# Patient Record
Sex: Female | Born: 1950
Health system: Southern US, Community
[De-identification: ages and names within clinical notes are randomized; demographics above are authoritative.]

## PROBLEM LIST (undated history)

## (undated) DIAGNOSIS — G40909 Epilepsy, unspecified, not intractable, without status epilepticus: Secondary | ICD-10-CM

## (undated) DIAGNOSIS — Z9289 Personal history of other medical treatment: Secondary | ICD-10-CM

## (undated) DIAGNOSIS — K219 Gastro-esophageal reflux disease without esophagitis: Secondary | ICD-10-CM

## (undated) DIAGNOSIS — M19042 Primary osteoarthritis, left hand: Secondary | ICD-10-CM

## (undated) DIAGNOSIS — R011 Cardiac murmur, unspecified: Secondary | ICD-10-CM

## (undated) DIAGNOSIS — K589 Irritable bowel syndrome without diarrhea: Secondary | ICD-10-CM

## (undated) DIAGNOSIS — E785 Hyperlipidemia, unspecified: Secondary | ICD-10-CM

## (undated) DIAGNOSIS — Z9089 Acquired absence of other organs: Secondary | ICD-10-CM

## (undated) DIAGNOSIS — F419 Anxiety disorder, unspecified: Secondary | ICD-10-CM

## (undated) DIAGNOSIS — M19041 Primary osteoarthritis, right hand: Secondary | ICD-10-CM

## (undated) DIAGNOSIS — E042 Nontoxic multinodular goiter: Secondary | ICD-10-CM

## (undated) DIAGNOSIS — J309 Allergic rhinitis, unspecified: Secondary | ICD-10-CM

## (undated) DIAGNOSIS — I10 Essential (primary) hypertension: Secondary | ICD-10-CM

## (undated) DIAGNOSIS — E890 Postprocedural hypothyroidism: Secondary | ICD-10-CM

## (undated) HISTORY — DX: Gastro-esophageal reflux disease without esophagitis: K21.9

## (undated) HISTORY — DX: Allergic rhinitis, unspecified: J30.9

## (undated) HISTORY — DX: Postprocedural hypothyroidism: E89.0

## (undated) HISTORY — DX: Nontoxic multinodular goiter: E04.2

## (undated) HISTORY — DX: Epilepsy, unspecified, not intractable, without status epilepticus: G40.909

## (undated) HISTORY — DX: Essential (primary) hypertension: I10

## (undated) HISTORY — DX: Irritable bowel syndrome, unspecified: K58.9

## (undated) HISTORY — DX: Personal history of other medical treatment: Z92.89

## (undated) HISTORY — DX: Hyperlipidemia, unspecified: E78.5

## (undated) HISTORY — DX: Cardiac murmur, unspecified: R01.1

## (undated) HISTORY — DX: Acquired absence of other organs: Z90.89

## (undated) HISTORY — PX: COLONOSCOPY: SHX174

## (undated) HISTORY — DX: Anxiety disorder, unspecified: F41.9

## (undated) HISTORY — DX: Primary osteoarthritis, left hand: M19.042

## (undated) HISTORY — DX: Primary osteoarthritis, right hand: M19.041

---

## 1972-06-16 HISTORY — PX: OTHER SURGICAL HISTORY: SHX169

## 1975-06-17 DIAGNOSIS — G40909 Epilepsy, unspecified, not intractable, without status epilepticus: Secondary | ICD-10-CM

## 1975-06-17 HISTORY — DX: Epilepsy, unspecified, not intractable, without status epilepticus: G40.909

## 1999-06-12 ENCOUNTER — Other Ambulatory Visit: Admission: RE | Admit: 1999-06-12 | Discharge: 1999-06-12 | Payer: Self-pay | Admitting: Obstetrics & Gynecology

## 1999-11-08 ENCOUNTER — Encounter: Payer: Self-pay | Admitting: Internal Medicine

## 1999-11-27 ENCOUNTER — Other Ambulatory Visit: Admission: RE | Admit: 1999-11-27 | Discharge: 1999-11-27 | Payer: Self-pay | Admitting: Gastroenterology

## 1999-12-23 ENCOUNTER — Encounter: Admission: RE | Admit: 1999-12-23 | Discharge: 2000-03-22 | Payer: Self-pay | Admitting: Gastroenterology

## 2000-08-14 ENCOUNTER — Other Ambulatory Visit: Admission: RE | Admit: 2000-08-14 | Discharge: 2000-08-14 | Payer: Self-pay | Admitting: Obstetrics & Gynecology

## 2001-10-07 ENCOUNTER — Other Ambulatory Visit: Admission: RE | Admit: 2001-10-07 | Discharge: 2001-10-07 | Payer: Self-pay | Admitting: Obstetrics & Gynecology

## 2002-10-17 ENCOUNTER — Other Ambulatory Visit: Admission: RE | Admit: 2002-10-17 | Discharge: 2002-10-17 | Payer: Self-pay | Admitting: Obstetrics & Gynecology

## 2003-06-15 ENCOUNTER — Encounter: Admission: RE | Admit: 2003-06-15 | Discharge: 2003-06-15 | Payer: Self-pay | Admitting: Internal Medicine

## 2003-11-23 ENCOUNTER — Other Ambulatory Visit: Admission: RE | Admit: 2003-11-23 | Discharge: 2003-11-23 | Payer: Self-pay | Admitting: Obstetrics & Gynecology

## 2004-05-01 ENCOUNTER — Ambulatory Visit: Payer: Self-pay | Admitting: Internal Medicine

## 2004-05-27 ENCOUNTER — Ambulatory Visit: Payer: Self-pay | Admitting: Internal Medicine

## 2004-06-07 ENCOUNTER — Ambulatory Visit: Payer: Self-pay | Admitting: Gastroenterology

## 2004-10-29 ENCOUNTER — Ambulatory Visit: Payer: Self-pay | Admitting: Internal Medicine

## 2005-01-21 ENCOUNTER — Ambulatory Visit: Payer: Self-pay | Admitting: Gastroenterology

## 2005-03-25 ENCOUNTER — Other Ambulatory Visit: Admission: RE | Admit: 2005-03-25 | Discharge: 2005-03-25 | Payer: Self-pay | Admitting: Obstetrics & Gynecology

## 2005-09-15 ENCOUNTER — Ambulatory Visit: Payer: Self-pay | Admitting: Gastroenterology

## 2005-10-01 ENCOUNTER — Ambulatory Visit: Payer: Self-pay | Admitting: Gastroenterology

## 2006-06-16 HISTORY — PX: OTHER SURGICAL HISTORY: SHX169

## 2006-09-16 ENCOUNTER — Ambulatory Visit: Payer: Self-pay | Admitting: Internal Medicine

## 2006-09-16 LAB — CONVERTED CEMR LAB
Free T4: 0.6 ng/dL (ref 0.6–1.6)
T3, Free: 3.3 pg/mL (ref 2.3–4.2)
TSH: 1.84 microintl units/mL (ref 0.35–5.50)

## 2006-12-16 ENCOUNTER — Encounter: Payer: Self-pay | Admitting: Internal Medicine

## 2007-03-09 ENCOUNTER — Ambulatory Visit: Payer: Self-pay | Admitting: Internal Medicine

## 2007-03-09 DIAGNOSIS — E049 Nontoxic goiter, unspecified: Secondary | ICD-10-CM | POA: Insufficient documentation

## 2007-03-09 DIAGNOSIS — J309 Allergic rhinitis, unspecified: Secondary | ICD-10-CM | POA: Insufficient documentation

## 2007-03-12 ENCOUNTER — Encounter (INDEPENDENT_AMBULATORY_CARE_PROVIDER_SITE_OTHER): Payer: Self-pay | Admitting: *Deleted

## 2007-03-23 ENCOUNTER — Encounter: Admission: RE | Admit: 2007-03-23 | Discharge: 2007-03-23 | Payer: Self-pay | Admitting: Internal Medicine

## 2007-03-24 ENCOUNTER — Encounter (INDEPENDENT_AMBULATORY_CARE_PROVIDER_SITE_OTHER): Payer: Self-pay | Admitting: *Deleted

## 2007-04-05 ENCOUNTER — Ambulatory Visit: Payer: Self-pay | Admitting: Internal Medicine

## 2007-04-06 ENCOUNTER — Encounter (INDEPENDENT_AMBULATORY_CARE_PROVIDER_SITE_OTHER): Payer: Self-pay | Admitting: *Deleted

## 2007-05-27 ENCOUNTER — Telehealth (INDEPENDENT_AMBULATORY_CARE_PROVIDER_SITE_OTHER): Payer: Self-pay | Admitting: *Deleted

## 2007-08-20 ENCOUNTER — Ambulatory Visit: Payer: Self-pay | Admitting: Internal Medicine

## 2007-08-20 DIAGNOSIS — D649 Anemia, unspecified: Secondary | ICD-10-CM

## 2007-08-24 LAB — CONVERTED CEMR LAB
Basophils Absolute: 0 10*3/uL (ref 0.0–0.1)
Basophils Relative: 0.3 % (ref 0.0–1.0)
Eosinophils Absolute: 0.1 10*3/uL (ref 0.0–0.6)
Eosinophils Relative: 1.5 % (ref 0.0–5.0)
Folate: 17.6 ng/mL
HCT: 33.2 % — ABNORMAL LOW (ref 36.0–46.0)
Hemoglobin: 11 g/dL — ABNORMAL LOW (ref 12.0–15.0)
Iron: 79 ug/dL (ref 42–145)
Lymphocytes Relative: 34.9 % (ref 12.0–46.0)
MCHC: 33 g/dL (ref 30.0–36.0)
MCV: 87.2 fL (ref 78.0–100.0)
Monocytes Absolute: 0.3 10*3/uL (ref 0.2–0.7)
Monocytes Relative: 8 % (ref 3.0–11.0)
Neutro Abs: 2.4 10*3/uL (ref 1.4–7.7)
Neutrophils Relative %: 55.3 % (ref 43.0–77.0)
Platelets: 190 10*3/uL (ref 150–400)
RBC: 3.81 M/uL — ABNORMAL LOW (ref 3.87–5.11)
RDW: 11.7 % (ref 11.5–14.6)
Saturation Ratios: 26.3 % (ref 20.0–50.0)
Transferrin: 214.4 mg/dL (ref >=212.0)
Vitamin B-12: 427 pg/mL (ref 211–911)
WBC: 4.3 10*3/uL — ABNORMAL LOW (ref 4.5–10.5)

## 2007-08-25 ENCOUNTER — Encounter (INDEPENDENT_AMBULATORY_CARE_PROVIDER_SITE_OTHER): Payer: Self-pay | Admitting: *Deleted

## 2007-11-16 ENCOUNTER — Ambulatory Visit: Payer: Self-pay | Admitting: Internal Medicine

## 2007-11-16 ENCOUNTER — Telehealth (INDEPENDENT_AMBULATORY_CARE_PROVIDER_SITE_OTHER): Payer: Self-pay | Admitting: *Deleted

## 2007-11-16 DIAGNOSIS — R002 Palpitations: Secondary | ICD-10-CM

## 2007-11-17 ENCOUNTER — Encounter (INDEPENDENT_AMBULATORY_CARE_PROVIDER_SITE_OTHER): Payer: Self-pay | Admitting: *Deleted

## 2007-11-17 ENCOUNTER — Telehealth: Payer: Self-pay | Admitting: Internal Medicine

## 2007-11-29 ENCOUNTER — Ambulatory Visit: Payer: Self-pay

## 2007-11-29 ENCOUNTER — Encounter: Payer: Self-pay | Admitting: Internal Medicine

## 2007-11-29 ENCOUNTER — Telehealth: Payer: Self-pay | Admitting: Internal Medicine

## 2008-02-01 ENCOUNTER — Encounter: Payer: Self-pay | Admitting: Internal Medicine

## 2008-03-15 ENCOUNTER — Ambulatory Visit: Payer: Self-pay | Admitting: Internal Medicine

## 2008-03-15 DIAGNOSIS — Z8659 Personal history of other mental and behavioral disorders: Secondary | ICD-10-CM

## 2008-03-15 DIAGNOSIS — K219 Gastro-esophageal reflux disease without esophagitis: Secondary | ICD-10-CM

## 2008-03-15 DIAGNOSIS — D568 Other thalassemias: Secondary | ICD-10-CM

## 2008-03-15 DIAGNOSIS — E785 Hyperlipidemia, unspecified: Secondary | ICD-10-CM | POA: Insufficient documentation

## 2008-03-15 LAB — CONVERTED CEMR LAB
Cholesterol, target level: 200 mg/dL
HDL goal, serum: 40 mg/dL
LDL Goal: 160 mg/dL

## 2008-03-24 ENCOUNTER — Encounter (INDEPENDENT_AMBULATORY_CARE_PROVIDER_SITE_OTHER): Payer: Self-pay | Admitting: *Deleted

## 2008-03-24 LAB — CONVERTED CEMR LAB
ALT: 24 units/L (ref 0–35)
AST: 32 units/L (ref 0–37)
Albumin: 3.9 g/dL (ref 3.5–5.2)
Alkaline Phosphatase: 61 units/L (ref 39–117)
BUN: 19 mg/dL (ref 6–23)
Basophils Absolute: 0 10*3/uL (ref 0.0–0.1)
Basophils Relative: 0.8 % (ref 0.0–3.0)
Bilirubin, Direct: 0.1 mg/dL (ref 0.0–0.3)
CO2: 27 meq/L (ref 19–32)
Calcium: 9.3 mg/dL (ref 8.4–10.5)
Chloride: 102 meq/L (ref 96–112)
Cholesterol: 239 mg/dL (ref 0–200)
Creatinine, Ser: 0.8 mg/dL (ref 0.4–1.2)
Direct LDL: 168.1 mg/dL
Eosinophils Absolute: 0.1 10*3/uL (ref 0.0–0.7)
Eosinophils Relative: 1.6 % (ref 0.0–5.0)
Free T4: 0.8 ng/dL (ref 0.6–1.6)
GFR calc Af Amer: 94 mL/min
GFR calc non Af Amer: 78 mL/min
Glucose, Bld: 91 mg/dL (ref 70–99)
HCT: 35.6 % — ABNORMAL LOW (ref 36.0–46.0)
HDL: 53.7 mg/dL (ref >39.0)
Hemoglobin: 12.4 g/dL (ref 12.0–15.0)
Lymphocytes Relative: 35.1 % (ref 12.0–46.0)
MCHC: 34.8 g/dL (ref 30.0–36.0)
MCV: 86.3 fL (ref 78.0–100.0)
Monocytes Absolute: 0.4 10*3/uL (ref 0.1–1.0)
Monocytes Relative: 8.2 % (ref 3.0–12.0)
Neutro Abs: 2.6 10*3/uL (ref 1.4–7.7)
Neutrophils Relative %: 54.3 % (ref 43.0–77.0)
Platelets: 215 10*3/uL (ref 150–400)
Potassium: 4.5 meq/L (ref 3.5–5.1)
RBC: 4.13 M/uL (ref 3.87–5.11)
RDW: 11.7 % (ref 11.5–14.6)
Sodium: 138 meq/L (ref 135–145)
T3, Free: 3 pg/mL (ref 2.3–4.2)
TSH: 2.55 microintl units/mL (ref 0.35–5.50)
Total Bilirubin: 0.6 mg/dL (ref 0.3–1.2)
Total CHOL/HDL Ratio: 4.5
Total Protein: 7.4 g/dL (ref 6.0–8.3)
Triglycerides: 88 mg/dL (ref 0–149)
VLDL: 18 mg/dL (ref 0–40)
WBC: 4.8 10*3/uL (ref 4.5–10.5)

## 2008-04-03 ENCOUNTER — Encounter: Admission: RE | Admit: 2008-04-03 | Discharge: 2008-04-03 | Payer: Self-pay | Admitting: Internal Medicine

## 2008-04-06 ENCOUNTER — Telehealth (INDEPENDENT_AMBULATORY_CARE_PROVIDER_SITE_OTHER): Payer: Self-pay | Admitting: *Deleted

## 2008-05-01 ENCOUNTER — Ambulatory Visit: Payer: Self-pay | Admitting: Internal Medicine

## 2008-05-09 ENCOUNTER — Encounter (INDEPENDENT_AMBULATORY_CARE_PROVIDER_SITE_OTHER): Payer: Self-pay | Admitting: *Deleted

## 2008-05-09 LAB — CONVERTED CEMR LAB
Basophils Absolute: 0 10*3/uL (ref 0.0–0.1)
Basophils Relative: 0.8 % (ref 0.0–3.0)
Eosinophils Absolute: 0.1 10*3/uL (ref 0.0–0.7)
Eosinophils Relative: 1.2 % (ref 0.0–5.0)
Folate: >20 ng/mL
HCT: 32.7 % — ABNORMAL LOW (ref 36.0–46.0)
Hemoglobin: 11.4 g/dL — ABNORMAL LOW (ref 12.0–15.0)
Iron: 60 ug/dL (ref 42–145)
Lymphocytes Relative: 31.5 % (ref 12.0–46.0)
MCHC: 34.9 g/dL (ref 30.0–36.0)
MCV: 87.1 fL (ref 78.0–100.0)
Monocytes Absolute: 0.4 10*3/uL (ref 0.1–1.0)
Monocytes Relative: 7.9 % (ref 3.0–12.0)
Neutro Abs: 2.9 10*3/uL (ref 1.4–7.7)
Neutrophils Relative %: 58.6 % (ref 43.0–77.0)
Platelets: 176 10*3/uL (ref 150–400)
RBC: 3.75 M/uL — ABNORMAL LOW (ref 3.87–5.11)
RDW: 11.6 % (ref 11.5–14.6)
Vitamin B-12: 370 pg/mL (ref 211–911)
WBC: 5 10*3/uL (ref 4.5–10.5)

## 2008-06-01 ENCOUNTER — Ambulatory Visit: Payer: Self-pay | Admitting: Internal Medicine

## 2008-06-15 ENCOUNTER — Telehealth (INDEPENDENT_AMBULATORY_CARE_PROVIDER_SITE_OTHER): Payer: Self-pay | Admitting: *Deleted

## 2008-09-08 ENCOUNTER — Telehealth (INDEPENDENT_AMBULATORY_CARE_PROVIDER_SITE_OTHER): Payer: Self-pay | Admitting: *Deleted

## 2008-12-12 ENCOUNTER — Ambulatory Visit: Payer: Self-pay | Admitting: Internal Medicine

## 2009-02-05 ENCOUNTER — Telehealth (INDEPENDENT_AMBULATORY_CARE_PROVIDER_SITE_OTHER): Payer: Self-pay | Admitting: *Deleted

## 2009-02-09 ENCOUNTER — Telehealth (INDEPENDENT_AMBULATORY_CARE_PROVIDER_SITE_OTHER): Payer: Self-pay | Admitting: *Deleted

## 2009-07-23 ENCOUNTER — Ambulatory Visit: Payer: Self-pay | Admitting: Internal Medicine

## 2009-07-23 ENCOUNTER — Encounter: Payer: Self-pay | Admitting: Family Medicine

## 2010-03-20 ENCOUNTER — Encounter: Payer: Self-pay | Admitting: Internal Medicine

## 2010-07-05 ENCOUNTER — Ambulatory Visit
Admission: RE | Admit: 2010-07-05 | Discharge: 2010-07-05 | Payer: Self-pay | Source: Home / Self Care | Attending: Internal Medicine | Admitting: Internal Medicine

## 2010-07-05 ENCOUNTER — Encounter: Payer: Self-pay | Admitting: Internal Medicine

## 2010-07-05 DIAGNOSIS — R569 Unspecified convulsions: Secondary | ICD-10-CM | POA: Insufficient documentation

## 2010-07-05 DIAGNOSIS — H9319 Tinnitus, unspecified ear: Secondary | ICD-10-CM | POA: Insufficient documentation

## 2010-07-05 DIAGNOSIS — R1319 Other dysphagia: Secondary | ICD-10-CM | POA: Insufficient documentation

## 2010-07-05 DIAGNOSIS — M531 Cervicobrachial syndrome: Secondary | ICD-10-CM | POA: Insufficient documentation

## 2010-07-08 LAB — CONVERTED CEMR LAB
BUN: 18 mg/dL (ref 6–23)
CO2: 25 meq/L (ref 19–32)
Calcium: 9.6 mg/dL (ref 8.4–10.5)
Chloride: 104 meq/L (ref 96–112)
Creatinine, Ser: 0.72 mg/dL (ref 0.40–1.20)
Free T4: 0.93 ng/dL (ref 0.80–1.80)
Glucose, Bld: 90 mg/dL (ref 70–99)
Potassium: 4.4 meq/L (ref 3.5–5.3)
Sodium: 139 meq/L (ref 135–145)
TSH: 4.065 microintl units/mL (ref 0.350–4.500)
Vitamin B-12: 451 pg/mL (ref 211–911)

## 2010-07-14 LAB — CONVERTED CEMR LAB
ALT: 19 units/L (ref 0–35)
AST: 26 units/L (ref 0–37)
Albumin: 3.8 g/dL (ref 3.5–5.2)
Alkaline Phosphatase: 62 units/L (ref 39–117)
BUN: 15 mg/dL (ref 6–23)
BUN: 16 mg/dL (ref 6–23)
Basophils Absolute: 0 10*3/uL (ref 0.0–0.1)
Basophils Absolute: 0.1 10*3/uL (ref 0.0–0.1)
Basophils Relative: 0.5 % (ref 0.0–1.0)
Basophils Relative: 1 % (ref 0.0–1.0)
Bilirubin, Direct: 0.1 mg/dL (ref 0.0–0.3)
CO2: 31 meq/L (ref 19–32)
CO2: 31 meq/L (ref 19–32)
Calcium: 8.6 mg/dL (ref 8.4–10.5)
Calcium: 9.7 mg/dL (ref 8.4–10.5)
Chloride: 105 meq/L (ref 96–112)
Chloride: 114 meq/L — ABNORMAL HIGH (ref 96–112)
Cholesterol: 218 mg/dL (ref 0–200)
Creatinine, Ser: 0.8 mg/dL (ref 0.4–1.2)
Creatinine, Ser: 1 mg/dL (ref 0.4–1.2)
Direct LDL: 144.9 mg/dL
Eosinophils Absolute: 0.1 10*3/uL (ref 0.0–0.6)
Eosinophils Absolute: 0.1 10*3/uL (ref 0.0–0.7)
Eosinophils Relative: 0.9 % (ref 0.0–5.0)
Eosinophils Relative: 1.4 % (ref 0.0–5.0)
Free T4: 0.6 ng/dL (ref 0.6–1.6)
Free T4: 0.8 ng/dL (ref 0.6–1.6)
GFR calc Af Amer: 73 mL/min
GFR calc Af Amer: 95 mL/min
GFR calc non Af Amer: 60 mL/min
GFR calc non Af Amer: 78 mL/min
Glucose, Bld: 83 mg/dL (ref 70–99)
Glucose, Bld: 98 mg/dL (ref 70–99)
HCT: 34 % — ABNORMAL LOW (ref 36.0–46.0)
HCT: 34 % — ABNORMAL LOW (ref 36.0–46.0)
HDL: 50.8 mg/dL (ref >39.0)
Hemoglobin: 11.8 g/dL — ABNORMAL LOW (ref 12.0–15.0)
Hemoglobin: 12.1 g/dL (ref 12.0–15.0)
Lymphocytes Relative: 32.7 % (ref 12.0–46.0)
Lymphocytes Relative: 34.5 % (ref 12.0–46.0)
MCHC: 34.7 g/dL (ref 30.0–36.0)
MCHC: 35.5 g/dL (ref 30.0–36.0)
MCV: 85.8 fL (ref 78.0–100.0)
MCV: 86.7 fL (ref 78.0–100.0)
Monocytes Absolute: 0.1 10*3/uL (ref 0.1–1.0)
Monocytes Absolute: 0.5 10*3/uL (ref 0.2–0.7)
Monocytes Relative: 2 % — ABNORMAL LOW (ref 3.0–12.0)
Monocytes Relative: 9 % (ref 3.0–11.0)
Neutro Abs: 2.8 10*3/uL (ref 1.4–7.7)
Neutro Abs: 3.7 10*3/uL (ref 1.4–7.7)
Neutrophils Relative %: 54.6 % (ref 43.0–77.0)
Neutrophils Relative %: 63.4 % (ref 43.0–77.0)
Platelets: 211 10*3/uL (ref 150–400)
Platelets: 240 10*3/uL (ref 150–400)
Potassium: 4.2 meq/L (ref 3.5–5.1)
Potassium: 4.5 meq/L (ref 3.5–5.1)
RBC: 3.92 M/uL (ref 3.87–5.11)
RBC: 3.97 M/uL (ref 3.87–5.11)
RDW: 11.6 % (ref 11.5–14.6)
RDW: 12.1 % (ref 11.5–14.6)
Sodium: 137 meq/L (ref 135–145)
Sodium: 141 meq/L (ref 135–145)
T3, Free: 2.6 pg/mL (ref 2.3–4.2)
T3, Free: 2.8 pg/mL (ref 2.3–4.2)
TSH: 2 microintl units/mL (ref 0.35–5.50)
TSH: 3.2 microintl units/mL (ref 0.35–5.50)
Total Bilirubin: 0.6 mg/dL (ref 0.3–1.2)
Total CHOL/HDL Ratio: 4.3
Total Protein: 7.2 g/dL (ref 6.0–8.3)
Triglycerides: 71 mg/dL (ref 0–149)
VLDL: 14 mg/dL (ref 0–40)
WBC: 5.2 10*3/uL (ref 4.5–10.5)
WBC: 6 10*3/uL (ref 4.5–10.5)

## 2010-07-16 NOTE — Letter (Signed)
Summary: [facility]  [facility]   Imported By: Lanelle Bal 05/07/2010 08:37:25  _____________________________________________________________________  External Attachment:    Type:   Image     Comment:   External Document

## 2010-07-18 NOTE — Assessment & Plan Note (Addendum)
Summary: ringing in ears//lch   Vital Signs:  Patient profile:   60 year old female Weight:      182.2 pounds BMI:     32.14 Temp:     98.2 degrees F oral Pulse rate:   72 / minute Resp:     15 per minute BP sitting:   138 / 84  (left arm) Cuff size:   large  Vitals Entered By: Shonna Chock CMA (July 05, 2010 2:50 PM) CC: Sound in ears (not really ringing) and sensation up the back of head. FYI- patient with seizure like episodes about 25years ago- and what she is experiencing now is kinda similar to symptoms that presented then, Syncope   CC:  Sound in ears (not really ringing) and sensation up the back of head. FYI- patient with seizure like episodes about 25years ago- and what she is experiencing now is kinda similar to symptoms that presented then and Syncope.  History of Present Illness:    Tinnitus as "hissing " in both ears since 02/2010.Sensation of " electrical wire going " up the back of head intermittently  since 04/2010 . It is more frequent inpast month. Twenty five years ago she had dizziness ; EEG revealed "seizures" .Dr Ellis Savage treated her with ? Xanax for brief period. The patient reports lightheadedness, but denies loss of consciousness, near loss of consciousness, palpitations, chest pain, and incontinence.  The patient denies the following symptoms: headache, abdominal discomfort, nausea, vomiting, feeling warm, pallor, diaphoresis, focal weakness, blurred vision, perioral numbness, bite injury of tongue, witnessed limb jerking, and witnessed confusion on awakening.  The patient reports  no precipitating factors except possibly anxiety. She weaned off SSRI  w/o impact on symptoms. No PMH of head trauma. No vertigo or BPV.  Current Medications (verified): 1)  Vit B-12 2)  Flax Seed Oil 3)  Multivitamin  Allergies: 1)  ! * Acromycin  Past History:  Past Medical History: Goiter; IBS Allergic rhinitis GERD Hyperlipidemia Seizure disorder ? 1977  Past Surgical  History: G0 P0; bladder tacking; Uterine polypectomy 12/08 , Dr Jennette Kettle, Gyn Colonoscopy 2003, 2007 negative  except Int Hemorrhoids  Review of Systems ENT:  Complains of difficulty swallowing; denies decreased hearing, earache, and hoarseness; Occasional swallowing issues .  Physical Exam  General:  well-nourished,in no acute distress; alert,appropriate and cooperative throughout examination Head:  Normocephalic and atraumatic without obvious abnormalities.  Eyes:  No corneal or conjunctival inflammation noted. EOMI. Perrla. Field of  Vision grossly normal. Ears:  External ear exam shows no significant lesions or deformities.  Otoscopic examination reveals clear canals, tympanic membranes are intact bilaterally without bulging, retraction, inflammation or discharge. Hearing is grossly normal bilaterally.Rinne normal (BC>AC) and Weber abnormal, lateralization to L   Nose:  External nasal examination shows no deformity or inflammation. Nasal mucosa are pink and moist without lesions or exudates. Mouth:  Oral mucosa and oropharynx without lesions or exudates.  Teeth in good repair. Neck:  Large goiter Heart:  normal rate, regular rhythm, no gallop, no rub, no JVD, and grade 1/2-1  /6 systolic murmur.   Pulses:  R and L carotid  pulses are full and equal bilaterally Extremities:  No clubbing, cyanosis, edema. Neurologic:  alert & oriented X3, cranial nerves II-XII intact, strength normal in all extremities, sensation intact to light touch, gait normal, DTRs symmetrical and normal, finger-to-nose normal, and Romberg negative.  No tremor Skin:  Intact without suspicious lesions or rashes Cervical Nodes:  No lymphadenopathy noted Axillary Nodes:  No palpable lymphadenopathy Psych:  memory intact for recent and remote, normally interactive, good eye contact, not anxious appearing, and not depressed appearing.     Impression & Recommendations:  Problem # 1:  TINNITUS (ICD-388.30)  Tuning fork  lateralization to L  Orders: Neurology Referral (Neuro)  Problem # 2:  OTHER SYNDROMES AFFECTING CERVICAL REGION (ICD-723.8)  ? occipital neuritis variant. PMH of ? "seizure" responsive to Xanax  Orders: Venipuncture (66440) TLB-BMP (Basic Metabolic Panel-BMET) (80048-METABOL) TLB-TSH (Thyroid Stimulating Hormone) (84443-TSH) TLB-B12, Serum-Total ONLY (34742-V95) T-RPR (Syphilis) [phone]) Neurology Referral (Neuro)  Problem # 3:  GOITER NOS (ICD-240.9)  Orders: TLB-TSH (Thyroid Stimulating Hormone) (84443-TSH) TLB-T4 (Thyrox), Free [phone])  Problem # 4:  OTHER DYSPHAGIA (ICD-787.29)  Orders: TLB-CBC Platelet - w/Differential (85025-CBCD)  Complete Medication List: 1)  Vit B-12  2)  Flax Seed Oil  3)  Multivitamin  4)  Diazepam 2 Mg Tabs (Diazepam) .Marland Kitchen.. 1 every 8 -12 hrs as needed for symptoms  Patient Instructions: 1)  Keep Diary of symptoms & response to meds. Prescriptions: DIAZEPAM 2 MG TABS (DIAZEPAM) 1 every 8 -12 hrs as needed for symptoms  #30 x 0   Entered and Authorized by:   Marga Melnick MD   Signed by:   Marga Melnick MD on 07/05/2010   Method used:   Print then Give to Patient   RxID:   [id number]    Orders Added: 1)  Est. Patient Level IV [32202] 2)  Venipuncture [54270] 3)  TLB-BMP (Basic Metabolic Panel-BMET) [80048-METABOL] 4)  TLB-CBC Platelet - w/Differential [85025-CBCD] 5)  TLB-TSH (Thyroid Stimulating Hormone) [84443-TSH] 6)  TLB-T4 (Thyrox), Free [62376-EG3T] 7)  TLB-B12, Serum-Total ONLY [82607-B12] 8)  T-RPR (Syphilis) [51761-60737] 9)  Neurology Referral [Neuro]  Appended Document: ringing in ears//lch

## 2010-08-21 ENCOUNTER — Encounter: Payer: Self-pay | Admitting: Internal Medicine

## 2010-08-22 ENCOUNTER — Encounter: Payer: Self-pay | Admitting: Internal Medicine

## 2010-09-03 NOTE — Consult Note (Signed)
Summary: [facility]  [facility]   Imported By: Maryln Gottron 08/29/2010 09:59:09  _____________________________________________________________________  External Attachment:    Type:   Image     Comment:   External Document

## 2010-09-03 NOTE — Letter (Signed)
Summary: [facility]   Imported By: Kassie Mends 08/29/2010 08:32:15  _____________________________________________________________________  External Attachment:    Type:   Image     Comment:   External Document

## 2011-02-06 ENCOUNTER — Telehealth: Payer: Self-pay

## 2011-02-06 NOTE — Telephone Encounter (Signed)
Patient called to request stress test due to recent information of women dropping dead with heart-related issues. Patient states this is just something she would like to do as a precaution. I informed patient we will need proper diagnosis to have stress test covered by insurance. Patient was offered appointment tomorrow to further discuss with Dr.Hopper, patient indicated next week will work better. Patient with pending appointment for Tuesday

## 2011-02-11 ENCOUNTER — Ambulatory Visit: Payer: Self-pay | Admitting: Internal Medicine

## 2011-12-03 ENCOUNTER — Ambulatory Visit (INDEPENDENT_AMBULATORY_CARE_PROVIDER_SITE_OTHER): Payer: 59

## 2011-12-03 DIAGNOSIS — Z2911 Encounter for prophylactic immunotherapy for respiratory syncytial virus (RSV): Secondary | ICD-10-CM

## 2011-12-03 DIAGNOSIS — Z23 Encounter for immunization: Secondary | ICD-10-CM

## 2012-02-17 ENCOUNTER — Ambulatory Visit (INDEPENDENT_AMBULATORY_CARE_PROVIDER_SITE_OTHER): Payer: 59 | Admitting: Internal Medicine

## 2012-02-17 ENCOUNTER — Encounter: Payer: Self-pay | Admitting: Internal Medicine

## 2012-02-17 VITALS — BP 122/78 | HR 53 | Temp 98.1°F | Resp 12 | Ht 62.08 in | Wt 178.4 lb

## 2012-02-17 DIAGNOSIS — Z0000 Encounter for general adult medical examination without abnormal findings: Secondary | ICD-10-CM

## 2012-02-17 DIAGNOSIS — E785 Hyperlipidemia, unspecified: Secondary | ICD-10-CM

## 2012-02-17 DIAGNOSIS — Z23 Encounter for immunization: Secondary | ICD-10-CM

## 2012-02-17 DIAGNOSIS — F411 Generalized anxiety disorder: Secondary | ICD-10-CM

## 2012-02-17 DIAGNOSIS — F419 Anxiety disorder, unspecified: Secondary | ICD-10-CM

## 2012-02-17 DIAGNOSIS — R0789 Other chest pain: Secondary | ICD-10-CM

## 2012-02-17 LAB — CBC WITH DIFFERENTIAL/PLATELET
Basophils Absolute: 0 10*3/uL (ref 0.0–0.1)
Basophils Relative: 0.5 % (ref 0.0–3.0)
Eosinophils Absolute: 0.1 10*3/uL (ref 0.0–0.7)
Eosinophils Relative: 1.3 % (ref 0.0–5.0)
HCT: 36.1 % (ref 36.0–46.0)
Hemoglobin: 11.9 g/dL — ABNORMAL LOW (ref 12.0–15.0)
Lymphocytes Relative: 32.8 % (ref 12.0–46.0)
Lymphs Abs: 1.9 10*3/uL (ref 0.7–4.0)
MCHC: 33 g/dL (ref 30.0–36.0)
MCV: 86.6 fl (ref 78.0–100.0)
Monocytes Absolute: 0.4 10*3/uL (ref 0.1–1.0)
Monocytes Relative: 7.2 % (ref 3.0–12.0)
Neutro Abs: 3.4 10*3/uL (ref 1.4–7.7)
Neutrophils Relative %: 58.2 % (ref 43.0–77.0)
Platelets: 229 10*3/uL (ref 150.0–400.0)
RBC: 4.17 Mil/uL (ref 3.87–5.11)
RDW: 13 % (ref 11.5–14.6)
WBC: 5.8 10*3/uL (ref 4.5–10.5)

## 2012-02-17 LAB — HEPATIC FUNCTION PANEL
ALT: 17 U/L (ref 0–35)
AST: 22 U/L (ref 0–37)
Albumin: 3.9 g/dL (ref 3.5–5.2)
Alkaline Phosphatase: 71 U/L (ref 39–117)
Bilirubin, Direct: 0 mg/dL (ref 0.0–0.3)
Total Bilirubin: 0.5 mg/dL (ref 0.3–1.2)
Total Protein: 7.4 g/dL (ref 6.0–8.3)

## 2012-02-17 LAB — BASIC METABOLIC PANEL
BUN: 17 mg/dL (ref 6–23)
CO2: 28 mEq/L (ref 19–32)
Calcium: 9.6 mg/dL (ref 8.4–10.5)
Chloride: 103 mEq/L (ref 96–112)
Creatinine, Ser: 0.7 mg/dL (ref 0.4–1.2)
GFR: 88.27 mL/min (ref >60.00)
Glucose, Bld: 83 mg/dL (ref 70–99)
Potassium: 3.9 mEq/L (ref 3.5–5.1)
Sodium: 139 mEq/L (ref 135–145)

## 2012-02-17 LAB — LIPID PANEL
Cholesterol: 217 mg/dL — ABNORMAL HIGH (ref 0–200)
HDL: 47.3 mg/dL (ref >39.00)
Total CHOL/HDL Ratio: 5
Triglycerides: 136 mg/dL (ref 0.0–149.0)
VLDL: 27.2 mg/dL (ref 0.0–40.0)

## 2012-02-17 LAB — TSH: TSH: 2.21 u[IU]/mL (ref 0.35–5.50)

## 2012-02-17 LAB — LDL CHOLESTEROL, DIRECT: Direct LDL: 144.3 mg/dL

## 2012-02-17 MED ORDER — DIAZEPAM 2 MG PO TABS: 2.0000 mg | ORAL_TABLET | Freq: Two times a day (BID) | ORAL | Status: DC | PRN

## 2012-02-17 NOTE — Patient Instructions (Addendum)
Preventive Health Care: Exercise  30-45  minutes a day, 3-4 days a week. Walking is especially valuable in preventing Osteoporosis. Eat a low-fat diet with lots of fruits and vegetables, up to 7-9 servings per day.  Consume less than 30 grams of sugar per day from foods & drinks with High Fructose Corn Syrup as #1,2,3 or #4 on label.  If you activate My Chart; the results can be released to you as soon as they populate from the lab. If you choose not to use this program; the labs have to be reviewed, copied & mailed   causing a delay in getting the results to you.  

## 2012-02-17 NOTE — Progress Notes (Signed)
  Subjective:    Patient ID: Renee Lyons, female    DOB: November 30, 1950, 61 y.o.   MRN: [id number]  HPI Renee Lyons   is here for a physical;acute issues include intermittent chest discomfort.     Review of Systems For the last year she has noted  short-lived pulling sensation in the left lateral chest without specific predisposition.This can occur at rest and occurs every  2-3 weeks for seconds. She has noted some exertional dyspnea & substernal burning when she is carrying items. She has no claudication.  Her father had a heart attack at 64. She has had an elevated LDL but advanced cholesterol testing suggests her LDL goal is less than 160 based on particle number.  She's been using over-the-counter Tagamet for reflux symptoms as needed. She specifically denies significant dysphagia, abdominal pain, unexplained weight loss, melena, rectal bleeding.      Objective:   Physical Exam Gen.:  well-nourished in appearance. Alert, appropriate and cooperative throughout exam. Head: Normocephalic without obvious abnormalities  Eyes: No corneal or conjunctival inflammation noted. Pupils equal round reactive to light and accommodation. Fundal exam is benign without hemorrhages, exudate, papilledema. Extraocular motion intact. Vision grossly normal with lenses. Ears: External  ear exam reveals no significant lesions or deformities. Canals clear .TMs normal. Hearing is grossly normal bilaterally. Nose: External nasal exam reveals no deformity or inflammation. Nasal mucosa are pink and moist. No lesions or exudates noted.  Mouth: Oral mucosa and oropharynx reveal no lesions or exudates. Teeth in good repair. Neck: No deformities, masses, or tenderness noted. Range of motion normal. Thyroid : large asymmetric goiter on L. Lungs: Normal respiratory effort; chest expands symmetrically. Lungs are clear to auscultation without rales, wheezes, or increased work of breathing. Heart: Normal rate and rhythm. Normal S1  and S2. No gallop, click, or rub. Grade 1/2 over 6 systolic murmur  Abdomen: Bowel sounds normal; abdomen soft and nontender. No masses, organomegaly or hernias noted. Genitalia: Dr Jennette Kettle, Gyn Musculoskeletal/extremities: Mild lordosis  noted of  the thoracic  spine. No clubbing, cyanosis, edema, or deformity noted. Range of motion  normal .Tone & strength  normal.Joints :minimal DJD changes. Nail health  good. Vascular: Carotid, radial artery, dorsalis pedis and  posterior tibial pulses are full and equal. No bruits present. Neurologic: Alert and oriented x3. Deep tendon reflexes symmetrical and normal.          Skin: Intact without suspicious lesions or rashes.Many keratoses Lymph: No cervical, axillary lymphadenopathy present. Psych: Mood and affect are normal. Normally interactive                                                                                         Assessment & Plan:  #1 comprehensive physical exam; no acute findings #2 see Problem List with Assessments & Recommendations  #3 chest pain, atypical. Of potential concern is the exertional substernal burning associated with dyspnea . EKG suggests early repolarization ST-T changes without ischemia. If labs are normal, specifically absence of significant anemia; cardiology referral will be ordered Plan: see Orders

## 2012-02-18 ENCOUNTER — Other Ambulatory Visit: Payer: Self-pay | Admitting: Internal Medicine

## 2012-02-18 DIAGNOSIS — R0789 Other chest pain: Secondary | ICD-10-CM

## 2012-03-11 ENCOUNTER — Encounter: Payer: Self-pay | Admitting: Cardiovascular Disease

## 2012-03-11 ENCOUNTER — Ambulatory Visit (INDEPENDENT_AMBULATORY_CARE_PROVIDER_SITE_OTHER): Payer: 59 | Admitting: Cardiovascular Disease

## 2012-03-11 VITALS — BP 150/67 | HR 60 | Ht 63.0 in | Wt 178.0 lb

## 2012-03-11 DIAGNOSIS — R079 Chest pain, unspecified: Secondary | ICD-10-CM

## 2012-03-11 NOTE — Progress Notes (Signed)
History of Present Illness: 61 yo female with history of IBS, HLD, GERD, seizure disorder referred as a new patient today by Dr. Alwyn Ren for evaluation of chest pain. She tells me that she has had substernal burning with exercise with associated SOB for 2 years. Over the last few months, this has been more prominent.   Primary Care Physician: Marga Melnick  Last Lipid Profile:Lipid Panel     Component Value Date/Time   CHOL 217* 02/17/2012 1540   TRIG 136.0 02/17/2012 1540   HDL 47.30 02/17/2012 1540   CHOLHDL 5 02/17/2012 1540   VLDL 27.2 02/17/2012 1540  LDL 144   Past Medical History  Diagnosis Date  . Goiter     stable on [place] as per Dr Altheimer  . IBS (irritable bowel syndrome)   . Allergic rhinitis   . GERD (gastroesophageal reflux disease)   . Hyperlipidemia   . Seizure disorder 27     Dr Ellis Savage, Neurology.One episode in 1977    Past Surgical History  Procedure Date  . Uterine polypectomy 2008     Dr Jennette Kettle  . Colonoscopy 2003 & 2007    Dr Jarold Motto  . Bladder tacking 1974    Current Outpatient Prescriptions  Medication Sig Dispense Refill  . diazepam (VALIUM) 2 MG tablet Take 1 tablet (2 mg total) by mouth every 12 (twelve) hours as needed for anxiety. 1 by mouth every 8-12 hours as needed only  30 tablet  1  . Multiple Vitamin (MULTIVITAMIN) tablet Take 1 tablet by mouth daily.        No Known Allergies  History   Social History  . Marital Status: Married    Spouse Name: N/A    Number of Children: 0  . Years of Education: N/A   Occupational History  . Retired, works part-time now for [employer] History Main Topics  . Smoking status: Never Smoker   . Smokeless tobacco: Not on file  . Alcohol Use: 0.5 oz/week    1 drink(s) per week     Rarely  . Drug Use: No  . Sexually Active: Not on file   Other Topics Concern  . Not on file   Social History Narrative  . No narrative on file    Family History  Problem Relation Age of Onset  . Prostate  cancer Father   . Heart attack Father 69  . Hypertension Mother   . Thalassemia Mother   . Thalassemia Maternal Aunt   . Diverticulitis Brother   . Suicidality Brother   . Stroke Neg Hx   . Diabetes Neg Hx     Review of Systems:  As stated in the HPI and otherwise negative.   BP 150/67  Pulse 60  Ht 5\' 3"  (1.6 m)  Wt 178 lb (80.74 kg)  BMI 31.53 kg/m2  Physical Examination: General: Well developed, well nourished, NAD HEENT: OP clear, mucus membranes moist SKIN: warm, dry. No rashes. Neuro: No focal deficits Musculoskeletal: Muscle strength 5/5 all ext Psychiatric: Mood and affect normal Neck: No JVD, no carotid bruits, no thyromegaly, no lymphadenopathy. Lungs:Clear bilaterally, no wheezes, rhonci, crackles Cardiovascular: Regular rate and rhythm. No murmurs, gallops or rubs. Abdomen:Soft. Bowel sounds present. Non-tender.  Extremities: No lower extremity edema. Pulses are 2 + in the bilateral DP/PT.  EKG: Sinus, rate 56 bpm.   Assessment and Plan:   1. Chest pain: She has chest pain and SOB with exertion. Her risk factors for CAD include FH  of CAD and personal history of CAD. Will arrange exercise treadmill stress test to exclude ischemia and echo to evaluate LV function.

## 2012-03-11 NOTE — Patient Instructions (Signed)
Your physician recommends that you schedule a follow-up appointment in:  3-4 weeks.   Your physician has requested that you have an exercise tolerance test. May be scheduled with NP or PA. For further information please visit [email or web address] Please also follow instruction sheet, as given.   Your physician has requested that you have an echocardiogram. Echocardiography is a painless test that uses sound waves to create images of your heart. It provides your doctor with information about the size and shape of your heart and how well your heart's chambers and valves are working. This procedure takes approximately one hour. There are no restrictions for this procedure.

## 2012-03-15 ENCOUNTER — Ambulatory Visit (HOSPITAL_COMMUNITY): Payer: 59 | Attending: Cardiovascular Disease

## 2012-03-15 DIAGNOSIS — R072 Precordial pain: Secondary | ICD-10-CM | POA: Insufficient documentation

## 2012-03-15 DIAGNOSIS — R079 Chest pain, unspecified: Secondary | ICD-10-CM

## 2012-03-15 DIAGNOSIS — I369 Nonrheumatic tricuspid valve disorder, unspecified: Secondary | ICD-10-CM | POA: Insufficient documentation

## 2012-03-15 DIAGNOSIS — I059 Rheumatic mitral valve disease, unspecified: Secondary | ICD-10-CM | POA: Insufficient documentation

## 2012-03-15 NOTE — Progress Notes (Signed)
Echocardiogram performed.  

## 2012-03-17 ENCOUNTER — Telehealth: Payer: Self-pay | Admitting: Cardiovascular Disease

## 2012-03-24 ENCOUNTER — Telehealth: Payer: Self-pay | Admitting: Cardiovascular Disease

## 2012-03-24 NOTE — Telephone Encounter (Signed)
Spoke with pt and reviewed echo results with her.  

## 2012-03-24 NOTE — Telephone Encounter (Signed)
New Problem:    Patient called returning a call.  Patient was unsure who it was or the reason for the call.  Please call back.

## 2012-04-02 ENCOUNTER — Ambulatory Visit (INDEPENDENT_AMBULATORY_CARE_PROVIDER_SITE_OTHER): Payer: 59 | Admitting: Physician Assistant

## 2012-04-02 DIAGNOSIS — R079 Chest pain, unspecified: Secondary | ICD-10-CM

## 2012-04-02 DIAGNOSIS — R9439 Abnormal result of other cardiovascular function study: Secondary | ICD-10-CM

## 2012-04-02 MED ORDER — ASPIRIN EC 81 MG PO TBEC: 81.0000 mg | DELAYED_RELEASE_TABLET | Freq: Every day | ORAL | Status: DC

## 2012-04-02 MED ORDER — NITROGLYCERIN 0.4 MG SL SUBL: 0.4000 mg | SUBLINGUAL_TABLET | SUBLINGUAL | Status: DC | PRN

## 2012-04-02 NOTE — Patient Instructions (Addendum)
Your physician has requested that you have en exercise stress myoview. For further information please visit [email or web address]. Please follow instruction sheet, as given.   

## 2012-04-02 NOTE — Procedures (Signed)
Exercise Treadmill Test  Pre-Exercise Testing Evaluation Rhythm: normal sinus  Rate: 56   PR:  .16 QRS:  .09  QT:  .44 QTc: .42     Test  Exercise Tolerance Test Ordering MD: Melene Muller, MD  Interpreting MD: Tereso Newcomer , PA-C  Unique Test No: 1  Treadmill:  1  Indication for ETT: chest pain - rule out ischemia  Contraindication to ETT: No   Stress Modality: exercise - treadmill  Cardiac Imaging Performed: non   Protocol: standard Bruce - maximal  Max BP:  194/52  Max MPHR (bpm):  157 85% MPR (bpm):  139  MPHR obtained (bpm):  150 % MPHR obtained:  95%  Reached 85% MPHR (min:sec):  3:45 Total Exercise Time (min-sec):  4:40  Workload in METS:  6.6 Borg Scale: 15  Reason ETT Terminated:  angina chest pain    ST Segment Analysis At Rest: normal ST segments - no evidence of significant ST depression With Exercise: significant ischemic ST depression  Other Information Arrhythmia:  No Angina during ETT:  present (1) Quality of ETT:  diagnostic  ETT Interpretation:  abnormal - evidence of ST depression consistent with ischemia  Comments: Poor exercise tolerance. She complained of substernal burning at maximal exercise. Normal BP response to exercise. Anterior ST segment depression consistent with ischemia.   Recommendations: D/w Dr. Verne Carrow  Arrange ETT-Myoview. Keep follow up next week with Dr. Verne Carrow Signed, Tereso Newcomer, PA-C  9:51 AM 04/02/2012

## 2012-04-08 ENCOUNTER — Encounter: Payer: Self-pay | Admitting: Cardiovascular Disease

## 2012-04-08 ENCOUNTER — Ambulatory Visit (INDEPENDENT_AMBULATORY_CARE_PROVIDER_SITE_OTHER): Payer: 59 | Admitting: Cardiovascular Disease

## 2012-04-08 ENCOUNTER — Ambulatory Visit (HOSPITAL_COMMUNITY): Payer: 59 | Attending: Cardiovascular Disease | Admitting: Radiology

## 2012-04-08 VITALS — BP 139/78 | HR 47 | Ht 63.0 in | Wt 176.0 lb

## 2012-04-08 VITALS — BP 150/76 | HR 70 | Ht 64.0 in | Wt 179.0 lb

## 2012-04-08 DIAGNOSIS — R0609 Other forms of dyspnea: Secondary | ICD-10-CM | POA: Insufficient documentation

## 2012-04-08 DIAGNOSIS — R079 Chest pain, unspecified: Secondary | ICD-10-CM

## 2012-04-08 DIAGNOSIS — R0602 Shortness of breath: Secondary | ICD-10-CM

## 2012-04-08 DIAGNOSIS — R0989 Other specified symptoms and signs involving the circulatory and respiratory systems: Secondary | ICD-10-CM | POA: Insufficient documentation

## 2012-04-08 DIAGNOSIS — R9439 Abnormal result of other cardiovascular function study: Secondary | ICD-10-CM

## 2012-04-08 DIAGNOSIS — I10 Essential (primary) hypertension: Secondary | ICD-10-CM

## 2012-04-08 MED ORDER — TECHNETIUM TC 99M SESTAMIBI GENERIC - CARDIOLITE
11.0000 | Freq: Once | INTRAVENOUS | Status: AC | PRN
  Administered 2012-04-08: 11 via INTRAVENOUS

## 2012-04-08 MED ORDER — METOPROLOL TARTRATE 25 MG PO TABS: 25.0000 mg | ORAL_TABLET | Freq: Two times a day (BID) | ORAL | Status: DC

## 2012-04-08 MED ORDER — TECHNETIUM TC 99M SESTAMIBI GENERIC - CARDIOLITE
33.0000 | Freq: Once | INTRAVENOUS | Status: AC | PRN
  Administered 2012-04-08: 33 via INTRAVENOUS

## 2012-04-08 NOTE — Progress Notes (Signed)
[facility] SITE 3 NUCLEAR MED [id number] [place] [address] [phone]  Cardiology Nuclear Med Study  Renee Lyons is a 61 y.o. female     MRN : [id number]     DOB: 1950-10-01  Procedure Date: 04/08/2012  Nuclear Med Background Indication for Stress Test:  Evaluation for Ischemia and Abnormal GXT History:  '01 KGM:[id number], EF=70%; 9/13 Echo:EF=65%, mild LVH; 04/02/12 [id number] Cardiac Risk Factors: Family History - CAD, Lipids and Overweight  Symptoms:  Chest Pain with Exertion (last episode of chest discomfort was about 3-weeks ago), DOE/SOB, Fatigue with Exertion and Rapid HR    Nuclear Pre-Procedure Caffeine/Decaff Intake:  None NPO After: 4:30   Lungs:  Clear. O2 Sat: 98% on room air. IV 0.9% NS with Angio Cath:  22g  IV Site: R Antecubital  IV Started by:  Renee Levan, RN  Chest Size (in):  36 Cup Size: C  Height: 5\' 3"  (1.6 m)  Weight:  176 lb (79.833 kg)  BMI:  Body mass index is 31.18 kg/(m^2). Tech Comments:  N/A    Nuclear Med Study 1 or 2 day study: 1 day  Stress Test Type:  Stress  Reading MD: Renee Miss, MD  Order Authorizing Provider:  Verne Lyons  Resting Radionuclide: Technetium 2m Sestamibi  Resting Radionuclide Dose: 11.0 mCi   Stress Radionuclide:  Technetium 20m Sestamibi  Stress Radionuclide Dose: 33.0 mCi           Stress Protocol Rest HR: 47 Stress HR: 164  Rest BP: 139/78 Stress BP: 225/85  Exercise Time (min): 7:00 METS: 7.7   Predicted Max HR: 157 bpm % Max HR: 104.46 bpm Rate Pressure Product: 25956   Dose of Adenosine (mg):  n/a Dose of Lexiscan: n/a mg  Dose of Atropine (mg): n/a Dose of Dobutamine: n/a mcg/kg/min (at max HR)  Stress Test Technologist: Renee Lyons, CMA-N  Nuclear Technologist:  Renee Lyons, CNMT     Rest Procedure:  Myocardial perfusion imaging was performed at rest 45 minutes following the intravenous administration of Technetium 34m Sestamibi.  Rest  ECG: Sinus bradycardia with poor R-wave progression.  Stress Procedure:  The patient exercised on the treadmill utilizing the Bruce protocol for seven minutes. She then stopped due to dyspnea and a hypertension response of 225/85.  She denied any chest pain.  There were marked ST-T wave changes that quickly resolved and occasional PVC's/PAC's were noted.  Technetium 3m Sestamibi was injected at peak exercise and myocardial perfusion imaging was performed after a brief delay.  Stress ECG: No significant change from baseline ECG and The patient had 1-2 mm ST depression in the inferior and lateral leads at peak exercise.  QPS Raw Data Images:  Normal; no motion artifact; normal heart/lung ratio. Stress Images:  Normal homogeneous uptake in all areas of the myocardium. Rest Images:  Normal homogeneous uptake in all areas of the myocardium. Subtraction (SDS):  No evidence of ischemia. Transient Ischemic Dilatation (Normal <1.22):  0.94 Lung/Heart Ratio (Normal <0.45):  0.27  Quantitative Gated Spect Images QGS EDV:  78 ml QGS ESV:  18 ml  Impression Exercise Capacity:  Fair exercise capacity. BP Response:  Hypertensive blood pressure response. Clinical Symptoms:  Mild chest pain/dyspnea. ECG Impression:  There was 1-2 mm ST depression in the inferior and lateral leads.  These changes resolved quickly during the recovery phase. Comparison with Prior Nuclear Study: No images to compare  Overall Impression:  Normal stress nuclear study.  Her ECG response  was abnormal (similar to her plain GXT last week).  There is no evidence of ischemia.  She does have a hypertensive response to exercise.  The LV function is normal without wall motion abnormalities. LV Ejection Fraction: 77%.  LV Wall Motion:  Normal Wall Motion.    Renee Lyons, Renee Lyons., MD, [facility] 04/08/2012, 3:24 PM Office - [phone] Pager [phone]

## 2012-04-08 NOTE — Patient Instructions (Signed)
Your physician recommends that you schedule a follow-up appointment in: 4-6 weeks   Your physician has recommended you make the following change in your medication:  Start lopressor 25 mg by mouth twice daily

## 2012-04-08 NOTE — Progress Notes (Signed)
History of Present Illness: 61 yo female with history of IBS, HLD, GERD, seizure disorder here today for cardiac follow up. She was seen as a new patient 03/11/12 for evaluation of chest pain. She told me that she has had substernal burning with exercise with associated SOB for 2 years. This had become more frequent recently. Echo 03/15/12 with normal LV size and function, mild LVH, mild MR. Treadmill exercise stress test on 04/02/12 with ST depression, chest pain with exercise. Exercise stress myoview today with no evidence of ischemia. ST depression with exercise. Elevated BP with exercise.   She feels well. No chest pain today with exercise. She does note occasional chest pressure. Overall feels well.   Primary Care Physician: Marga Melnick  Last Lipid Profile:Lipid Panel     Component Value Date/Time   CHOL 217* 02/17/2012 1540   TRIG 136.0 02/17/2012 1540   HDL 47.30 02/17/2012 1540   CHOLHDL 5 02/17/2012 1540   VLDL 27.2 02/17/2012 1540     Past Medical History  Diagnosis Date  . Goiter     stable on [place] as per Dr Altheimer  . IBS (irritable bowel syndrome)   . Allergic rhinitis   . GERD (gastroesophageal reflux disease)   . Hyperlipidemia   . Seizure disorder 33     Dr Ellis Savage, Neurology.One episode in 1977    Past Surgical History  Procedure Date  . Uterine polypectomy 2008     Dr Jennette Kettle  . Colonoscopy 2003 & 2007    Dr Jarold Motto  . Bladder tacking 1974    Current Outpatient Prescriptions  Medication Sig Dispense Refill  . aspirin EC 81 MG tablet Take 1 tablet (81 mg total) by mouth daily.  1 tablet  0  . diazepam (VALIUM) 2 MG tablet Take 1 tablet (2 mg total) by mouth every 12 (twelve) hours as needed for anxiety. 1 by mouth every 8-12 hours as needed only  30 tablet  1  . Multiple Vitamin (MULTIVITAMIN) tablet Take 1 tablet by mouth daily.      . nitroGLYCERIN (NITROSTAT) 0.4 MG SL tablet Place 1 tablet (0.4 mg total) under the tongue every 5 (five) minutes as needed for  chest pain.  25 tablet  1   No current facility-administered medications for this visit.   Facility-Administered Medications Ordered in Other Visits  Medication Dose Route Frequency Provider Last Rate Last Dose  . technetium sestamibi generic (CARDIOLITE) injection 11 milli Curie  11 milli Curie Intravenous Once PRN [name], MD   11 milli Curie at 04/08/12 0740  . technetium sestamibi generic (CARDIOLITE) injection 33 milli Curie  33 milli Curie Intravenous Once PRN [name], MD   33 milli Curie at 04/08/12 1610    Allergies  Allergen Reactions  . Achromycin (Tetracycline)     History   Social History  . Marital Status: Married    Spouse Name: N/A    Number of Children: 0  . Years of Education: N/A   Occupational History  . Retired, works part-time now for [employer] History Main Topics  . Smoking status: Never Smoker   . Smokeless tobacco: Not on file  . Alcohol Use: 0.5 oz/week    1 drink(s) per week     Rarely  . Drug Use: No  . Sexually Active: Not on file   Other Topics Concern  . Not on file   Social History Narrative  . No narrative on file  Family History  Problem Relation Age of Onset  . Prostate cancer Father   . Heart attack Father 25  . Hypertension Mother   . Thalassemia Mother   . Thalassemia Maternal Aunt   . Diverticulitis Brother   . Suicidality Brother   . Stroke Neg Hx   . Diabetes Neg Hx     Review of Systems:  As stated in the HPI and otherwise negative.   BP 150/76  Pulse 70  Ht 5\' 4"  (1.626 m)  Wt 179 lb (81.194 kg)  BMI 30.73 kg/m2  Physical Examination: General: Well developed, well nourished, NAD HEENT: OP clear, mucus membranes moist SKIN: warm, dry. No rashes. Neuro: No focal deficits Musculoskeletal: Muscle strength 5/5 all ext Psychiatric: Mood and affect normal Neck: No JVD, no carotid bruits, no thyromegaly, no lymphadenopathy. Lungs:Clear bilaterally, no wheezes, rhonci,  crackles Cardiovascular: Regular rate and rhythm. No murmurs, gallops or rubs. Abdomen:Soft. Bowel sounds present. Non-tender.  Extremities: No lower extremity edema. Pulses are 2 + in the bilateral DP/PT.  Echo: 03/15/12:  Left ventricle: The cavity size was normal. Wall thickness was increased in a pattern of mild LVH. Systolic function was normal. The estimated ejection fraction was in the range of 55% to 65%. Wall motion was normal; there were no regional wall motion abnormalities. Left ventricular diastolic function parameters were normal. - Mitral valve: Mild regurgitation. - Atrial septum: No defect or patent foramen ovale was identified.  Stress myoview: 04/08/12:  Normal LV function, no ischemia. Full report pending.   Assessment and Plan:   1. Chest pain: Stress test with hypertensive response to exercise, no evidence of ischemia. Her myoview images are completely normal. Most likely that she is having some chest discomfort from her HTN. Will start Lopressor 25 mg po BID. I will see her back in 4 weeks. If we control her BP and she is still having discomfort, will consider cardiac cath.   2. HTN: Elevated. Also elevated with stress testing. Will add beta blocker as above

## 2012-04-09 ENCOUNTER — Encounter: Payer: Self-pay | Admitting: Physician Assistant

## 2012-04-09 DIAGNOSIS — Z9289 Personal history of other medical treatment: Secondary | ICD-10-CM

## 2012-04-09 HISTORY — DX: Personal history of other medical treatment: Z92.89

## 2012-04-13 ENCOUNTER — Telehealth: Payer: Self-pay | Admitting: Cardiovascular Disease

## 2012-04-13 NOTE — Telephone Encounter (Signed)
Spoke with pt and told her it should be OK to take this tonight.

## 2012-04-13 NOTE — Telephone Encounter (Signed)
New Problem:    Patient called in because she is having minor surgery tomorrow and wanted to know if it would be ok to take half a valium tonight because she is on BP medication.  Please call back.

## 2012-05-10 ENCOUNTER — Ambulatory Visit (INDEPENDENT_AMBULATORY_CARE_PROVIDER_SITE_OTHER): Payer: 59 | Admitting: Cardiovascular Disease

## 2012-05-10 ENCOUNTER — Encounter: Payer: Self-pay | Admitting: Cardiovascular Disease

## 2012-05-10 VITALS — BP 158/71 | HR 50 | Ht 63.0 in | Wt 181.0 lb

## 2012-05-10 DIAGNOSIS — I10 Essential (primary) hypertension: Secondary | ICD-10-CM

## 2012-05-10 MED ORDER — AMLODIPINE BESYLATE 10 MG PO TABS: 10.0000 mg | ORAL_TABLET | Freq: Every day | ORAL | Status: DC

## 2012-05-10 NOTE — Progress Notes (Signed)
History of Present Illness: 61 yo female with history of IBS, HLD, GERD, seizure disorder here today for cardiac follow up. She was seen as a new patient 03/11/12 for evaluation of chest pain. She told me that she has had substernal burning with exercise with associated SOB for 2 years. This had become more frequent recently. Echo 03/15/12 with normal LV size and function, mild LVH, mild MR. Treadmill exercise stress test on 04/02/12 with ST depression, chest pain with exercise. Exercise stress myoview 04/08/12  with no evidence of ischemia. ST depression with exercise but no chest pain with exercise. Elevated BP with exercise.  She is here today for followup. She tells today that she is waking up at night and feeling dyspneic. She has had no chest pain or dyspnea with exertion. Overall feeling well.    Primary Care Physician: Marga Melnick  Last Lipid Profile:Lipid Panel     Component Value Date/Time   CHOL 217* 02/17/2012 1540   TRIG 136.0 02/17/2012 1540   HDL 47.30 02/17/2012 1540   CHOLHDL 5 02/17/2012 1540   VLDL 27.2 02/17/2012 1540     Past Medical History  Diagnosis Date  . Goiter     stable on [place] as per Dr Altheimer  . IBS (irritable bowel syndrome)   . Allergic rhinitis   . GERD (gastroesophageal reflux disease)   . Hyperlipidemia   . Seizure disorder 42     Dr Ellis Savage, Neurology.One episode in 1977  . Hx of cardiovascular stress test     a. ETT + for ischemia;  b. ETT-MV:  images with no ischemia, EF  77%; +ECG changes c/w ischemia    Past Surgical History  Procedure Date  . Uterine polypectomy 2008     Dr Jennette Kettle  . Colonoscopy 2003 & 2007    Dr Jarold Motto  . Bladder tacking 1974    Current Outpatient Prescriptions  Medication Sig Dispense Refill  . aspirin EC 81 MG tablet Take 1 tablet (81 mg total) by mouth daily.  1 tablet  0  . diazepam (VALIUM) 2 MG tablet Take 1 tablet (2 mg total) by mouth every 12 (twelve) hours as needed for anxiety. 1 by mouth every 8-12 hours  as needed only  30 tablet  1  . metoprolol tartrate (LOPRESSOR) 25 MG tablet Take 1 tablet (25 mg total) by mouth 2 (two) times daily.  60 tablet  11  . Multiple Vitamin (MULTIVITAMIN) tablet Take 1 tablet by mouth daily.      . nitroGLYCERIN (NITROSTAT) 0.4 MG SL tablet Place 1 tablet (0.4 mg total) under the tongue every 5 (five) minutes as needed for chest pain.  25 tablet  1    Allergies  Allergen Reactions  . Achromycin (Tetracycline)     History   Social History  . Marital Status: Married    Spouse Name: N/A    Number of Children: 0  . Years of Education: N/A   Occupational History  . Retired, works part-time now for [employer] History Main Topics  . Smoking status: Never Smoker   . Smokeless tobacco: Not on file  . Alcohol Use: 0.5 oz/week    1 drink(s) per week     Comment: Rarely  . Drug Use: No  . Sexually Active: Not on file   Other Topics Concern  . Not on file   Social History Narrative  . No narrative on file    Family History  Problem Relation Age of  Onset  . Prostate cancer Father   . Heart attack Father 43  . Hypertension Mother   . Thalassemia Mother   . Thalassemia Maternal Aunt   . Diverticulitis Brother   . Suicidality Brother   . Stroke Neg Hx   . Diabetes Neg Hx     Review of Systems:  As stated in the HPI and otherwise negative.   BP 158/71  Pulse 50  Ht 5\' 3"  (1.6 m)  Wt 181 lb (82.101 kg)  BMI 32.06 kg/m2  Physical Examination: General: Well developed, well nourished, NAD HEENT: OP clear, mucus membranes moist SKIN: warm, dry. No rashes. Neuro: No focal deficits Musculoskeletal: Muscle strength 5/5 all ext Psychiatric: Mood and affect normal Neck: No JVD, no carotid bruits, no thyromegaly, no lymphadenopathy. Lungs:Clear bilaterally, no wheezes, rhonci, crackles Cardiovascular: Regular rate and rhythm. No murmurs, gallops or rubs. Abdomen:Soft. Bowel sounds present. Non-tender.  Extremities: No lower extremity  edema. Pulses are 2 + in the bilateral DP/PT.  Echo: 03/15/12:  Left ventricle: The cavity size was normal. Wall thickness was increased in a pattern of mild LVH. Systolic function was normal. The estimated ejection fraction was in the range of 55% to 65%. Wall motion was normal; there were no regional wall motion abnormalities. Left ventricular diastolic function parameters were normal. - Mitral valve: Mild regurgitation. - Atrial septum: No defect or patent foramen ovale was identified.  Stress myoview: 04/08/12:  Stress Procedure: The patient exercised on the treadmill utilizing the Bruce protocol for seven minutes. She then stopped due to dyspnea and a hypertension response of 225/85. She denied any chest pain. There were marked ST-T wave changes that quickly resolved and occasional PVC's/PAC's were noted. Technetium 70m Sestamibi was injected at peak exercise and myocardial perfusion imaging was performed after a brief delay.  Stress ECG: No significant change from baseline ECG and The patient had 1-2 mm ST depression in the inferior and lateral leads at peak exercise.  QPS  Raw Data Images: Normal; no motion artifact; normal heart/lung ratio.  Stress Images: Normal homogeneous uptake in all areas of the myocardium.  Rest Images: Normal homogeneous uptake in all areas of the myocardium.  Subtraction (SDS): No evidence of ischemia.  Transient Ischemic Dilatation (Normal <1.22): 0.94  Lung/Heart Ratio (Normal <0.45): 0.27  Quantitative Gated Spect Images  QGS EDV: 78 ml  QGS ESV: 18 ml  Impression  Exercise Capacity: Fair exercise capacity.  BP Response: Hypertensive blood pressure response.  Clinical Symptoms: Mild chest pain/dyspnea.  ECG Impression: There was 1-2 mm ST depression in the inferior and lateral leads. These changes resolved quickly during the recovery phase.  Comparison with Prior Nuclear Study: No images to compare  Overall Impression: Normal stress nuclear study.  Her ECG response was abnormal (similar to her plain GXT last week). There is no evidence of ischemia. She does have a hypertensive response to exercise. The LV function is normal without wall motion abnormalities.  LV Ejection Fraction: 77%. LV Wall Motion: Normal Wall Motion.   Assessment and Plan:   1. Chest pain: Stress test with hypertensive response to exercise, no evidence of ischemia. Her myoview images are completely normal. Most likely that she is having some chest discomfort from her HTN. Started Lopressor 25 mg po BID at last visit but her HR is in the low 50s. Will d/c metoprolol and start Norvasc 10 mg po Qdaily.    2. HTN: Stop Lopressor. Start Norvasc. She will get a BP cuff and call with  results next week. Check renal artery dopplers to exclude RAS.

## 2012-05-10 NOTE — Patient Instructions (Signed)
Your physician recommends that you schedule a follow-up appointment in:  6 weeks.   Your physician has requested that you have a renal artery duplex. During this test, an ultrasound is used to evaluate blood flow to the kidneys. Allow one hour for this exam. Do not eat after midnight the day before and avoid carbonated beverages. Take your medications as you usually do.  Your physician has recommended you make the following change in your medication:  Stop lopressor.  Start Norvasc 10 mg by mouth daily.

## 2012-06-11 ENCOUNTER — Encounter (INDEPENDENT_AMBULATORY_CARE_PROVIDER_SITE_OTHER): Payer: 59

## 2012-06-11 DIAGNOSIS — I10 Essential (primary) hypertension: Secondary | ICD-10-CM

## 2012-06-24 ENCOUNTER — Ambulatory Visit: Payer: 59 | Admitting: Cardiovascular Disease

## 2012-07-15 ENCOUNTER — Ambulatory Visit (INDEPENDENT_AMBULATORY_CARE_PROVIDER_SITE_OTHER): Payer: 59 | Admitting: Cardiovascular Disease

## 2012-07-15 ENCOUNTER — Encounter: Payer: Self-pay | Admitting: Cardiovascular Disease

## 2012-07-15 VITALS — BP 132/67 | HR 53 | Ht 63.0 in | Wt 179.0 lb

## 2012-07-15 DIAGNOSIS — R079 Chest pain, unspecified: Secondary | ICD-10-CM

## 2012-07-15 DIAGNOSIS — I10 Essential (primary) hypertension: Secondary | ICD-10-CM

## 2012-07-15 MED ORDER — HYDROCHLOROTHIAZIDE 25 MG PO TABS: 25.0000 mg | ORAL_TABLET | Freq: Every day | ORAL | Status: DC

## 2012-07-15 NOTE — Patient Instructions (Addendum)
Your physician recommends that you schedule a follow-up appointment in:  4-6 weeks. --August 18, 2012 at 3:30  Your physician has recommended you make the following change in your medication:  Start hydrochlorothiazide 25 mg by mouth daily

## 2012-07-15 NOTE — Progress Notes (Signed)
History of Present Illness: 62 yo female with history of IBS, HLD, GERD, seizure disorder here today for cardiac follow up. She was seen as a new patient 03/11/12 for evaluation of chest pain. She told me that she has had substernal burning with exercise with associated SOB for 2 years. This had become more frequent. Echo 03/15/12 with normal LV size and function, mild LVH, mild MR. Treadmill exercise stress test on 04/02/12 with ST depression, chest pain with exercise. Exercise stress myoview 04/08/12 with no evidence of ischemia. ST depression with exercise but no chest pain with exercise. Elevated BP with exercise. It was felt that her chest pain may be related to her HTN. Beta blocker stopped due to bradycardia and Norvasc added.   She is here today for followup. No exertional chest pain or dyspnea. She has occasional burning in her substernal area after meals. BP log from home with SBP 120-150, mostly 130s.    Primary Care Physician: Marga Melnick  Last Lipid Profile:Lipid Panel     Component Value Date/Time   CHOL 217* 02/17/2012 1540   TRIG 136.0 02/17/2012 1540   HDL 47.30 02/17/2012 1540   CHOLHDL 5 02/17/2012 1540   VLDL 27.2 02/17/2012 1540     Past Medical History  Diagnosis Date  . Goiter     stable on [place] as per Dr Altheimer  . IBS (irritable bowel syndrome)   . Allergic rhinitis   . GERD (gastroesophageal reflux disease)   . Hyperlipidemia   . Seizure disorder 41     Dr Ellis Savage, Neurology.One episode in 1977  . Hx of cardiovascular stress test     a. ETT + for ischemia;  b. ETT-MV:  images with no ischemia, EF  77%; +ECG changes c/w ischemia    Past Surgical History  Procedure Date  . Uterine polypectomy 2008     Dr Jennette Kettle  . Colonoscopy 2003 & 2007    Dr Jarold Motto  . Bladder tacking 1974    Current Outpatient Prescriptions  Medication Sig Dispense Refill  . amLODipine (NORVASC) 10 MG tablet Take 1 tablet (10 mg total) by mouth daily.  30 tablet  11  . aspirin EC 81 MG  tablet Take 1 tablet (81 mg total) by mouth daily.  1 tablet  0  . diazepam (VALIUM) 2 MG tablet Take 1 tablet (2 mg total) by mouth every 12 (twelve) hours as needed for anxiety. 1 by mouth every 8-12 hours as needed only  30 tablet  1  . HYDROcodone-acetaminophen (NORCO/VICODIN) 5-325 MG per tablet As needed pt took for surgery      . Multiple Vitamin (MULTIVITAMIN) tablet Take 1 tablet by mouth daily.      . nitroGLYCERIN (NITROSTAT) 0.4 MG SL tablet Place 1 tablet (0.4 mg total) under the tongue every 5 (five) minutes as needed for chest pain.  25 tablet  1    Allergies  Allergen Reactions  . Achromycin (Tetracycline)     History   Social History  . Marital Status: Married    Spouse Name: N/A    Number of Children: 0  . Years of Education: N/A   Occupational History  . Retired, works part-time now for [employer] History Main Topics  . Smoking status: Never Smoker   . Smokeless tobacco: Not on file  . Alcohol Use: 0.5 oz/week    1 drink(s) per week     Comment: Rarely  . Drug Use: No  . Sexually Active:  Not on file   Other Topics Concern  . Not on file   Social History Narrative  . No narrative on file    Family History  Problem Relation Age of Onset  . Prostate cancer Father   . Heart attack Father 36  . Hypertension Mother   . Thalassemia Mother   . Thalassemia Maternal Aunt   . Diverticulitis Brother   . Suicidality Brother   . Stroke Neg Hx   . Diabetes Neg Hx     Review of Systems:  As stated in the HPI and otherwise negative.   BP 132/67  Pulse 53  Ht 5\' 3"  (1.6 m)  Wt 179 lb (81.194 kg)  BMI 31.71 kg/m2  Physical Examination: General: Well developed, well nourished, NAD HEENT: OP clear, mucus membranes moist SKIN: warm, dry. No rashes. Neuro: No focal deficits Musculoskeletal: Muscle strength 5/5 all ext Psychiatric: Mood and affect normal Neck: No JVD, no carotid bruits, no thyromegaly, no lymphadenopathy. Lungs:Clear bilaterally,  no wheezes, rhonci, crackles Cardiovascular: Regular rate and rhythm. No murmurs, gallops or rubs. Abdomen:Soft. Bowel sounds present. Non-tender.  Extremities: No lower extremity edema. Pulses are 2 + in the bilateral DP/PT.  Echo: 03/15/12:  Left ventricle: The cavity size was normal. Wall thickness was increased in a pattern of mild LVH. Systolic function was normal. The estimated ejection fraction was in the range of 55% to 65%. Wall motion was normal; there were no regional wall motion abnormalities. Left ventricular diastolic function parameters were normal. - Mitral valve: Mild regurgitation. - Atrial septum: No defect or patent foramen ovale was identified.  Stress myoview: 04/08/12:  Stress Procedure: The patient exercised on the treadmill utilizing the Bruce protocol for seven minutes. She then stopped due to dyspnea and a hypertension response of 225/85. She denied any chest pain. There were marked ST-T wave changes that quickly resolved and occasional PVC's/PAC's were noted. Technetium 38m Sestamibi was injected at peak exercise and myocardial perfusion imaging was performed after a brief delay.  Stress ECG: No significant change from baseline ECG and The patient had 1-2 mm ST depression in the inferior and lateral leads at peak exercise.  QPS  Raw Data Images: Normal; no motion artifact; normal heart/lung ratio.  Stress Images: Normal homogeneous uptake in all areas of the myocardium.  Rest Images: Normal homogeneous uptake in all areas of the myocardium.  Subtraction (SDS): No evidence of ischemia.  Transient Ischemic Dilatation (Normal <1.22): 0.94  Lung/Heart Ratio (Normal <0.45): 0.27  Quantitative Gated Spect Images  QGS EDV: 78 ml  QGS ESV: 18 ml  Impression  Exercise Capacity: Fair exercise capacity.  BP Response: Hypertensive blood pressure response.  Clinical Symptoms: Mild chest pain/dyspnea.  ECG Impression: There was 1-2 mm ST depression in the inferior and  lateral leads. These changes resolved quickly during the recovery phase.  Comparison with Prior Nuclear Study: No images to compare  Overall Impression: Normal stress nuclear study. Her ECG response was abnormal (similar to her plain GXT last week). There is no evidence of ischemia. She does have a hypertensive response to exercise. The LV function is normal without wall motion abnormalities.  LV Ejection Fraction: 77%. LV Wall Motion: Normal Wall Motion.   Assessment and Plan:   1. Chest pain: She still has some substernal chest pressure at rest. No exertional pain. I have discussed a cardiac cath but she does not wish to pursue at this time. Will see back in 4-6 weeks and arrange cath if she is  still having chest pressure. Stress test with hypertensive response to exercise, no evidence of ischemia. Her myoview images were completely normal. It was felt that her chest pain may be related to her HTN.  2. HTN: Better control. Still with some SBP in the 140s. Will continue Norvasc and add HCTZ 25 mg po Qdaily. Renal artery dopplers ok.

## 2012-07-31 ENCOUNTER — Other Ambulatory Visit: Payer: Self-pay

## 2012-08-18 ENCOUNTER — Ambulatory Visit: Payer: 59 | Admitting: Cardiovascular Disease

## 2012-09-22 ENCOUNTER — Ambulatory Visit (INDEPENDENT_AMBULATORY_CARE_PROVIDER_SITE_OTHER): Payer: 59 | Admitting: Cardiovascular Disease

## 2012-09-22 ENCOUNTER — Encounter: Payer: Self-pay | Admitting: Cardiovascular Disease

## 2012-09-22 VITALS — BP 118/80 | HR 58 | Ht 64.0 in | Wt 177.8 lb

## 2012-09-22 DIAGNOSIS — R079 Chest pain, unspecified: Secondary | ICD-10-CM

## 2012-09-22 DIAGNOSIS — I10 Essential (primary) hypertension: Secondary | ICD-10-CM

## 2012-09-22 NOTE — Progress Notes (Signed)
History of Present Illness: 62 yo female with history of IBS, HLD, GERD, seizure disorder here today for cardiac follow up. She was seen as a new patient 03/11/12 for evaluation of chest pain. She told me that she has had substernal burning with exercise with associated SOB for 2 years. This had become more frequent. Echo 03/15/12 with normal LV size and function, mild LVH, mild MR. Treadmill exercise stress test on 04/02/12 with ST depression, chest pain with exercise. Exercise stress myoview 04/08/12 with no evidence of ischemia. ST depression with exercise but no chest pain with exercise. Elevated BP with exercise. It was felt that her chest pain may be related to her HTN. Beta blocker stopped due to bradycardia and Norvasc added.   She is here today for followup. She is feeling well.  Rare chest pains after meals.  No exertional chest pain or dyspnea. BP log from home with SBP 110-130.    Primary Care Physician: Marga Melnick  Last Lipid Profile:Lipid Panel     Component Value Date/Time   CHOL 217* 02/17/2012 1540   TRIG 136.0 02/17/2012 1540   HDL 47.30 02/17/2012 1540   CHOLHDL 5 02/17/2012 1540   VLDL 27.2 02/17/2012 1540     Past Medical History  Diagnosis Date  . Goiter     stable on [place] as per Dr Altheimer  . IBS (irritable bowel syndrome)   . Allergic rhinitis   . GERD (gastroesophageal reflux disease)   . Hyperlipidemia   . Seizure disorder 6     Dr Ellis Savage, Neurology.One episode in 1977  . Hx of cardiovascular stress test     a. ETT + for ischemia;  b. ETT-MV:  images with no ischemia, EF  77%; +ECG changes c/w ischemia    Past Surgical History  Procedure Laterality Date  . Uterine polypectomy  2008     Dr Jennette Kettle  . Colonoscopy  2003 & 2007    Dr Jarold Motto  . Bladder tacking  1974    Current Outpatient Prescriptions  Medication Sig Dispense Refill  . amLODipine (NORVASC) 10 MG tablet Take 1 tablet (10 mg total) by mouth daily.  30 tablet  11  . aspirin EC 81 MG tablet  Take 1 tablet (81 mg total) by mouth daily.  1 tablet  0  . diazepam (VALIUM) 2 MG tablet Take 1 tablet (2 mg total) by mouth every 12 (twelve) hours as needed for anxiety. 1 by mouth every 8-12 hours as needed only  30 tablet  1  . HYDROcodone-acetaminophen (NORCO/VICODIN) 5-325 MG per tablet As needed pt took for surgery      . Multiple Vitamin (MULTIVITAMIN) tablet Take 1 tablet by mouth daily.      . nitroGLYCERIN (NITROSTAT) 0.4 MG SL tablet Place 1 tablet (0.4 mg total) under the tongue every 5 (five) minutes as needed for chest pain.  25 tablet  1  . hydrochlorothiazide (HYDRODIURIL) 25 MG tablet Take 25 mg by mouth daily. ON HOLD DUE TO LEG CRAMPS       No current facility-administered medications for this visit.    Allergies  Allergen Reactions  . Achromycin (Tetracycline)     History   Social History  . Marital Status: Married    Spouse Name: N/A    Number of Children: 0  . Years of Education: N/A   Occupational History  . Retired, works part-time now for [employer] History Main Topics  . Smoking status: Never Smoker   .  Smokeless tobacco: Not on file  . Alcohol Use: 0.5 oz/week    1 drink(s) per week     Comment: Rarely  . Drug Use: No  . Sexually Active: Not on file   Other Topics Concern  . Not on file   Social History Narrative  . No narrative on file    Family History  Problem Relation Age of Onset  . Prostate cancer Father   . Heart attack Father 42  . Hypertension Mother   . Thalassemia Mother   . Thalassemia Maternal Aunt   . Diverticulitis Brother   . Suicidality Brother   . Stroke Neg Hx   . Diabetes Neg Hx     Review of Systems:  As stated in the HPI and otherwise negative.   BP 118/80  Pulse 58  Ht 5\' 4"  (1.626 m)  Wt 177 lb 12.8 oz (80.65 kg)  BMI 30.5 kg/m2  Physical Examination: General: Well developed, well nourished, NAD HEENT: OP clear, mucus membranes moist SKIN: warm, dry. No rashes. Neuro: No focal  deficits Musculoskeletal: Muscle strength 5/5 all ext Psychiatric: Mood and affect normal Neck: No JVD, no carotid bruits, no thyromegaly, no lymphadenopathy. Lungs:Clear bilaterally, no wheezes, rhonci, crackles Cardiovascular: Regular rate and rhythm. No murmurs, gallops or rubs. Abdomen:Soft. Bowel sounds present. Non-tender.  Extremities: No lower extremity edema. Pulses are 2 + in the bilateral DP/PT.  Assessment and Plan:   1. Chest pain: Only rare chest pains. Stress test with hypertensive response to exercise, no evidence of ischemia. Her myoview images were completely normal. It was felt that her chest pain may be related to her HTN. These are at rest and following meals. No exertional pain. I have discussed a cardiac cath but she does not wish to pursue at this time. I have also discussed a cardiac/coronary CTA but she wishes to hold off on this for now. Will see back in 6 months. She will call if her symptoms changes.   2. HTN: Better controlled. Will continue Norvasc. Will d/c HCTZ with cramping. She stopped on her own.  Renal artery dopplers ok.

## 2012-09-22 NOTE — Patient Instructions (Addendum)
Your physician wants you to follow-up in:  6 months. You will receive a reminder letter in the mail two months in advance. If you don't receive a letter, please call our office to schedule the follow-up appointment.   

## 2012-11-29 ENCOUNTER — Ambulatory Visit (INDEPENDENT_AMBULATORY_CARE_PROVIDER_SITE_OTHER): Payer: 59 | Admitting: Family Medicine

## 2012-11-29 ENCOUNTER — Encounter: Payer: Self-pay | Admitting: Family Medicine

## 2012-11-29 VITALS — BP 130/60 | HR 54 | Temp 98.5°F | Ht 62.08 in | Wt 173.0 lb

## 2012-11-29 DIAGNOSIS — J0100 Acute maxillary sinusitis, unspecified: Secondary | ICD-10-CM

## 2012-11-29 MED ORDER — PROMETHAZINE-DM 6.25-15 MG/5ML PO SYRP: 5.0000 mL | ORAL_SOLUTION | Freq: Four times a day (QID) | ORAL | Status: DC | PRN

## 2012-11-29 MED ORDER — AMOXICILLIN 875 MG PO TABS: 875.0000 mg | ORAL_TABLET | Freq: Two times a day (BID) | ORAL | Status: DC

## 2012-11-29 NOTE — Progress Notes (Signed)
  Subjective:    Patient ID: Renee Lyons, female    DOB: 13-Apr-1951, 62 y.o.   MRN: [id number]  HPI URI- sxs started 6 days ago w/ sore throat, chest congestion, nasal congestion.  Using OTC mucinex and OTC cold meds.  + HA yesterday.  + facial pain, tooth pain.  No documented fevers.  + sick contacts.  Hx of seasonal allergies, not currently on meds.  + nausea, vomited 'white phlegm' Saturday.  Cough- not productive.   Review of Systems For ROS see HPI     Objective:   Physical Exam  Vitals reviewed. Constitutional: She appears well-developed and well-nourished. No distress.  HENT:  Head: Normocephalic and atraumatic.  Right Ear: Tympanic membrane normal.  Left Ear: Tympanic membrane normal.  Nose: Mucosal edema and rhinorrhea present. Right sinus exhibits maxillary sinus tenderness. Right sinus exhibits no frontal sinus tenderness. Left sinus exhibits maxillary sinus tenderness. Left sinus exhibits no frontal sinus tenderness.  Mouth/Throat: Uvula is midline and mucous membranes are normal. Posterior oropharyngeal erythema present. No oropharyngeal exudate.  Eyes: Conjunctivae and EOM are normal. Pupils are equal, round, and reactive to light.  Neck: Normal range of motion. Neck supple.  Cardiovascular: Normal rate, regular rhythm and normal heart sounds.   Pulmonary/Chest: Effort normal and breath sounds normal. No respiratory distress. She has no wheezes.  Lymphadenopathy:    She has no cervical adenopathy.          Assessment & Plan:

## 2012-11-29 NOTE — Assessment & Plan Note (Signed)
New.  Pt's sxs and PE consistent w/ infxn.  Start abx.  Cough meds prn.  Reviewed supportive care and red flags that should prompt return.  Pt expressed understanding and is in agreement w/ plan.  

## 2012-11-29 NOTE — Patient Instructions (Addendum)
Start the Amoxicillin twice daily- take w/ food Drink plenty of fluids REST! Use the cough syrup as needed- will cause drowsiness Mucinex DM for daytime cough Call with any questions or concerns Hang in there!!!

## 2013-02-07 ENCOUNTER — Ambulatory Visit (INDEPENDENT_AMBULATORY_CARE_PROVIDER_SITE_OTHER): Payer: 59 | Admitting: Nurse Practitioner

## 2013-02-07 ENCOUNTER — Encounter: Payer: Self-pay | Admitting: *Deleted

## 2013-02-07 ENCOUNTER — Encounter: Payer: Self-pay | Admitting: Nurse Practitioner

## 2013-02-07 VITALS — BP 136/62 | HR 87 | Temp 98.7°F | Ht 62.08 in | Wt 175.8 lb

## 2013-02-07 DIAGNOSIS — J209 Acute bronchitis, unspecified: Secondary | ICD-10-CM

## 2013-02-07 MED ORDER — PROMETHAZINE-DM 6.25-15 MG/5ML PO SYRP: 5.0000 mL | ORAL_SOLUTION | Freq: Four times a day (QID) | ORAL | Status: DC | PRN

## 2013-02-07 MED ORDER — AMOXICILLIN 875 MG PO TABS: 875.0000 mg | ORAL_TABLET | Freq: Two times a day (BID) | ORAL | Status: DC

## 2013-02-07 NOTE — Progress Notes (Signed)
  Subjective:    Patient ID: Renee Lyons, female    DOB: 07-Oct-1950, 62 y.o.   MRN: [id number]  Sore Throat  This is a new (was treated for sinusitis 2 months ago) problem. The current episode started in the past 7 days (sore throat began5 days ago, cough started 3 days ago, runny nose today). The problem has been gradually worsening (sore throat has improved, cough & runny nose worse). Neither side of throat is experiencing more pain than the other. There has been no fever (thinks had chills yesterday). The pain is mild. Associated symptoms include congestion, coughing and a hoarse voice. Pertinent negatives include no abdominal pain, diarrhea, ear discharge, ear pain, headaches, plugged ear sensation, neck pain, shortness of breath, swollen glands, trouble swallowing or vomiting. Treatments tried: started taking amoxicillin from previous prescription 875 mg. She had stopped medicine 2 days early due to reflux symptoms. The treatment provided no relief.      Review of Systems  Constitutional: Positive for chills and fatigue. Negative for fever, diaphoresis and activity change.  HENT: Positive for congestion, sore throat (pain resolved, feels scratchy now), hoarse voice, rhinorrhea and postnasal drip. Negative for hearing loss, ear pain, trouble swallowing, neck pain, tinnitus and ear discharge.   Eyes: Negative for discharge and redness.  Respiratory: Positive for cough and wheezing (wheezing AT NIGHT, clears with coughing). Negative for chest tightness and shortness of breath.   Cardiovascular: Positive for chest pain (upper sternal chest pain associated with coughing, no chest pain on inspiration). Negative for palpitations.  Gastrointestinal: Negative for nausea, vomiting, abdominal pain and diarrhea.  Musculoskeletal: Negative for back pain.  Skin: Negative for rash.  Neurological: Negative for headaches.  Hematological: Negative for adenopathy.       Objective:   Physical Exam  Vitals  reviewed. Constitutional: She is oriented to person, place, and time. She appears well-developed and well-nourished. No distress.  HENT:  Head: Normocephalic and atraumatic.  Nose: Nose normal.  Mouth/Throat: Oropharynx is clear and moist. No oropharyngeal exudate.  Eyes: Conjunctivae are normal. Right eye exhibits no discharge. Left eye exhibits no discharge.  Neck: Normal range of motion. Neck supple. No tracheal deviation present. Thyromegaly (goiter L neck, followed by Dr. Alwyn Ren) present.  Cardiovascular: Normal rate, regular rhythm and normal heart sounds.   No murmur heard. Pulmonary/Chest: Effort normal and breath sounds normal. No stridor. No respiratory distress. She has no wheezes.  Lymphadenopathy:    She has no cervical adenopathy.  Neurological: She is alert and oriented to person, place, and time.  Skin: Skin is warm and dry.  Psychiatric: She has a normal mood and affect. Her behavior is normal. Thought content normal.          Assessment & Plan:  1. Acute bronchitis  - promethazine-dextromethorphan (PROMETHAZINE-DM) 6.25-15 MG/5ML syrup; Take 5 mLs by mouth 4 (four) times daily as needed.  Dispense: 240 mL; Refill: 0 - amoxicillin (AMOXIL) 875 MG tablet; Take 1 tablet (875 mg total) by mouth 2 (two) times daily.  Dispense: 20 tablet; Refill: 0  See pt instructions.

## 2013-02-07 NOTE — Patient Instructions (Signed)
You will probably feel fatigued for a few more days and may cough for 3 weeks. I don't think you need an antibiotic, but I will prescribe amoxicillin. PLease wat 2-3 days before filling. You may use cough syrup as needed. Also start mucinex & drink plenty of fluids. Get some rest. Return to work in 2-3 days. If you develop fever or chest pain with inspiration, please seek re-evaluation.   Acute Bronchitis You have acute bronchitis. This means you have a chest cold. The airways in your lungs are red and sore (inflamed). Acute means it is sudden onset.  CAUSES Bronchitis is most often caused by the same virus that causes a cold. SYMPTOMS   Body aches.  Chest congestion.  Chills.  Cough.  Fever.  Shortness of breath.  Sore throat. TREATMENT  Acute bronchitis is usually treated with rest, fluids, and medicines for relief of fever or cough. Most symptoms should go away after a few days or a week. Increased fluids may help thin your secretions and will prevent dehydration. Your caregiver may give you an inhaler to improve your symptoms. The inhaler reduces shortness of breath and helps control cough. You can take over-the-counter pain relievers or cough medicine to decrease coughing, pain, or fever. A cool-air vaporizer may help thin bronchial secretions and make it easier to clear your chest. Antibiotics are usually not needed but can be prescribed if you smoke, are seriously ill, have chronic lung problems, are elderly, or you are at higher risk for developing complications.Allergies and asthma can make bronchitis worse. Repeated episodes of bronchitis may cause longstanding lung problems. Avoid smoking and secondhand smoke.Exposure to cigarette smoke or irritating chemicals will make bronchitis worse. If you are a cigarette smoker, consider using nicotine gum or skin patches to help control withdrawal symptoms. Quitting smoking will help your lungs heal faster. Recovery from bronchitis is  often slow, but you should start feeling better after 2 to 3 days. Cough from bronchitis frequently lasts for 3 to 4 weeks. To prevent another bout of acute bronchitis:  Quit smoking.  Wash your hands frequently to get rid of viruses or use a hand sanitizer.  Avoid other people with cold or virus symptoms.  Try not to touch your hands to your mouth, nose, or eyes. SEEK IMMEDIATE MEDICAL CARE IF:  You develop increased fever, chills, or chest pain.  You have severe shortness of breath or bloody sputum.  You develop dehydration, fainting, repeated vomiting, or a severe headache.  You have no improvement after 1 week of treatment or you get worse. MAKE SURE YOU:   Understand these instructions.  Will watch your condition.  Will get help right away if you are not doing well or get worse. Document Released: 07/10/2004 Document Revised: 08/25/2011 Document Reviewed: 09/25/2010 [facility] Patient Information 2014 [place], [place].

## 2013-03-25 ENCOUNTER — Ambulatory Visit (INDEPENDENT_AMBULATORY_CARE_PROVIDER_SITE_OTHER): Payer: 59

## 2013-03-25 DIAGNOSIS — Z23 Encounter for immunization: Secondary | ICD-10-CM

## 2013-04-06 ENCOUNTER — Encounter: Payer: Self-pay | Admitting: Cardiovascular Disease

## 2013-04-06 ENCOUNTER — Ambulatory Visit (INDEPENDENT_AMBULATORY_CARE_PROVIDER_SITE_OTHER): Payer: 59 | Admitting: Cardiovascular Disease

## 2013-04-06 VITALS — BP 142/74 | HR 55 | Ht 63.5 in | Wt 174.8 lb

## 2013-04-06 DIAGNOSIS — I10 Essential (primary) hypertension: Secondary | ICD-10-CM

## 2013-04-06 MED ORDER — AMLODIPINE BESYLATE 10 MG PO TABS: 10.0000 mg | ORAL_TABLET | Freq: Every day | ORAL | Status: DC

## 2013-04-06 NOTE — Patient Instructions (Signed)
Your physician wants you to follow-up in:  12 months.  You will receive a reminder letter in the mail two months in advance. If you don't receive a letter, please call our office to schedule the follow-up appointment.   

## 2013-04-06 NOTE — Progress Notes (Signed)
History of Present Illness: 62 yo female with history of IBS, HLD, GERD, seizure disorder here today for cardiac follow up. She was seen as a new patient 03/11/12 for evaluation of chest pain. She told me that she has had substernal burning with exercise with associated SOB for 2 years. This had become more frequent. Echo 03/15/12 with normal LV size and function, mild LVH, mild MR. Treadmill exercise stress test on 04/02/12 with ST depression, chest pain with exercise. Exercise stress myoview 04/08/12 with no evidence of ischemia. ST depression with exercise but no chest pain with exercise. Elevated BP with exercise. It was felt that her chest pain may be related to her HTN. Beta blocker stopped due to bradycardia and Norvasc added.   She is here today for followup. She is feeling well. No chest pain. She does have some exertional dyspnea. BP well controlled at home.    Primary Care Physician: Marga Melnick  Last Lipid Profile:Lipid Panel     Component Value Date/Time   CHOL 217* 02/17/2012 1540   TRIG 136.0 02/17/2012 1540   HDL 47.30 02/17/2012 1540   CHOLHDL 5 02/17/2012 1540   VLDL 27.2 02/17/2012 1540     Past Medical History  Diagnosis Date  . Goiter     stable on [place] as per Dr Altheimer  . IBS (irritable bowel syndrome)   . Allergic rhinitis   . GERD (gastroesophageal reflux disease)   . Hyperlipidemia   . Seizure disorder 46     Dr Ellis Savage, Neurology.One episode in 1977  . Hx of cardiovascular stress test     a. ETT + for ischemia;  b. ETT-MV:  images with no ischemia, EF  77%; +ECG changes c/w ischemia    Past Surgical History  Procedure Laterality Date  . Uterine polypectomy  2008     Dr Jennette Kettle  . Colonoscopy  2003 & 2007    Dr Jarold Motto  . Bladder tacking  1974    Current Outpatient Prescriptions  Medication Sig Dispense Refill  . amLODipine (NORVASC) 10 MG tablet Take 1 tablet (10 mg total) by mouth daily.  30 tablet  11  . aspirin EC 81 MG tablet Take 1 tablet (81 mg  total) by mouth daily.  1 tablet  0  . diazepam (VALIUM) 2 MG tablet Take 1 tablet (2 mg total) by mouth every 12 (twelve) hours as needed for anxiety. 1 by mouth every 8-12 hours as needed only  30 tablet  1  . hydrochlorothiazide (HYDRODIURIL) 25 MG tablet Take 25 mg by mouth daily. ON HOLD DUE TO LEG CRAMPS      . HYDROcodone-acetaminophen (NORCO/VICODIN) 5-325 MG per tablet As needed pt took for surgery      . Multiple Vitamin (MULTIVITAMIN) tablet Take 1 tablet by mouth daily.      . nitroGLYCERIN (NITROSTAT) 0.4 MG SL tablet Place 1 tablet (0.4 mg total) under the tongue every 5 (five) minutes as needed for chest pain.  25 tablet  1   No current facility-administered medications for this visit.    Allergies  Allergen Reactions  . Achromycin [Tetracycline]     History   Social History  . Marital Status: Married    Spouse Name: N/A    Number of Children: 0  . Years of Education: N/A   Occupational History  . Retired, works part-time now for [employer] History Main Topics  . Smoking status: Never Smoker   . Smokeless tobacco: Not  on file  . Alcohol Use: 0.5 oz/week    1 drink(s) per week     Comment: Rarely  . Drug Use: No  . Sexual Activity: Not on file   Other Topics Concern  . Not on file   Social History Narrative  . No narrative on file    Family History  Problem Relation Age of Onset  . Prostate cancer Father   . Heart attack Father 48  . Hypertension Mother   . Thalassemia Mother   . Thalassemia Maternal Aunt   . Diverticulitis Brother   . Suicidality Brother   . Stroke Neg Hx   . Diabetes Neg Hx     Review of Systems:  As stated in the HPI and otherwise negative.   BP 142/74  Pulse 55  Ht 5' 3.5" (1.613 m)  Wt 174 lb 12.8 oz (79.289 kg)  BMI 30.48 kg/m2  Physical Examination: General: Well developed, well nourished, NAD HEENT: OP clear, mucus membranes moist SKIN: warm, dry. No rashes. Neuro: No focal deficits Musculoskeletal:  Muscle strength 5/5 all ext Psychiatric: Mood and affect normal Neck: No JVD, no carotid bruits, no thyromegaly, no lymphadenopathy. Lungs:Clear bilaterally, no wheezes, rhonci, crackles Cardiovascular: Regular rate and rhythm. No murmurs, gallops or rubs. Abdomen:Soft. Bowel sounds present. Non-tender.  Extremities: No lower extremity edema. Pulses are 2 + in the bilateral DP/PT.  EKG: Sinus brady, rate 55 bpm. Poor R wave progression through the precordial leads.   Assessment and Plan:   1. Chest pain: Resolved. Stress test 2013 with hypertensive response to exercise, no evidence of ischemia. Her myoview images were completely normal. It was felt that her chest pain may be related to her HTN.   2. HTN: Well controlled at home. Will continue Norvasc. HCTZ stopped due to cramping. Renal artery dopplers ok.   One year f/u.

## 2013-09-27 ENCOUNTER — Encounter: Payer: Self-pay | Admitting: Family Medicine

## 2013-09-27 ENCOUNTER — Ambulatory Visit (INDEPENDENT_AMBULATORY_CARE_PROVIDER_SITE_OTHER): Payer: Medicare Other | Admitting: Family Medicine

## 2013-09-27 VITALS — BP 125/75 | HR 60 | Temp 99.1°F | Resp 18 | Ht 62.0 in | Wt 178.0 lb

## 2013-09-27 DIAGNOSIS — J309 Allergic rhinitis, unspecified: Secondary | ICD-10-CM

## 2013-09-27 MED ORDER — FEXOFENADINE HCL 180 MG PO TABS: ORAL_TABLET | ORAL | Status: DC

## 2013-09-27 MED ORDER — FLUTICASONE PROPIONATE 50 MCG/ACT NA SUSP: 2.0000 | Freq: Every day | NASAL | Status: DC

## 2013-09-27 NOTE — Progress Notes (Signed)
OFFICE NOTE  09/27/2013  CC:  Chief Complaint  Patient presents with  . Nasal Congestion  . Cough     HPI: Patient is a 63 y.o. Caucasian female who is here to transfer her care from Dr. Linna Darner, who is retiring.  She wants to discuss recent resp symptoms.  Onset yesterday of scratchy throat, some congestion in back of throat.  No nasal mucous or congestion, no ear sx's. No chest tightness, wheezing, or sob.  No fever. Mild malaise onset last night. Mucinex has been tried.  Taking claritin daily for the last week.   Pertinent PMH:  Past medical, surgical, social hx reviewed.  MEDS: Not taking HCTZ, valium, or vicodin as listed below Outpatient Prescriptions Prior to Visit  Medication Sig Dispense Refill  . amLODipine (NORVASC) 10 MG tablet Take 1 tablet (10 mg total) by mouth daily.  30 tablet  11  . Multiple Vitamin (MULTIVITAMIN) tablet Take 1 tablet by mouth daily.      . nitroGLYCERIN (NITROSTAT) 0.4 MG SL tablet Place 1 tablet (0.4 mg total) under the tongue every 5 (five) minutes as needed for chest pain.  25 tablet  1  . aspirin EC 81 MG tablet Take 1 tablet (81 mg total) by mouth daily.  1 tablet  0  . diazepam (VALIUM) 2 MG tablet Take 1 tablet (2 mg total) by mouth every 12 (twelve) hours as needed for anxiety. 1 by mouth every 8-12 hours as needed only  30 tablet  1  . hydrochlorothiazide (HYDRODIURIL) 25 MG tablet Take 25 mg by mouth daily. ON HOLD DUE TO LEG CRAMPS      . HYDROcodone-acetaminophen (NORCO/VICODIN) 5-325 MG per tablet As needed pt took for surgery       No facility-administered medications prior to visit.    PE: Blood pressure 125/75, pulse 60, temperature 99.1 F (37.3 C), temperature source Temporal, resp. rate 18, height 5\' 2"  (1.575 m), weight 178 lb (80.74 kg), SpO2 98.00%. VS: noted--normal. Gen: alert, NAD, NONTOXIC APPEARING. HEENT: eyes without injection, drainage, or swelling.  Ears: EACs clear, TMs with normal light reflex and landmarks.   Nose: Clear rhinorrhea, with some dried, crusty exudate adherent to mildly injected mucosa.  No purulent d/c.  No paranasal sinus TTP.  No facial swelling.  Throat and mouth without focal lesion.  No pharyngial swelling, erythema, or exudate.   Neck: supple, no LAD.   LUNGS: CTA bilat, nonlabored resps.   CV: RRR, no m/r/g. EXT: no c/c/e SKIN: no rash  LAB: none  IMPRESSION AND PLAN:  Allergic rhinitis vs viral URI, favor allergies. Flonase qd, allegra 180 qd.  Saline nasal spray prn.  D/C claritin. Signs/symptoms to call or return for were reviewed and pt expressed understanding.  An After Visit Summary was printed and given to the patient.  FOLLOW UP: prn

## 2013-09-27 NOTE — Patient Instructions (Signed)
Saline nasal spray (OTC, generic) : 2-3 sprays each nostril several times per day

## 2013-09-27 NOTE — Progress Notes (Signed)
Pre visit review using our clinic review tool, if applicable. No additional management support is needed unless otherwise documented below in the visit note. 

## 2013-10-12 ENCOUNTER — Encounter: Payer: Self-pay | Admitting: Family Medicine

## 2013-10-12 ENCOUNTER — Telehealth: Payer: Self-pay | Admitting: Internal Medicine

## 2013-10-12 ENCOUNTER — Ambulatory Visit (INDEPENDENT_AMBULATORY_CARE_PROVIDER_SITE_OTHER): Payer: Medicare Other | Admitting: Family Medicine

## 2013-10-12 VITALS — BP 141/78 | HR 72 | Temp 98.6°F | Ht 62.0 in | Wt 181.0 lb

## 2013-10-12 DIAGNOSIS — J309 Allergic rhinitis, unspecified: Secondary | ICD-10-CM

## 2013-10-12 DIAGNOSIS — R05 Cough: Secondary | ICD-10-CM | POA: Insufficient documentation

## 2013-10-12 DIAGNOSIS — R059 Cough, unspecified: Secondary | ICD-10-CM

## 2013-10-12 MED ORDER — HYDROCODONE-HOMATROPINE 5-1.5 MG/5ML PO SYRP: ORAL_SOLUTION | ORAL | Status: DC

## 2013-10-12 MED ORDER — MONTELUKAST SODIUM 10 MG PO TABS: ORAL_TABLET | ORAL | Status: DC

## 2013-10-12 NOTE — Progress Notes (Signed)
Pre visit review using our clinic review tool, if applicable. No additional management support is needed unless otherwise documented below in the visit note. 

## 2013-10-12 NOTE — Progress Notes (Signed)
OFFICE NOTE  10/12/2013  CC:  Chief Complaint  Patient presents with  . Cough     HPI: Patient is a 63 y.o. Caucasian female who is here for cough.   I saw her 2 wks ago with allergic rhin vs viral URI sx's.  Recommended flonase and allegra qd +saline nasal spray regularly.  She did these things + mucinex and was improving slowly.  Then the last 2 nights has noted lots of mucous in throat and central chest area and coughing a lot, esp at night.  No fever.  No chest tightness.  Nose sx's have improved. No ST or HA.  Denies SOB/wheezing.  No chest pain.  Pertinent PMH:  Past Medical History  Diagnosis Date  . Goiter     stable on [place] as per Dr Altheimer  . IBS (irritable bowel syndrome)   . Allergic rhinitis   . GERD (gastroesophageal reflux disease)   . Hyperlipidemia   . Seizure disorder 5     Dr Mervyn Skeeters, Neurology.One episode in 1977  . Hx of cardiovascular stress test     a. ETT + for ischemia;  b. ETT-MV:  images with no ischemia, EF  77%; +ECG changes c/w ischemia  . Hypertension    Past Surgical History  Procedure Laterality Date  . Uterine polypectomy  2008     Dr Nori Riis  . Colonoscopy  2003 & 2007    Dr Sharlett Iles  . Bladder tacking  1974    MEDS:  Outpatient Prescriptions Prior to Visit  Medication Sig Dispense Refill  . amLODipine (NORVASC) 10 MG tablet Take 1 tablet (10 mg total) by mouth daily.  30 tablet  11  . fexofenadine (ALLEGRA) 180 MG tablet 1 tab po qAM during allergy season  30 tablet  11  . fluticasone (FLONASE) 50 MCG/ACT nasal spray Place 2 sprays into both nostrils daily.  16 g  11  . Multiple Vitamin (MULTIVITAMIN) tablet Take 1 tablet by mouth daily.      . nitroGLYCERIN (NITROSTAT) 0.4 MG SL tablet Place 1 tablet (0.4 mg total) under the tongue every 5 (five) minutes as needed for chest pain.  25 tablet  1   No facility-administered medications prior to visit.    PE: Blood pressure 141/78, pulse 72, temperature 98.6 F (37 C), temperature  source Oral, height 5\' 2"  (1.575 m), weight 181 lb (82.101 kg), SpO2 98.00%. Gen: Alert, well appearing.  Patient is oriented to person, place, time, and situation. ENT: Ears: EACs clear, normal epithelium.  TMs with good light reflex and landmarks bilaterally.  Eyes: no injection, icteris, swelling, or exudate.  EOMI, PERRLA. Nose: no drainage or turbinate edema/swelling.  No injection or focal lesion.  Mouth: lips without lesion/swelling.  Oral mucosa pink and moist.  Dentition intact and without obvious caries or gingival swelling.  Oropharynx without erythema, exudate, or swelling.  Neck - No masses or thyromegaly or limitation in range of motion CV: RRR, no m/r/g.   LUNGS: CTA bilat, nonlabored resps, good aeration in all lung fields. EXT: no clubbing, cyanosis, or edema.    IMPRESSION AND PLAN:  Allergic rhinitis with pnd, possible mild bronchospasm/bronchitic component but no clear evidence of this from her symptoms and definitely nothing to support this on exam.  Will continue flonase, daily nonsedating antihistamine, and mucinex during daytime. Add singulair 10mg  qd. Add hycodan susp for hs use, 1-2 tsp, #157ml, no RF. Signs/symptoms to call or return for were reviewed and pt expressed understanding.  An  After Visit Summary was printed and given to the patient.  FOLLOW UP: prn

## 2013-10-12 NOTE — Telephone Encounter (Signed)
Patient Information:  Caller Name: Reddin  Phone: [phone]  Patient: Renee Lyons  Gender: Female  DOB: 1950-07-17  Age: 63 Years  PCP: Ricardo Jericho [facility])  Office Follow Up:  Does the office need to follow up with this patient?: No  Instructions For The Office: N/A   Symptoms  Reason For Call & Symptoms: Pt states she was seen in the office 2 weeks ago and diagnosed with bronchitis and advised to take OTC mucinex and cough medication.  Pt states she is still wheezing and congestion.  Reviewed Health History In EMR: Yes  Reviewed Medications In EMR: Yes  Reviewed Allergies In EMR: Yes  Reviewed Surgeries / Procedures: Yes  Date of Onset of Symptoms: 09/28/2013  Guideline(s) Used:  Cough  Disposition Per Guideline:   Go to Office Now  Reason For Disposition Reached:   Wheezing is present  Advice Given:  Call Back If:  You become worse.  Patient Will Follow Care Advice:  YES  Appointment Scheduled:  10/12/2013 15:00:00 Appointment Scheduled Provider:  Ricardo Jericho [facility])

## 2013-11-14 LAB — HM MAMMOGRAPHY

## 2013-12-05 ENCOUNTER — Other Ambulatory Visit: Payer: Self-pay | Admitting: Obstetrics & Gynecology

## 2013-12-29 NOTE — Telephone Encounter (Signed)
Close Encounter 

## 2014-01-31 ENCOUNTER — Other Ambulatory Visit (INDEPENDENT_AMBULATORY_CARE_PROVIDER_SITE_OTHER): Payer: Medicare Other

## 2014-01-31 DIAGNOSIS — Z79899 Other long term (current) drug therapy: Secondary | ICD-10-CM

## 2014-01-31 DIAGNOSIS — E785 Hyperlipidemia, unspecified: Secondary | ICD-10-CM

## 2014-01-31 DIAGNOSIS — Z0000 Encounter for general adult medical examination without abnormal findings: Secondary | ICD-10-CM

## 2014-01-31 LAB — CBC WITH DIFFERENTIAL/PLATELET
BASOS ABS: 0 10*3/uL (ref 0.0–0.1)
BASOS PCT: 0.5 % (ref 0.0–3.0)
Eosinophils Absolute: 0.1 10*3/uL (ref 0.0–0.7)
Eosinophils Relative: 1.7 % (ref 0.0–5.0)
HEMATOCRIT: 37.9 % (ref 36.0–46.0)
HEMOGLOBIN: 12.6 g/dL (ref 12.0–15.0)
LYMPHS ABS: 1.5 10*3/uL (ref 0.7–4.0)
Lymphocytes Relative: 27.5 % (ref 12.0–46.0)
MCHC: 33.3 g/dL (ref 30.0–36.0)
MCV: 85.9 fl (ref 78.0–100.0)
Monocytes Absolute: 0.4 10*3/uL (ref 0.1–1.0)
Monocytes Relative: 7.9 % (ref 3.0–12.0)
Neutro Abs: 3.4 10*3/uL (ref 1.4–7.7)
Neutrophils Relative %: 62.4 % (ref 43.0–77.0)
Platelets: 241 10*3/uL (ref 150.0–400.0)
RBC: 4.41 Mil/uL (ref 3.87–5.11)
RDW: 13.3 % (ref 11.5–15.5)
WBC: 5.4 10*3/uL (ref 4.0–10.5)

## 2014-01-31 LAB — COMPREHENSIVE METABOLIC PANEL
ALT: 16 U/L (ref 0–35)
AST: 24 U/L (ref 0–37)
Albumin: 4.1 g/dL (ref 3.5–5.2)
Alkaline Phosphatase: 75 U/L (ref 39–117)
BILIRUBIN TOTAL: 0.4 mg/dL (ref 0.2–1.2)
BUN: 19 mg/dL (ref 6–23)
CALCIUM: 9.5 mg/dL (ref 8.4–10.5)
CO2: 27 meq/L (ref 19–32)
CREATININE: 0.9 mg/dL (ref 0.4–1.2)
Chloride: 106 mEq/L (ref 96–112)
GFR: 63.46 mL/min (ref >60.00)
Glucose, Bld: 85 mg/dL (ref 70–99)
Potassium: 4.1 mEq/L (ref 3.5–5.1)
Sodium: 141 mEq/L (ref 135–145)
Total Protein: 7.4 g/dL (ref 6.0–8.3)

## 2014-01-31 LAB — LIPID PANEL
CHOLESTEROL: 228 mg/dL — AB (ref 0–200)
HDL: 46.2 mg/dL (ref >39.00)
LDL Cholesterol: 157 mg/dL — ABNORMAL HIGH (ref 0–99)
NONHDL: 181.8
TRIGLYCERIDES: 126 mg/dL (ref 0.0–149.0)
Total CHOL/HDL Ratio: 5
VLDL: 25.2 mg/dL (ref 0.0–40.0)

## 2014-02-01 LAB — TSH: TSH: 3.22 u[IU]/mL (ref 0.35–4.50)

## 2014-02-03 ENCOUNTER — Ambulatory Visit (INDEPENDENT_AMBULATORY_CARE_PROVIDER_SITE_OTHER): Payer: Medicare Other | Admitting: Family Medicine

## 2014-02-03 ENCOUNTER — Encounter: Payer: Self-pay | Admitting: Family Medicine

## 2014-02-03 VITALS — BP 126/81 | HR 50 | Temp 99.0°F | Resp 16 | Ht 62.0 in | Wt 178.0 lb

## 2014-02-03 DIAGNOSIS — Z0000 Encounter for general adult medical examination without abnormal findings: Secondary | ICD-10-CM

## 2014-02-03 DIAGNOSIS — Z23 Encounter for immunization: Secondary | ICD-10-CM

## 2014-02-03 DIAGNOSIS — E785 Hyperlipidemia, unspecified: Secondary | ICD-10-CM

## 2014-02-03 NOTE — Progress Notes (Signed)
Office Note 02/03/2014  CC:  Chief Complaint  Patient presents with  . Annual Exam    fasting   HPI:  Renee Lyons is a 63 y.o. White female who is here for CPE. Reviewed recent fasting labs in detail today with patient.    Feeling well.  Has long hx of stable thyroid goiter; denies any change in size recently and has no trouble swallowing or breathing.   Past Medical History  Diagnosis Date  . Goiter     stable on [place] as per Dr Altheimer  . IBS (irritable bowel syndrome)   . Allergic rhinitis   . GERD (gastroesophageal reflux disease)   . Hyperlipidemia   . Seizure disorder 7     Dr Mervyn Skeeters, Neurology.One episode in 1977  . Hx of cardiovascular stress test 04/09/12    a. ETT + for ischemia;  b. ETT-MV: (2013) images with no ischemia, EF  77%; +ECG changes c/w ischemia  . Hypertension     Past Surgical History  Procedure Laterality Date  . Uterine polypectomy  2008     Dr Nori Riis  . Colonoscopy  2003 & 2007    Dr Sharlett Iles  . Bladder tacking  1974    Family History  Problem Relation Age of Onset  . Prostate cancer Father   . Heart attack Father 84  . Hypertension Mother   . Thalassemia Mother   . Thalassemia Maternal Aunt   . Diverticulitis Brother   . Suicidality Brother   . Stroke Neg Hx   . Diabetes Neg Hx     History   Social History  . Marital Status: Married    Spouse Name: N/A    Number of Children: 0  . Years of Education: N/A   Occupational History  . Retired, works part-time now for [employer]  . Smoking status: Never Smoker   . Smokeless tobacco: Not on file  . Alcohol Use: 0.5 oz/week    1 drink(s) per week     Comment: Rarely  . Drug Use: No  . Sexual Activity: Not on file   Other Topics Concern  . Not on file   Social History Narrative   Married, husband is pt of mine Glenora Morocho).   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     No tobacco.   Alc: 1 drink per week.  No drugs.              Outpatient Prescriptions Prior to Visit  Medication Sig Dispense Refill  . amLODipine (NORVASC) 10 MG tablet Take 1 tablet (10 mg total) by mouth daily.  30 tablet  11  . fexofenadine (ALLEGRA) 180 MG tablet 1 tab po qAM during allergy season  30 tablet  11  . fluticasone (FLONASE) 50 MCG/ACT nasal spray Place 2 sprays into both nostrils daily.  16 g  11  . Multiple Vitamin (MULTIVITAMIN) tablet Take 1 tablet by mouth daily.      . nitroGLYCERIN (NITROSTAT) 0.4 MG SL tablet Place 1 tablet (0.4 mg total) under the tongue every 5 (five) minutes as needed for chest pain.  25 tablet  1  . HYDROcodone-homatropine (HYCODAN) 5-1.5 MG/5ML syrup 1-2 tsp po qhs prn cough  120 mL  0  . montelukast (SINGULAIR) 10 MG tablet 1 tab po qhs for allergies  30 tablet  6   No facility-administered medications prior to visit.    Allergies  Allergen Reactions  . Achromycin [Tetracycline]  ROS Review of Systems  Constitutional: Negative for fever, chills, appetite change and fatigue.  HENT: Negative for congestion, dental problem, ear pain and sore throat.   Eyes: Negative for discharge, redness and visual disturbance.  Respiratory: Negative for cough, chest tightness, shortness of breath and wheezing.   Cardiovascular: Negative for chest pain, palpitations and leg swelling.  Gastrointestinal: Negative for nausea, vomiting, abdominal pain, diarrhea and blood in stool.  Genitourinary: Negative for dysuria, urgency, frequency, hematuria, flank pain and difficulty urinating.  Musculoskeletal: Negative for arthralgias, back pain, joint swelling, myalgias and neck stiffness.  Skin: Negative for pallor and rash.  Neurological: Negative for dizziness, speech difficulty, weakness and headaches.  Hematological: Negative for adenopathy. Does not bruise/bleed easily.  Psychiatric/Behavioral: Negative for confusion and sleep disturbance. The patient is not nervous/anxious.     PE; Blood pressure 126/81,  pulse 50, temperature 99 F (37.2 C), temperature source Temporal, resp. rate 16, height 5\' 2"  (1.575 m), weight 178 lb (80.74 kg), SpO2 100.00%. Gen: Alert, well appearing.  Patient is oriented to person, place, time, and situation. AFFECT: pleasant, lucid thought and speech. ENT: Ears: EACs clear, normal epithelium.  TMs with good light reflex and landmarks bilaterally.  Eyes: no injection, icteris, swelling, or exudate.  EOMI, PERRLA. Nose: no drainage or turbinate edema/swelling.  No injection or focal lesion.  Mouth: lips without lesion/swelling.  Oral mucosa pink and moist.  Dentition intact and without obvious caries or gingival swelling.  Oropharynx without erythema, exudate, or swelling.  Neck: supple/nontender.  Left sided thyromegaly noted, nontender and soft-- a bit bigger than golf ball but smaller than requetball size.  No areas of induration or palpable nodularity or fluctuance.  Carotid pulses 2+ bilaterally, without bruits. CV: RRR, no m/r/g.   LUNGS: CTA bilat, nonlabored resps, good aeration in all lung fields. ABD: soft, NT, ND, BS normal.  No hepatospenomegaly or mass.  No bruits. EXT: no clubbing, cyanosis, or edema.  Musculoskeletal: no joint swelling, erythema, warmth, or tenderness.  ROM of all joints intact. Skin - no sores or suspicious lesions or rashes or color changes   Pertinent labs:  Lab Results  Component Value Date   TSH 3.22 01/31/2014   Lab Results  Component Value Date   WBC 5.4 01/31/2014   HGB 12.6 01/31/2014   HCT 37.9 01/31/2014   MCV 85.9 01/31/2014   PLT 241.0 01/31/2014   Lab Results  Component Value Date   CREATININE 0.9 01/31/2014   BUN 19 01/31/2014   NA 141 01/31/2014   K 4.1 01/31/2014   CL 106 01/31/2014   CO2 27 01/31/2014   Lab Results  Component Value Date   ALT 16 01/31/2014   AST 24 01/31/2014   ALKPHOS 75 01/31/2014   BILITOT 0.4 01/31/2014   Lab Results  Component Value Date   CHOL 228* 01/31/2014   Lab Results  Component Value  Date   HDL 46.20 01/31/2014   Lab Results  Component Value Date   LDLCALC 157* 01/31/2014   Lab Results  Component Value Date   TRIG 126.0 01/31/2014   Lab Results  Component Value Date   CHOLHDL 5 01/31/2014   No results found for this basename: PSA    ASSESSMENT AND PLAN:   Health maintenance examination Reviewed age and gender appropriate health maintenance issues (prudent diet, regular exercise, health risks of tobacco and excessive alcohol, use of seatbelts, fire alarms in home, use of sunscreen).  Also reviewed age and gender appropriate health screening as  well as vaccine recommendations. Prevnar 13 IM today.      HYPERLIPIDEMIA Lipids have not changed much over the last 4-5 yrs: she has not made great efforts at [facility] and is averse to medication trial.  She will attempt TLC over the next 69mo and if not improved chol panel at that time she is willing to reconsider statin trial.  An After Visit Summary was printed and given to the patient.  FOLLOW UP:  Return for 1 yr o/v for CPE, 6 mo lab visit for fasting lipid panel.

## 2014-02-03 NOTE — Assessment & Plan Note (Signed)
Lipids have not changed much over the last 4-5 yrs: she has not made great efforts at [facility] and is averse to medication trial.  She will attempt TLC over the next 59mo and if not improved chol panel at that time she is willing to reconsider statin trial.

## 2014-02-03 NOTE — Assessment & Plan Note (Signed)
Reviewed age and gender appropriate health maintenance issues (prudent diet, regular exercise, health risks of tobacco and excessive alcohol, use of seatbelts, fire alarms in home, use of sunscreen).  Also reviewed age and gender appropriate health screening as well as vaccine recommendations. Prevnar 13 IM today.

## 2014-02-03 NOTE — Progress Notes (Signed)
Pre visit review using our clinic review tool, if applicable. No additional management support is needed unless otherwise documented below in the visit note. 

## 2014-03-20 ENCOUNTER — Other Ambulatory Visit: Payer: Self-pay | Admitting: Obstetrics & Gynecology

## 2014-03-21 LAB — CYTOLOGY - PAP

## 2014-03-22 ENCOUNTER — Ambulatory Visit (INDEPENDENT_AMBULATORY_CARE_PROVIDER_SITE_OTHER): Payer: Medicare Other

## 2014-03-22 ENCOUNTER — Telehealth: Payer: Self-pay | Admitting: Family Medicine

## 2014-03-22 DIAGNOSIS — Z23 Encounter for immunization: Secondary | ICD-10-CM

## 2014-03-22 NOTE — Telephone Encounter (Signed)
Pt came in today for flu shot.  Patient wanted to know if she's due for any other immunizations.  Looks like she's UTD.  Please advise.

## 2014-03-28 DIAGNOSIS — Z85828 Personal history of other malignant neoplasm of skin: Secondary | ICD-10-CM | POA: Insufficient documentation

## 2014-04-07 ENCOUNTER — Other Ambulatory Visit: Payer: Self-pay | Admitting: Cardiovascular Disease

## 2014-05-05 ENCOUNTER — Other Ambulatory Visit: Payer: Self-pay | Admitting: Cardiovascular Disease

## 2014-06-16 DIAGNOSIS — M19041 Primary osteoarthritis, right hand: Secondary | ICD-10-CM

## 2014-06-16 HISTORY — DX: Primary osteoarthritis, right hand: M19.041

## 2014-06-22 ENCOUNTER — Encounter: Payer: Self-pay | Admitting: Cardiovascular Disease

## 2014-06-22 ENCOUNTER — Ambulatory Visit (INDEPENDENT_AMBULATORY_CARE_PROVIDER_SITE_OTHER): Payer: Medicare Other | Admitting: Cardiovascular Disease

## 2014-06-22 VITALS — BP 130/68 | HR 56 | Ht 64.0 in | Wt 182.4 lb

## 2014-06-22 DIAGNOSIS — I10 Essential (primary) hypertension: Secondary | ICD-10-CM | POA: Diagnosis not present

## 2014-06-22 DIAGNOSIS — R079 Chest pain, unspecified: Secondary | ICD-10-CM | POA: Diagnosis not present

## 2014-06-22 DIAGNOSIS — I340 Nonrheumatic mitral (valve) insufficiency: Secondary | ICD-10-CM

## 2014-06-22 MED ORDER — AMLODIPINE BESYLATE 10 MG PO TABS: 10.0000 mg | ORAL_TABLET | Freq: Every day | ORAL | Status: DC

## 2014-06-22 NOTE — Progress Notes (Signed)
History of Present Illness: 64 yo female with history of IBS, HLD, GERD, seizure disorder here today for cardiac follow up. She was seen as a new patient 03/11/12 for evaluation of chest pain. She told me that she has had substernal burning with exercise with associated SOB for 2 years. This had become more frequent. Echo 03/15/12 with normal LV size and function, mild LVH, mild MR. Treadmill exercise stress test on 04/02/12 with ST depression, chest pain with exercise. Exercise stress myoview 04/08/12 with no evidence of ischemia. ST depression with exercise but no chest pain with exercise. Elevated BP with exercise. It was felt that her chest pain may be related to her HTN. Beta blocker stopped due to bradycardia and Norvasc added.   She is here today for followup. She is feeling well. No chest pain or SOB. BP well controlled at home.    Primary Care Physician: Unice Cobble  Last Lipid Profile:Lipid Panel     Component Value Date/Time   CHOL 228* 01/31/2014 0821   TRIG 126.0 01/31/2014 0821   HDL 46.20 01/31/2014 0821   CHOLHDL 5 01/31/2014 0821   VLDL 25.2 01/31/2014 0821   LDLCALC 157* 01/31/2014 0821     Past Medical History  Diagnosis Date  . Goiter     stable on [place] as per Dr Altheimer  . IBS (irritable bowel syndrome)   . Allergic rhinitis   . GERD (gastroesophageal reflux disease)   . Hyperlipidemia   . Seizure disorder 51     Dr Mervyn Skeeters, Neurology.One episode in 1977  . Hx of cardiovascular stress test 04/09/12    a. ETT + for ischemia;  b. ETT-MV: (2013) images with no ischemia, EF  77%; +ECG changes c/w ischemia  . Hypertension     Past Surgical History  Procedure Laterality Date  . Uterine polypectomy  2008     Dr Nori Riis  . Colonoscopy  2003 & 2007    Dr Sharlett Iles  . Bladder tacking  1974    Current Outpatient Prescriptions  Medication Sig Dispense Refill  . ALPRAZolam (XANAX) 0.5 MG tablet Take 0.5 mg by mouth daily as needed for anxiety. 0.5 mg by mouth  for anxiety  5  . amLODipine (NORVASC) 10 MG tablet TAKE 1 TABLET BY MOUTH EVERY DAY 30 tablet 1  . fexofenadine (ALLEGRA) 180 MG tablet 1 tab po qAM during allergy season 30 tablet 11  . fluticasone (FLONASE) 50 MCG/ACT nasal spray Place 2 sprays into both nostrils daily. 16 g 11  . Multiple Vitamin (MULTIVITAMIN) tablet Take 1 tablet by mouth daily.    . nitroGLYCERIN (NITROSTAT) 0.4 MG SL tablet Place 1 tablet (0.4 mg total) under the tongue every 5 (five) minutes as needed for chest pain. 25 tablet 1  . nystatin-triamcinolone ointment (MYCOLOG) Apply 1 application topically. Apply ointment to skin for rash as needed  5   No current facility-administered medications for this visit.    Allergies  Allergen Reactions  . Achromycin [Tetracycline] Hives    History   Social History  . Marital Status: Married    Spouse Name: N/A    Number of Children: 0  . Years of Education: N/A   Occupational History  . Retired, works part-time now for [employer]  . Smoking status: Never Smoker   . Smokeless tobacco: Not on file  . Alcohol Use: 0.5 oz/week    1 drink(s) per week     Comment: Rarely  .  Drug Use: No  . Sexual Activity: Not on file   Other Topics Concern  . Not on file   Social History Narrative   Married, husband is pt of mine Quintella Mura).   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     No tobacco.   Alc: 1 drink per week.  No drugs.             Family History  Problem Relation Age of Onset  . Prostate cancer Father   . Heart attack Father 46  . Hypertension Mother   . Thalassemia Mother   . Thalassemia Maternal Aunt   . Diverticulitis Brother   . Suicidality Brother   . Stroke Neg Hx   . Diabetes Neg Hx     Review of Systems:  As stated in the HPI and otherwise negative.   BP 130/68 mmHg  Pulse 56  Ht 5\' 4"  (1.626 m)  Wt 182 lb 6.4 oz (82.736 kg)  BMI 31.29 kg/m2  SpO2 99%  Physical Examination: General: Well developed,  well nourished, NAD HEENT: OP clear, mucus membranes moist SKIN: warm, dry. No rashes. Neuro: No focal deficits Musculoskeletal: Muscle strength 5/5 all ext Psychiatric: Mood and affect normal Neck: No JVD, no carotid bruits, no thyromegaly, no lymphadenopathy. Lungs:Clear bilaterally, no wheezes, rhonci, crackles Cardiovascular: Regular rate and rhythm. No murmurs, gallops or rubs. Abdomen:Soft. Bowel sounds present. Non-tender.  Extremities: No lower extremity edema. Pulses are 2 + in the bilateral DP/PT.  EKG: Sinus brady, rate 56 bpm. Poor R wave progression through the precordial leads.   Assessment and Plan:   1. Chest pain: No recurrence. Stress test 2013 with hypertensive response to exercise, no evidence of ischemia. Her myoview images were completely normal. It was felt that her chest pain may be related to her HTN.   2. HTN: Well controlled. Will continue Norvasc. Renal artery dopplers ok.  3. Mitral regurgitation: Mild by echo 2013. Will repeat in 2017 when I see her back.

## 2014-06-22 NOTE — Patient Instructions (Signed)
Your physician wants you to follow-up in:  12 months.  You will receive a reminder letter in the mail two months in advance. If you don't receive a letter, please call our office to schedule the follow-up appointment.   

## 2014-09-07 DIAGNOSIS — L814 Other melanin hyperpigmentation: Secondary | ICD-10-CM | POA: Diagnosis not present

## 2014-09-07 DIAGNOSIS — Z85828 Personal history of other malignant neoplasm of skin: Secondary | ICD-10-CM | POA: Diagnosis not present

## 2014-09-07 DIAGNOSIS — L821 Other seborrheic keratosis: Secondary | ICD-10-CM | POA: Diagnosis not present

## 2014-09-07 DIAGNOSIS — D239 Other benign neoplasm of skin, unspecified: Secondary | ICD-10-CM | POA: Diagnosis not present

## 2014-09-07 DIAGNOSIS — L820 Inflamed seborrheic keratosis: Secondary | ICD-10-CM | POA: Diagnosis not present

## 2014-10-03 ENCOUNTER — Telehealth: Payer: Self-pay | Admitting: Family Medicine

## 2014-10-03 DIAGNOSIS — M25541 Pain in joints of right hand: Secondary | ICD-10-CM

## 2014-10-03 DIAGNOSIS — M25542 Pain in joints of left hand: Principal | ICD-10-CM

## 2014-10-03 NOTE — Telephone Encounter (Signed)
Patient is requesting referral to rheumatology referral for the arthritis in her hands.

## 2014-10-03 NOTE — Telephone Encounter (Signed)
Please advise 

## 2014-10-05 NOTE — Telephone Encounter (Signed)
Referral ordered per pt request. 

## 2014-12-08 ENCOUNTER — Other Ambulatory Visit: Payer: Self-pay | Admitting: *Deleted

## 2014-12-08 MED ORDER — AMLODIPINE BESYLATE 10 MG PO TABS: 10.0000 mg | ORAL_TABLET | Freq: Every day | ORAL | Status: DC

## 2014-12-13 DIAGNOSIS — M19042 Primary osteoarthritis, left hand: Secondary | ICD-10-CM | POA: Diagnosis not present

## 2014-12-13 DIAGNOSIS — M19041 Primary osteoarthritis, right hand: Secondary | ICD-10-CM | POA: Diagnosis not present

## 2014-12-13 DIAGNOSIS — M79643 Pain in unspecified hand: Secondary | ICD-10-CM | POA: Diagnosis not present

## 2015-01-02 ENCOUNTER — Encounter: Payer: Self-pay | Admitting: Family Medicine

## 2015-01-11 ENCOUNTER — Encounter: Payer: Self-pay | Admitting: *Deleted

## 2015-01-15 ENCOUNTER — Telehealth: Payer: Self-pay

## 2015-01-15 DIAGNOSIS — R9439 Abnormal result of other cardiovascular function study: Secondary | ICD-10-CM

## 2015-01-15 DIAGNOSIS — R079 Chest pain, unspecified: Secondary | ICD-10-CM

## 2015-01-15 MED ORDER — NITROGLYCERIN 0.4 MG SL SUBL: 0.4000 mg | SUBLINGUAL_TABLET | SUBLINGUAL | Status: DC | PRN

## 2015-01-15 NOTE — Telephone Encounter (Signed)
Patient called in over the weekend, Nitrostat has expired. When refilling, the system asked me to provide a new diagnoses, so I felt like you need to address.   Please take a look at this.  (MRN: [id number])

## 2015-01-15 NOTE — Telephone Encounter (Signed)
Refill for NTG sent in

## 2015-01-15 NOTE — Telephone Encounter (Signed)
Thanks. cdm 

## 2015-01-22 DIAGNOSIS — L821 Other seborrheic keratosis: Secondary | ICD-10-CM | POA: Diagnosis not present

## 2015-01-22 DIAGNOSIS — Z85828 Personal history of other malignant neoplasm of skin: Secondary | ICD-10-CM | POA: Diagnosis not present

## 2015-01-22 DIAGNOSIS — L820 Inflamed seborrheic keratosis: Secondary | ICD-10-CM | POA: Diagnosis not present

## 2015-03-01 ENCOUNTER — Other Ambulatory Visit: Payer: Self-pay | Admitting: Family Medicine

## 2015-03-01 NOTE — Telephone Encounter (Signed)
Pt advised and voiced understanding.   

## 2015-03-01 NOTE — Telephone Encounter (Signed)
Left message on work vm for pt to call back, pt is over due for ov, needs to schedule for more refills.

## 2015-03-01 NOTE — Telephone Encounter (Addendum)
RF request for fluticasone LOV: 02/03/14 Next ov: None Last written: 09/27/13 16g w/ 11RF

## 2015-03-28 ENCOUNTER — Other Ambulatory Visit: Payer: Self-pay | Admitting: Family Medicine

## 2015-03-29 ENCOUNTER — Other Ambulatory Visit: Payer: Self-pay | Admitting: *Deleted

## 2015-03-29 NOTE — Telephone Encounter (Signed)
RF request for fluticasone LOV: 02/03/14 Next ov: None Last written: 03/01/15 16g w/ 0RF

## 2015-06-12 ENCOUNTER — Other Ambulatory Visit: Payer: Self-pay | Admitting: Cardiovascular Disease

## 2015-06-25 ENCOUNTER — Ambulatory Visit: Payer: Medicare Other | Admitting: Cardiovascular Disease

## 2015-06-27 ENCOUNTER — Ambulatory Visit (INDEPENDENT_AMBULATORY_CARE_PROVIDER_SITE_OTHER): Payer: Medicare Other | Admitting: Cardiovascular Disease

## 2015-06-27 ENCOUNTER — Encounter: Payer: Self-pay | Admitting: *Deleted

## 2015-06-27 ENCOUNTER — Encounter: Payer: Self-pay | Admitting: Cardiovascular Disease

## 2015-06-27 VITALS — BP 120/78 | HR 60 | Ht 68.0 in | Wt 181.8 lb

## 2015-06-27 DIAGNOSIS — I340 Nonrheumatic mitral (valve) insufficiency: Secondary | ICD-10-CM | POA: Diagnosis not present

## 2015-06-27 DIAGNOSIS — I200 Unstable angina: Secondary | ICD-10-CM | POA: Diagnosis not present

## 2015-06-27 DIAGNOSIS — I10 Essential (primary) hypertension: Secondary | ICD-10-CM | POA: Diagnosis not present

## 2015-06-27 LAB — CBC WITH DIFFERENTIAL/PLATELET
Basophils Absolute: 0 10*3/uL (ref 0.0–0.1)
Basophils Relative: 0 % (ref 0–1)
EOS ABS: 0.1 10*3/uL (ref 0.0–0.7)
EOS PCT: 2 % (ref 0–5)
HEMATOCRIT: 35.5 % — AB (ref 36.0–46.0)
Hemoglobin: 11.9 g/dL — ABNORMAL LOW (ref 12.0–15.0)
LYMPHS PCT: 42 % (ref 12–46)
Lymphs Abs: 2.9 10*3/uL (ref 0.7–4.0)
MCH: 28.1 pg (ref 26.0–34.0)
MCHC: 33.5 g/dL (ref 30.0–36.0)
MCV: 83.9 fL (ref 78.0–100.0)
MONO ABS: 0.6 10*3/uL (ref 0.1–1.0)
MPV: 10.2 fL (ref 8.6–12.4)
Monocytes Relative: 9 % (ref 3–12)
Neutro Abs: 3.3 10*3/uL (ref 1.7–7.7)
Neutrophils Relative %: 47 % (ref 43–77)
Platelets: 277 10*3/uL (ref 150–400)
RBC: 4.23 MIL/uL (ref 3.87–5.11)
RDW: 13.3 % (ref 11.5–15.5)
WBC: 7 10*3/uL (ref 4.0–10.5)

## 2015-06-27 LAB — BASIC METABOLIC PANEL
BUN: 23 mg/dL (ref 7–25)
CO2: 25 mmol/L (ref 20–31)
CREATININE: 0.9 mg/dL (ref 0.50–0.99)
Calcium: 9.7 mg/dL (ref 8.6–10.4)
Chloride: 103 mmol/L (ref 98–110)
GLUCOSE: 88 mg/dL (ref 65–99)
POTASSIUM: 4 mmol/L (ref 3.5–5.3)
Sodium: 137 mmol/L (ref 135–146)

## 2015-06-27 NOTE — Progress Notes (Signed)
Chief Complaint  Patient presents with  . Follow-up    pt c/o sob with activity  . Hypertension      History of Present Illness: 65 yo female with history of IBS, HLD, GERD, seizure disorder here today for cardiac follow up. She was seen as a new patient 03/11/12 for evaluation of chest pain. She told me that she has had substernal burning with exercise with associated SOB for 2 years. This had become more frequent. Echo 03/15/12 with normal LV size and function, mild LVH, mild MR. Treadmill exercise stress test on 04/02/12 with ST depression, chest pain with exercise. Exercise stress myoview 04/08/12 with no evidence of ischemia. ST depression with exercise but no chest pain with exercise. Elevated BP with exercise. It was felt that her chest pain may be related to her HTN. Beta blocker stopped due to bradycardia and Norvasc added.   She is here today for followup. She describes profound dyspnea with minimal exertion. She also endorses substernal chest pain. This has worsened over the last few months. No near syncope, syncope or LE edema.     Primary Care Physician: [name], MD    Past Medical History  Diagnosis Date  . Goiter     stable on [place] as per Dr Altheimer  . IBS (irritable bowel syndrome)   . Allergic rhinitis   . GERD (gastroesophageal reflux disease)   . Hyperlipidemia   . Seizure disorder [facility]) 1977     Dr Mervyn Skeeters, Neurology.One episode in 1977  . Hx of cardiovascular stress test 04/09/12    a. ETT + for ischemia;  b. ETT-MV: (2013) images with no ischemia, EF  77%; +ECG changes c/w ischemia  . Hypertension   . Osteoarthritis of both hands 2016    Both hands, wrists, R knee.  Rheum eval 11/2014 [facility] (Dr. Trudie Reed)    Past Surgical History  Procedure Laterality Date  . Uterine polypectomy  2008     Dr Nori Riis  . Colonoscopy  2003 & 2007    Dr Sharlett Iles  . Bladder tacking  1974    Current Outpatient Prescriptions  Medication Sig Dispense Refill  . ALPRAZolam  (XANAX) 0.5 MG tablet Take 0.5 mg by mouth daily as needed for anxiety. 0.5 mg by mouth for anxiety  5  . amLODipine (NORVASC) 10 MG tablet Take 1 tablet by mouth  daily 90 tablet 0  . fexofenadine (ALLEGRA) 180 MG tablet 1 tab po qAM during allergy season 30 tablet 11  . fluticasone (FLONASE) 50 MCG/ACT nasal spray USE 2 SPRAYS IN BOTH NOSTRILS EVERY DAY 16 g 1  . Multiple Vitamin (MULTIVITAMIN) tablet Take 1 tablet by mouth daily.    . nitroGLYCERIN (NITROSTAT) 0.4 MG SL tablet Place 1 tablet (0.4 mg total) under the tongue every 5 (five) minutes as needed for chest pain. 25 tablet 6  . nystatin-triamcinolone ointment (MYCOLOG) Apply 1 application topically. Apply ointment to skin for rash as needed  5   No current facility-administered medications for this visit.    Allergies  Allergen Reactions  . Achromycin [Tetracycline] Hives  . Other Rash    "Mycins" - hives    Social History   Social History  . Marital Status: Married    Spouse Name: N/A  . Number of Children: 0  . Years of Education: N/A   Occupational History  . Retired, works part-time now for [employer]  . Smoking status: Never Smoker   .  Smokeless tobacco: Not on file  . Alcohol Use: 0.5 oz/week    1 drink(s) per week     Comment: Rarely  . Drug Use: No  . Sexual Activity: Not on file   Other Topics Concern  . Not on file   Social History Narrative   Married, husband is pt of mine Vann Heyde).   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     No tobacco.   Alc: 1 drink per week.  No drugs.             Family History  Problem Relation Age of Onset  . Prostate cancer Father   . Heart attack Father 30  . Hypertension Mother   . Thalassemia Mother   . Thalassemia Maternal Aunt   . Diverticulitis Brother   . Suicidality Brother   . Stroke Neg Hx   . Diabetes Neg Hx     Review of Systems:  As stated in the HPI and otherwise negative.   BP 120/78 mmHg  Pulse 60  Ht 5'  8" (1.727 m)  Wt 181 lb 12.8 oz (82.464 kg)  BMI 27.65 kg/m2  SpO2 98%  Physical Examination: General: Well developed, well nourished, NAD HEENT: OP clear, mucus membranes moist SKIN: warm, dry. No rashes. Neuro: No focal deficits Musculoskeletal: Muscle strength 5/5 all ext Psychiatric: Mood and affect normal Neck: No JVD, no carotid bruits, no thyromegaly, no lymphadenopathy. Lungs:Clear bilaterally, no wheezes, rhonci, crackles Cardiovascular: Regular rate and rhythm. No murmurs, gallops or rubs. Abdomen:Soft. Bowel sounds present. Non-tender.  Extremities: No lower extremity edema. Pulses are 2 + in the bilateral DP/PT.  EKG:  EKG is ordered today. The ekg ordered today demonstrates sinus brady, rate 59 bpm. Poor R wave progression precordial leads  Recent Labs: 06/27/2015: BUN 23; Creat 0.90; Hemoglobin 11.9*; Platelets 277; Potassium 4.0; Sodium 137   Lipid Panel    Component Value Date/Time   CHOL 228* 01/31/2014 0821   TRIG 126.0 01/31/2014 0821   HDL 46.20 01/31/2014 0821   CHOLHDL 5 01/31/2014 0821   VLDL 25.2 01/31/2014 0821   LDLCALC 157* 01/31/2014 0821   LDLDIRECT 144.3 02/17/2012 1540     Wt Readings from Last 3 Encounters:  06/27/15 181 lb 12.8 oz (82.464 kg)  06/22/14 182 lb 6.4 oz (82.736 kg)  02/03/14 178 lb (80.74 kg)     Other studies Reviewed: Additional studies/ records that were reviewed today include: . Review of the above records demonstrates:    Assessment and Plan:   1. Unstable angina: She has chest pain and dyspnea worrisome for unstable angina. ST changes with stress test several years ago but perfusion images normal. Will plan right and left heart cath to exclude CAD, Pulm HTN. Risks and benefits reviewed with pt. Pre-cath labs today. She agrees to proceed.   2. HTN: Well controlled. Will continue Norvasc. Renal artery dopplers ok.  3. Mitral regurgitation: Mild by echo 2013. Will repeat echo next visit.   Current medicines are  reviewed at length with the patient today.  The patient does not have concerns regarding medicines.  The following changes have been made:  no change  Labs/ tests ordered today include: right and left heart cath  Orders Placed This Encounter  Procedures  . Basic Metabolic Panel (BMET)  . CBC w/Diff  . INR/PT  . EKG 12-Lead     Disposition:   FU with me in 6 weeks   Signed, Lauree Chandler, MD 06/28/2015 7:53 AM  [facility] [place], [place], [place]  [address] Phone: [phone]; Fax: [phone]

## 2015-06-27 NOTE — Patient Instructions (Signed)
Medication Instructions:  Your physician recommends that you continue on your current medications as directed. Please refer to the Current Medication list given to you today.   Labwork: Lab work to be done today-BMP, CBC, PT  Testing/Procedures: Your physician has requested that you have a cardiac catheterization. Cardiac catheterization is used to diagnose and/or treat various heart conditions. Doctors may recommend this procedure for a number of different reasons. The most common reason is to evaluate chest pain. Chest pain can be a symptom of coronary artery disease (CAD), and cardiac catheterization can show whether plaque is narrowing or blocking your heart's arteries. This procedure is also used to evaluate the valves, as well as measure the blood flow and oxygen levels in different parts of your heart. For further information please visit [email or web address]. Please follow instruction sheet, as given. Scheduled for January 17,2017    Follow-Up: Your physician recommends that you schedule a follow-up appointment in: about 5 weeks.    Any Other Special Instructions Will Be Listed Below (If Applicable).     If you need a refill on your cardiac medications before your next appointment, please call your pharmacy.

## 2015-06-28 LAB — PROTIME-INR
INR: 0.95 (ref <1.50)
Prothrombin Time: 12.8 seconds (ref 11.6–15.2)

## 2015-07-03 ENCOUNTER — Encounter (HOSPITAL_COMMUNITY)
Admission: RE | Disposition: A | Discharge status: Home / Self Care | Payer: Self-pay | Source: Ambulatory Visit | Attending: Cardiovascular Disease

## 2015-07-03 ENCOUNTER — Ambulatory Visit (HOSPITAL_COMMUNITY)
Admission: RE | Admit: 2015-07-03 | Discharge: 2015-07-03 | Disposition: A | Discharge status: Home / Self Care | Payer: Medicare Other | Source: Ambulatory Visit | Attending: Cardiovascular Disease | Admitting: Cardiovascular Disease

## 2015-07-03 DIAGNOSIS — Z7951 Long term (current) use of inhaled steroids: Secondary | ICD-10-CM | POA: Insufficient documentation

## 2015-07-03 DIAGNOSIS — J309 Allergic rhinitis, unspecified: Secondary | ICD-10-CM | POA: Insufficient documentation

## 2015-07-03 DIAGNOSIS — Z8249 Family history of ischemic heart disease and other diseases of the circulatory system: Secondary | ICD-10-CM | POA: Diagnosis not present

## 2015-07-03 DIAGNOSIS — E049 Nontoxic goiter, unspecified: Secondary | ICD-10-CM | POA: Diagnosis not present

## 2015-07-03 DIAGNOSIS — I10 Essential (primary) hypertension: Secondary | ICD-10-CM | POA: Insufficient documentation

## 2015-07-03 DIAGNOSIS — M1990 Unspecified osteoarthritis, unspecified site: Secondary | ICD-10-CM | POA: Insufficient documentation

## 2015-07-03 DIAGNOSIS — K589 Irritable bowel syndrome without diarrhea: Secondary | ICD-10-CM | POA: Diagnosis not present

## 2015-07-03 DIAGNOSIS — K219 Gastro-esophageal reflux disease without esophagitis: Secondary | ICD-10-CM | POA: Diagnosis not present

## 2015-07-03 DIAGNOSIS — G40909 Epilepsy, unspecified, not intractable, without status epilepticus: Secondary | ICD-10-CM | POA: Diagnosis not present

## 2015-07-03 DIAGNOSIS — R0600 Dyspnea, unspecified: Secondary | ICD-10-CM | POA: Insufficient documentation

## 2015-07-03 DIAGNOSIS — R0609 Other forms of dyspnea: Secondary | ICD-10-CM | POA: Insufficient documentation

## 2015-07-03 DIAGNOSIS — I340 Nonrheumatic mitral (valve) insufficiency: Secondary | ICD-10-CM | POA: Diagnosis not present

## 2015-07-03 DIAGNOSIS — E785 Hyperlipidemia, unspecified: Secondary | ICD-10-CM | POA: Insufficient documentation

## 2015-07-03 DIAGNOSIS — I200 Unstable angina: Secondary | ICD-10-CM | POA: Insufficient documentation

## 2015-07-03 HISTORY — PX: CARDIAC CATHETERIZATION: SHX172

## 2015-07-03 LAB — POCT I-STAT 3, VENOUS BLOOD GAS (G3P V)
Acid-base deficit: 2 mmol/L (ref 0.0–2.0)
BICARBONATE: 23.5 meq/L (ref 20.0–24.0)
O2 Saturation: 72 %
PH VEN: 7.38 — AB (ref 7.250–7.300)
PO2 VEN: 39 mmHg (ref 30.0–45.0)
TCO2: 25 mmol/L (ref 0–100)
pCO2, Ven: 39.6 mmHg — ABNORMAL LOW (ref 45.0–50.0)

## 2015-07-03 LAB — POCT I-STAT 3, ART BLOOD GAS (G3+)
Acid-base deficit: 2 mmol/L (ref 0.0–2.0)
Bicarbonate: 22.8 mEq/L (ref 20.0–24.0)
O2 Saturation: 95 %
PCO2 ART: 37.6 mmHg (ref 35.0–45.0)
PO2 ART: 76 mmHg — AB (ref 80.0–100.0)
TCO2: 24 mmol/L (ref 0–100)
pH, Arterial: 7.391 (ref 7.350–7.450)

## 2015-07-03 LAB — POCT ACTIVATED CLOTTING TIME
ACTIVATED CLOTTING TIME: 203 s
Activated Clotting Time: 183 seconds

## 2015-07-03 SURGERY — RIGHT/LEFT HEART CATH AND CORONARY ANGIOGRAPHY
Anesthesia: LOCAL

## 2015-07-03 MED ORDER — HEPARIN SODIUM (PORCINE) 1000 UNIT/ML IJ SOLN
INTRAMUSCULAR | Status: DC | PRN
  Administered 2015-07-03: 4000 [IU] via INTRAVENOUS

## 2015-07-03 MED ORDER — MIDAZOLAM HCL 2 MG/2ML IJ SOLN
INTRAMUSCULAR | Status: DC | PRN
  Administered 2015-07-03: 2 mg via INTRAVENOUS

## 2015-07-03 MED ORDER — VERAPAMIL HCL 2.5 MG/ML IV SOLN
INTRAVENOUS | Status: AC
  Filled 2015-07-03: qty 2

## 2015-07-03 MED ORDER — FENTANYL CITRATE (PF) 100 MCG/2ML IJ SOLN
INTRAMUSCULAR | Status: DC | PRN
  Administered 2015-07-03: 25 ug via INTRAVENOUS

## 2015-07-03 MED ORDER — SODIUM CHLORIDE 0.9 % IJ SOLN: 3.0000 mL | INTRAMUSCULAR | Status: DC | PRN

## 2015-07-03 MED ORDER — SODIUM CHLORIDE 0.9 % IJ SOLN: 3.0000 mL | Freq: Two times a day (BID) | INTRAMUSCULAR | Status: DC

## 2015-07-03 MED ORDER — HEPARIN (PORCINE) IN NACL 2-0.9 UNIT/ML-% IJ SOLN
INTRAMUSCULAR | Status: AC
  Filled 2015-07-03: qty 1000

## 2015-07-03 MED ORDER — SODIUM CHLORIDE 0.9 % IV SOLN
INTRAVENOUS | Status: AC
  Administered 2015-07-03: 1000 mL via INTRAVENOUS

## 2015-07-03 MED ORDER — FENTANYL CITRATE (PF) 100 MCG/2ML IJ SOLN
INTRAMUSCULAR | Status: AC
  Filled 2015-07-03: qty 2

## 2015-07-03 MED ORDER — VERAPAMIL HCL 2.5 MG/ML IV SOLN
INTRAVENOUS | Status: DC | PRN
  Administered 2015-07-03: 10:00:00 via INTRA_ARTERIAL

## 2015-07-03 MED ORDER — HEPARIN (PORCINE) IN NACL 2-0.9 UNIT/ML-% IJ SOLN
INTRAMUSCULAR | Status: AC
  Filled 2015-07-03: qty 500

## 2015-07-03 MED ORDER — HEPARIN SODIUM (PORCINE) 1000 UNIT/ML IJ SOLN
INTRAMUSCULAR | Status: AC
  Filled 2015-07-03: qty 1

## 2015-07-03 MED ORDER — MIDAZOLAM HCL 2 MG/2ML IJ SOLN
INTRAMUSCULAR | Status: AC
  Filled 2015-07-03: qty 2

## 2015-07-03 MED ORDER — LIDOCAINE HCL (PF) 1 % IJ SOLN
INTRAMUSCULAR | Status: DC | PRN
  Administered 2015-07-03: 10:00:00

## 2015-07-03 MED ORDER — SODIUM CHLORIDE 0.9 % IV SOLN: INTRAVENOUS | Status: AC

## 2015-07-03 MED ORDER — ASPIRIN 81 MG PO CHEW
81.0000 mg | CHEWABLE_TABLET | ORAL | Status: AC
  Administered 2015-07-03: 81 mg via ORAL

## 2015-07-03 MED ORDER — ASPIRIN 81 MG PO CHEW
CHEWABLE_TABLET | ORAL | Status: AC
  Filled 2015-07-03: qty 1

## 2015-07-03 MED ORDER — IOHEXOL 350 MG/ML SOLN
INTRAVENOUS | Status: DC | PRN
  Administered 2015-07-03: 70 mL via INTRA_ARTERIAL

## 2015-07-03 MED ORDER — SODIUM CHLORIDE 0.9 % IV SOLN: 250.0000 mL | INTRAVENOUS | Status: DC | PRN

## 2015-07-03 MED ORDER — LIDOCAINE HCL (PF) 1 % IJ SOLN
INTRAMUSCULAR | Status: AC
  Filled 2015-07-03: qty 30

## 2015-07-03 SURGICAL SUPPLY — 15 items
CATH INFINITI 5 FR JL3.5 (CATHETERS) ×2 IMPLANT
CATH INFINITI 5FR ANG PIGTAIL (CATHETERS) ×2 IMPLANT
CATH INFINITI JR4 5F (CATHETERS) ×2 IMPLANT
CATH SWAN GANZ 7F STRAIGHT (CATHETERS) ×2 IMPLANT
DEVICE RAD COMP TR BAND LRG (VASCULAR PRODUCTS) ×2 IMPLANT
GLIDESHEATH SLEND SS 6F .021 (SHEATH) ×2 IMPLANT
KIT HEART LEFT (KITS) ×2 IMPLANT
KIT HEART RIGHT NAMIC (KITS) ×2 IMPLANT
PACK CARDIAC CATHETERIZATION (CUSTOM PROCEDURE TRAY) ×2 IMPLANT
SHEATH PINNACLE 7F 10CM (SHEATH) ×2 IMPLANT
SYR MEDRAD MARK V 150ML (SYRINGE) ×2 IMPLANT
TRANSDUCER W/STOPCOCK (MISCELLANEOUS) ×4 IMPLANT
TUBING CIL FLEX 10 FLL-RA (TUBING) ×2 IMPLANT
WIRE HI TORQ VERSACORE-J 145CM (WIRE) ×2 IMPLANT
WIRE SAFE-T 1.5MM-J .035X260CM (WIRE) ×2 IMPLANT

## 2015-07-03 NOTE — Discharge Instructions (Signed)
Radial Site Care Refer to this sheet in the next few weeks. These instructions provide you with information about caring for yourself after your procedure. Your health care provider may also give you more specific instructions. Your treatment has been planned according to current medical practices, but problems sometimes occur. Call your health care provider if you have any problems or questions after your procedure. WHAT TO EXPECT AFTER THE PROCEDURE After your procedure, it is typical to have the following:  Bruising at the radial site that usually fades within 1-2 weeks.  Blood collecting in the tissue (hematoma) that may be painful to the touch. It should usually decrease in size and tenderness within 1-2 weeks. HOME CARE INSTRUCTIONS  Take medicines only as directed by your health care provider.  You may shower 24-48 hours after the procedure or as directed by your health care provider. Remove the bandage (dressing) and gently wash the site with plain soap and water. Pat the area dry with a clean towel. Do not rub the site, because this may cause bleeding.  Do not take baths, swim, or use a hot tub until your health care provider approves.  Check your insertion site every day for redness, swelling, or drainage.  Do not apply powder or lotion to the site.  Do not flex or bend the affected arm for 24 hours or as directed by your health care provider.  Do not push or pull heavy objects with the affected arm for 24 hours or as directed by your health care provider.  Do not lift over 10 lb (4.5 kg) for 5 days after your procedure or as directed by your health care provider.  Ask your health care provider when it is okay to:  Return to work or school.  Resume usual physical activities or sports.  Resume sexual activity.  Do not drive home if you are discharged the same day as the procedure. Have someone else drive you.  You may drive 24 hours after the procedure unless otherwise  instructed by your health care provider.  Do not operate machinery or power tools for 24 hours after the procedure.  If your procedure was done as an outpatient procedure, which means that you went home the same day as your procedure, a responsible adult should be with you for the first 24 hours after you arrive home.  Keep all follow-up visits as directed by your health care provider. This is important. SEEK MEDICAL CARE IF:  You have a fever.  You have chills.  You have increased bleeding from the radial site. Hold pressure on the site. SEEK IMMEDIATE MEDICAL CARE IF:  You have unusual pain at the radial site.  You have redness, warmth, or swelling at the radial site.  You have drainage (other than a small amount of blood on the dressing) from the radial site.  The radial site is bleeding, and the bleeding does not stop after 30 minutes of holding steady pressure on the site and call 911.  Your arm or hand becomes pale, cool, tingly, or numb.   This information is not intended to replace advice given to you by your health care provider. Make sure you discuss any questions you have with your health care provider.   Document Released: 07/05/2010 Document Revised: 06/23/2014 Document Reviewed: 12/19/2013 Elsevier Interactive Patient Education 2016 [employer] Refer to this sheet in the next few weeks. These instructions provide you with information about caring for yourself after  your procedure. Your health care provider may also give you more specific instructions. Your treatment has been planned according to current medical practices, but problems sometimes occur. Call your health care provider if you have any problems or questions after your procedure. WHAT TO EXPECT AFTER THE PROCEDURE After your procedure, it is typical to have the following:  Bruising at the catheter insertion site that usually fades within 1-2 weeks.  Blood collecting in the  tissue (hematoma) that may be painful to the touch. It should usually decrease in size and tenderness within 1-2 weeks. HOME CARE INSTRUCTIONS  Take medicines only as directed by your health care provider.  You may shower 24-48 hours after the procedure or as directed by your health care provider. Remove the bandage (dressing) and gently wash the site with plain soap and water. Pat the area dry with a clean towel. Do not rub the site, because this may cause bleeding.  Do not take baths, swim, or use a hot tub until your health care provider approves.  Check your insertion site every day for redness, swelling, or drainage.  Do not apply powder or lotion to the site.  Do not lift over 10 lb (4.5 kg) for 5 days after your procedure or as directed by your health care provider.  Ask your health care provider when it is okay to:  Return to work or school.  Resume usual physical activities or sports.  Resume sexual activity.  Do not drive home if you are discharged the same day as the procedure. Have someone else drive you.  You may drive 24 hours after the procedure unless otherwise instructed by your health care provider.  Do not operate machinery or power tools for 24 hours after the procedure or as directed by your health care provider.  If your procedure was done as an outpatient procedure, which means that you went home the same day as your procedure, a responsible adult should be with you for the first 24 hours after you arrive home.  Keep all follow-up visits as directed by your health care provider. This is important. SEEK MEDICAL CARE IF:  You have a fever.  You have chills.  You have increased bleeding from the catheter insertion site. Hold pressure on the site. SEEK IMMEDIATE MEDICAL CARE IF:  You have unusual pain at the catheter insertion site.  You have redness, warmth, or swelling at the catheter insertion site.  You have drainage (other than a small amount of  blood on the dressing) from the catheter insertion site.  The catheter insertion site is bleeding, and the bleeding does not stop after 30 minutes of holding steady pressure on the site.  The area near or just beyond the catheter insertion site becomes pale, cool, tingly, or numb.   This information is not intended to replace advice given to you by your health care provider. Make sure you discuss any questions you have with your health care provider.   Document Released: 12/19/2004 Document Revised: 06/23/2014 Document Reviewed: 11/03/2012 Elsevier Interactive Patient Education [employer].

## 2015-07-03 NOTE — Interval H&P Note (Signed)
History and Physical Interval Note:  07/03/2015 7:39 AM  Renee Lyons  has presented today for cardiac cath with the diagnosis of unstable angina. The various methods of treatment have been discussed with the patient and family. After consideration of risks, benefits and other options for treatment, the patient has consented to  Procedure(s): Right/Left Heart Cath and Coronary Angiography (N/A) as a surgical intervention .  The patient's history has been reviewed, patient examined, no change in status, stable for surgery.  I have reviewed the patient's chart and labs.  Questions were answered to the patient's satisfaction.    Cath Lab Visit (complete for each Cath Lab visit)  Clinical Evaluation Leading to the Procedure:   ACS: No.  Non-ACS:    Anginal Classification: CCS III  Anti-ischemic medical therapy: Minimal Therapy (1 class of medications)  Non-Invasive Test Results: No non-invasive testing performed  Prior CABG: No previous CABG         MCALHANY,CHRISTOPHER

## 2015-07-03 NOTE — H&P (View-Only) (Signed)
Chief Complaint  Patient presents with  . Follow-up    pt c/o sob with activity  . Hypertension      History of Present Illness: 65 yo female with history of IBS, HLD, GERD, seizure disorder here today for cardiac follow up. She was seen as a new patient 03/11/12 for evaluation of chest pain. She told me that she has had substernal burning with exercise with associated SOB for 2 years. This had become more frequent. Echo 03/15/12 with normal LV size and function, mild LVH, mild MR. Treadmill exercise stress test on 04/02/12 with ST depression, chest pain with exercise. Exercise stress myoview 04/08/12 with no evidence of ischemia. ST depression with exercise but no chest pain with exercise. Elevated BP with exercise. It was felt that her chest pain may be related to her HTN. Beta blocker stopped due to bradycardia and Norvasc added.   She is here today for followup. She describes profound dyspnea with minimal exertion. She also endorses substernal chest pain. This has worsened over the last few months. No near syncope, syncope or LE edema.     Primary Care Physician: [name], MD    Past Medical History  Diagnosis Date  . Goiter     stable on [place] as per Dr Altheimer  . IBS (irritable bowel syndrome)   . Allergic rhinitis   . GERD (gastroesophageal reflux disease)   . Hyperlipidemia   . Seizure disorder [facility]) 1977     Dr Mervyn Skeeters, Neurology.One episode in 1977  . Hx of cardiovascular stress test 04/09/12    a. ETT + for ischemia;  b. ETT-MV: (2013) images with no ischemia, EF  77%; +ECG changes c/w ischemia  . Hypertension   . Osteoarthritis of both hands 2016    Both hands, wrists, R knee.  Rheum eval 11/2014 [facility] (Dr. Trudie Reed)    Past Surgical History  Procedure Laterality Date  . Uterine polypectomy  2008     Dr Nori Riis  . Colonoscopy  2003 & 2007    Dr Sharlett Iles  . Bladder tacking  1974    Current Outpatient Prescriptions  Medication Sig Dispense Refill  . ALPRAZolam  (XANAX) 0.5 MG tablet Take 0.5 mg by mouth daily as needed for anxiety. 0.5 mg by mouth for anxiety  5  . amLODipine (NORVASC) 10 MG tablet Take 1 tablet by mouth  daily 90 tablet 0  . fexofenadine (ALLEGRA) 180 MG tablet 1 tab po qAM during allergy season 30 tablet 11  . fluticasone (FLONASE) 50 MCG/ACT nasal spray USE 2 SPRAYS IN BOTH NOSTRILS EVERY DAY 16 g 1  . Multiple Vitamin (MULTIVITAMIN) tablet Take 1 tablet by mouth daily.    . nitroGLYCERIN (NITROSTAT) 0.4 MG SL tablet Place 1 tablet (0.4 mg total) under the tongue every 5 (five) minutes as needed for chest pain. 25 tablet 6  . nystatin-triamcinolone ointment (MYCOLOG) Apply 1 application topically. Apply ointment to skin for rash as needed  5   No current facility-administered medications for this visit.    Allergies  Allergen Reactions  . Achromycin [Tetracycline] Hives  . Other Rash    "Mycins" - hives    Social History   Social History  . Marital Status: Married    Spouse Name: N/A  . Number of Children: 0  . Years of Education: N/A   Occupational History  . Retired, works part-time now for [employer]  . Smoking status: Never Smoker   .  Smokeless tobacco: Not on file  . Alcohol Use: 0.5 oz/week    1 drink(s) per week     Comment: Rarely  . Drug Use: No  . Sexual Activity: Not on file   Other Topics Concern  . Not on file   Social History Narrative   Married, husband is pt of mine Malkah Whalon).   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     No tobacco.   Alc: 1 drink per week.  No drugs.             Family History  Problem Relation Age of Onset  . Prostate cancer Father   . Heart attack Father 29  . Hypertension Mother   . Thalassemia Mother   . Thalassemia Maternal Aunt   . Diverticulitis Brother   . Suicidality Brother   . Stroke Neg Hx   . Diabetes Neg Hx     Review of Systems:  As stated in the HPI and otherwise negative.   BP 120/78 mmHg  Pulse 60  Ht 5'  8" (1.727 m)  Wt 181 lb 12.8 oz (82.464 kg)  BMI 27.65 kg/m2  SpO2 98%  Physical Examination: General: Well developed, well nourished, NAD HEENT: OP clear, mucus membranes moist SKIN: warm, dry. No rashes. Neuro: No focal deficits Musculoskeletal: Muscle strength 5/5 all ext Psychiatric: Mood and affect normal Neck: No JVD, no carotid bruits, no thyromegaly, no lymphadenopathy. Lungs:Clear bilaterally, no wheezes, rhonci, crackles Cardiovascular: Regular rate and rhythm. No murmurs, gallops or rubs. Abdomen:Soft. Bowel sounds present. Non-tender.  Extremities: No lower extremity edema. Pulses are 2 + in the bilateral DP/PT.  EKG:  EKG is ordered today. The ekg ordered today demonstrates sinus brady, rate 59 bpm. Poor R wave progression precordial leads  Recent Labs: 06/27/2015: BUN 23; Creat 0.90; Hemoglobin 11.9*; Platelets 277; Potassium 4.0; Sodium 137   Lipid Panel    Component Value Date/Time   CHOL 228* 01/31/2014 0821   TRIG 126.0 01/31/2014 0821   HDL 46.20 01/31/2014 0821   CHOLHDL 5 01/31/2014 0821   VLDL 25.2 01/31/2014 0821   LDLCALC 157* 01/31/2014 0821   LDLDIRECT 144.3 02/17/2012 1540     Wt Readings from Last 3 Encounters:  06/27/15 181 lb 12.8 oz (82.464 kg)  06/22/14 182 lb 6.4 oz (82.736 kg)  02/03/14 178 lb (80.74 kg)     Other studies Reviewed: Additional studies/ records that were reviewed today include: . Review of the above records demonstrates:    Assessment and Plan:   1. Unstable angina: She has chest pain and dyspnea worrisome for unstable angina. ST changes with stress test several years ago but perfusion images normal. Will plan right and left heart cath to exclude CAD, Pulm HTN. Risks and benefits reviewed with pt. Pre-cath labs today. She agrees to proceed.   2. HTN: Well controlled. Will continue Norvasc. Renal artery dopplers ok.  3. Mitral regurgitation: Mild by echo 2013. Will repeat echo next visit.   Current medicines are  reviewed at length with the patient today.  The patient does not have concerns regarding medicines.  The following changes have been made:  no change  Labs/ tests ordered today include: right and left heart cath  Orders Placed This Encounter  Procedures  . Basic Metabolic Panel (BMET)  . CBC w/Diff  . INR/PT  . EKG 12-Lead     Disposition:   FU with me in 6 weeks   Signed, Lauree Chandler, MD 06/28/2015 7:53 AM  [facility] [place], [place], [place]  [address] Phone: [phone]; Fax: [phone]

## 2015-07-03 NOTE — Progress Notes (Signed)
23fr sheath aspirated and removed from rfv, manual pressure applied for 15 minutes. Tegaderm dressing applied. Bedrest instructions given.  Groin level 0.  Bed rest begins at 11:15:00

## 2015-07-04 ENCOUNTER — Encounter (HOSPITAL_COMMUNITY): Payer: Self-pay | Admitting: Cardiovascular Disease

## 2015-07-09 DIAGNOSIS — H5203 Hypermetropia, bilateral: Secondary | ICD-10-CM | POA: Diagnosis not present

## 2015-07-09 DIAGNOSIS — H524 Presbyopia: Secondary | ICD-10-CM | POA: Diagnosis not present

## 2015-07-09 DIAGNOSIS — H2513 Age-related nuclear cataract, bilateral: Secondary | ICD-10-CM | POA: Diagnosis not present

## 2015-07-30 ENCOUNTER — Ambulatory Visit (INDEPENDENT_AMBULATORY_CARE_PROVIDER_SITE_OTHER): Payer: Medicare Other | Admitting: Cardiovascular Disease

## 2015-07-30 ENCOUNTER — Encounter: Payer: Self-pay | Admitting: Cardiovascular Disease

## 2015-07-30 VITALS — BP 110/66 | HR 60 | Ht 63.5 in | Wt 181.4 lb

## 2015-07-30 DIAGNOSIS — R079 Chest pain, unspecified: Secondary | ICD-10-CM

## 2015-07-30 DIAGNOSIS — I340 Nonrheumatic mitral (valve) insufficiency: Secondary | ICD-10-CM

## 2015-07-30 DIAGNOSIS — I10 Essential (primary) hypertension: Secondary | ICD-10-CM | POA: Diagnosis not present

## 2015-07-30 NOTE — Patient Instructions (Signed)

## 2015-07-30 NOTE — Progress Notes (Signed)
Chief Complaint  Patient presents with  . Follow-up  . Hypertension    History of Present Illness: 65 yo female with history of IBS, HLD, GERD, seizure disorder here today for cardiac follow up. She was seen as a new patient 03/11/12 for evaluation of chest pain. She told me that she has had substernal burning with exercise with associated SOB for 2 years. This had become more frequent. Echo 03/15/12 with normal LV size and function, mild LVH, mild MR. Treadmill exercise stress test on 04/02/12 with ST depression, chest pain with exercise. Exercise stress myoview 04/08/12 with no evidence of ischemia. ST depression with exercise but no chest pain with exercise. Elevated BP with exercise. It was felt that her chest pain may be related to her HTN. Beta blocker stopped due to bradycardia and Norvasc added. I saw her in January and she c/o dyspnea with minimal exertion and substernal chest pain. Cardiac cath 06/27/15 with no evidence of CAD, normal PA pressures.   She is here today for followup. No near syncope, syncope or LE edema.   No chest pain. She is now back to exercising.   Primary Care Physician: [name], MD   Past Medical History  Diagnosis Date  . Goiter     stable on [place] as per Dr Altheimer  . IBS (irritable bowel syndrome)   . Allergic rhinitis   . GERD (gastroesophageal reflux disease)   . Hyperlipidemia   . Seizure disorder [facility]) 1977     Dr Mervyn Skeeters, Neurology.One episode in 1977  . Hx of cardiovascular stress test 04/09/12    a. ETT + for ischemia;  b. ETT-MV: (2013) images with no ischemia, EF  77%; +ECG changes c/w ischemia  . Hypertension   . Osteoarthritis of both hands 2016    Both hands, wrists, R knee.  Rheum eval 11/2014 NEG (Dr. Trudie Reed)    Past Surgical History  Procedure Laterality Date  . Uterine polypectomy  2008     Dr Nori Riis  . Colonoscopy  2003 & 2007    Dr Sharlett Iles  . Bladder tacking  1974  . Cardiac catheterization N/A 07/03/2015    Procedure:  Right/Left Heart Cath and Coronary Angiography;  Surgeon: Burnell Blanks, MD;  Location: [facility];  Service: Cardiovascular;  Laterality: N/A;    Current Outpatient Prescriptions  Medication Sig Dispense Refill  . ALPRAZolam (XANAX) 0.5 MG tablet Take 0.5 mg by mouth daily as needed for anxiety.   5  . amLODipine (NORVASC) 10 MG tablet Take 1 tablet by mouth  daily 90 tablet 0  . Cholecalciferol (VITAMIN D) 2000 units CAPS Take 2,000 Units by mouth daily.    . fexofenadine (ALLEGRA) 180 MG tablet Take 180 mg by mouth daily.    . fluticasone (FLONASE) 50 MCG/ACT nasal spray Place 2 sprays into both nostrils as needed for allergies or rhinitis.    . Multiple Vitamin (MULTIVITAMIN) tablet Take 1 tablet by mouth daily.    . nitroGLYCERIN (NITROSTAT) 0.4 MG SL tablet Place 1 tablet (0.4 mg total) under the tongue every 5 (five) minutes as needed for chest pain. 25 tablet 6   No current facility-administered medications for this visit.    Allergies  Allergen Reactions  . Achromycin [Tetracycline] Hives  . Other Rash    "Mycins" - hives    Social History   Social History  . Marital Status: Married    Spouse Name: N/A  . Number of Children: 0  . Years  of Education: N/A   Occupational History  . Retired, works part-time now for [employer]  . Smoking status: Never Smoker   . Smokeless tobacco: Not on file  . Alcohol Use: 0.5 oz/week    1 drink(s) per week     Comment: Rarely  . Drug Use: No  . Sexual Activity: Not on file   Other Topics Concern  . Not on file   Social History Narrative   Married, husband is pt of mine Keirah Jordon).   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     No tobacco.   Alc: 1 drink per week.  No drugs.             Family History  Problem Relation Age of Onset  . Prostate cancer Father   . Heart attack Father 54  . Hypertension Mother   . Thalassemia Mother   . Thalassemia Maternal Aunt   .  Diverticulitis Brother   . Suicidality Brother   . Stroke Neg Hx   . Diabetes Neg Hx     Review of Systems:  As stated in the HPI and otherwise negative.   BP 110/66 mmHg  Pulse 60  Ht 5' 3.5" (1.613 m)  Wt 181 lb 6.4 oz (82.283 kg)  BMI 31.63 kg/m2  SpO2 98%  Physical Examination: General: Well developed, well nourished, NAD HEENT: OP clear, mucus membranes moist SKIN: warm, dry. No rashes. Neuro: No focal deficits Musculoskeletal: Muscle strength 5/5 all ext Psychiatric: Mood and affect normal Neck: No JVD, no carotid bruits, no thyromegaly, no lymphadenopathy. Lungs:Clear bilaterally, no wheezes, rhonci, crackles Cardiovascular: Regular rate and rhythm. No murmurs, gallops or rubs. Abdomen:Soft. Bowel sounds present. Non-tender.  Extremities: No lower extremity edema. Pulses are 2 + in the bilateral DP/PT.  Cardiac cath 07/03/15: 1. No angiographic evidence of CAD 2. Normal LV systolic function.  3. Normal PA pressures. 4. Normal LV filling pressures.   EKG:  EKG is not ordered today. The ekg ordered today demonstrates   Recent Labs: 06/27/2015: BUN 23; Creat 0.90; Hemoglobin 11.9*; Platelets 277; Potassium 4.0; Sodium 137   Lipid Panel    Component Value Date/Time   CHOL 228* 01/31/2014 0821   TRIG 126.0 01/31/2014 0821   HDL 46.20 01/31/2014 0821   CHOLHDL 5 01/31/2014 0821   VLDL 25.2 01/31/2014 0821   LDLCALC 157* 01/31/2014 0821   LDLDIRECT 144.3 02/17/2012 1540     Wt Readings from Last 3 Encounters:  07/30/15 181 lb 6.4 oz (82.283 kg)  07/03/15 180 lb (81.647 kg)  06/27/15 181 lb 12.8 oz (82.464 kg)     Other studies Reviewed: Additional studies/ records that were reviewed today include: . Review of the above records demonstrates:    Assessment and Plan:   1. Chest pain: Non-cardiac. No evidence of CAD with recent cath.    2. HTN: Well controlled. Will continue Norvasc. Renal artery dopplers ok.  3. Mitral regurgitation: Mild by echo 2013.  Will repeat echo now.   Current medicines are reviewed at length with the patient today.  The patient does not have concerns regarding medicines.  The following changes have been made:  no change  Labs/ tests ordered today include: right and left heart cath  Orders Placed This Encounter  Procedures  . Echocardiogram     Disposition:   FU with me in 73months   Signed, Lauree Chandler, MD 07/30/2015 12:14 PM    [facility]  [address], [place], [place]  [address] Phone: [phone]; Fax: [phone]

## 2015-09-21 ENCOUNTER — Other Ambulatory Visit: Payer: Self-pay | Admitting: Cardiovascular Disease

## 2015-09-24 ENCOUNTER — Other Ambulatory Visit: Payer: Self-pay

## 2015-09-24 ENCOUNTER — Ambulatory Visit (HOSPITAL_COMMUNITY): Payer: Medicare Other | Attending: Cardiovascular Disease

## 2015-09-24 DIAGNOSIS — Z6831 Body mass index (BMI) 31.0-31.9, adult: Secondary | ICD-10-CM | POA: Insufficient documentation

## 2015-09-24 DIAGNOSIS — E669 Obesity, unspecified: Secondary | ICD-10-CM | POA: Insufficient documentation

## 2015-09-24 DIAGNOSIS — I059 Rheumatic mitral valve disease, unspecified: Secondary | ICD-10-CM | POA: Diagnosis present

## 2015-09-24 DIAGNOSIS — I119 Hypertensive heart disease without heart failure: Secondary | ICD-10-CM | POA: Insufficient documentation

## 2015-09-24 DIAGNOSIS — E785 Hyperlipidemia, unspecified: Secondary | ICD-10-CM | POA: Diagnosis not present

## 2015-09-24 DIAGNOSIS — I340 Nonrheumatic mitral (valve) insufficiency: Secondary | ICD-10-CM | POA: Diagnosis not present

## 2015-09-24 HISTORY — PX: TRANSTHORACIC ECHOCARDIOGRAM: SHX275

## 2015-11-05 DIAGNOSIS — Z1231 Encounter for screening mammogram for malignant neoplasm of breast: Secondary | ICD-10-CM | POA: Diagnosis not present

## 2015-11-05 DIAGNOSIS — N958 Other specified menopausal and perimenopausal disorders: Secondary | ICD-10-CM | POA: Diagnosis not present

## 2015-11-05 DIAGNOSIS — Z13820 Encounter for screening for osteoporosis: Secondary | ICD-10-CM | POA: Diagnosis not present

## 2015-11-05 DIAGNOSIS — M8588 Other specified disorders of bone density and structure, other site: Secondary | ICD-10-CM | POA: Diagnosis not present

## 2015-11-05 LAB — HM MAMMOGRAPHY

## 2015-11-05 LAB — HM DEXA SCAN

## 2015-11-09 ENCOUNTER — Ambulatory Visit (INDEPENDENT_AMBULATORY_CARE_PROVIDER_SITE_OTHER): Payer: Medicare Other | Admitting: Family Medicine

## 2015-11-09 ENCOUNTER — Encounter: Payer: Self-pay | Admitting: Family Medicine

## 2015-11-09 VITALS — BP 137/67 | HR 58 | Temp 98.1°F | Resp 16 | Ht 63.5 in | Wt 177.8 lb

## 2015-11-09 DIAGNOSIS — H8113 Benign paroxysmal vertigo, bilateral: Secondary | ICD-10-CM

## 2015-11-09 NOTE — Progress Notes (Signed)
Pre visit review using our clinic review tool, if applicable. No additional management support is needed unless otherwise documented below in the visit note. 

## 2015-11-09 NOTE — Progress Notes (Signed)
OFFICE VISIT  11/09/2015   CC:  Chief Complaint  Renee Lyons presents with  . Dizziness  . Nausea     HPI:    Renee Lyons is a 65 y.o. Caucasian female who presents for onset of disequilibrium feeling 2 days ago upon getting out of bed.  This lasted through the day but in milder intensity.  No vertigo yet. Yesterday morning she got true vertigo symptoms upon sitting up in bed after awakening.  This recurred more as the day went on, episodes lasting seconds to a minute at most.  Closing eyes and keeping head still helped.  Nausea accompanied her sx's today but this has improved some last few hours.  Describes sizzling noise in both ears last 24h or so.  No fever.  No abd pain.  +mild tightness/tension across forehead.  No vision abnormalities.  No significant feeling of any hearing loss.  No URI lately but a few mild allergy sx's lately.  Past Medical History  Diagnosis Date  . Goiter     stable on [place] as per Dr Altheimer  . IBS (irritable bowel syndrome)   . Allergic rhinitis   . GERD (gastroesophageal reflux disease)   . Hyperlipidemia   . Seizure disorder [facility]) 1977     Dr Mervyn Skeeters, Neurology.One episode in 1977  . Hx of cardiovascular stress test 04/09/12    a. ETT + for ischemia;  b. ETT-MV: (2013) images with no ischemia, EF  77%; +ECG changes c/w ischemia  . Hypertension   . Osteoarthritis of both hands 2016    Both hands, wrists, R knee.  Rheum eval 11/2014 NEG (Dr. Trudie Reed)    Past Surgical History  Procedure Laterality Date  . Uterine polypectomy  2008     Dr Nori Riis  . Colonoscopy  2003 & 2007    Dr Sharlett Iles  . Bladder tacking  1974  . Cardiac catheterization N/A 07/03/2015    Procedure: Right/Left Heart Cath and Coronary Angiography;  Surgeon: Burnell Blanks, MD;  Location: [place];  Service: Cardiovascular;  Laterality: N/A;    Outpatient Prescriptions Prior to Visit  Medication Sig Dispense Refill  . ALPRAZolam (XANAX) 0.5 MG tablet Take 0.5 mg by mouth  daily as needed for anxiety.   5  . amLODipine (NORVASC) 10 MG tablet Take 1 tablet by mouth  daily 90 tablet 3  . Cholecalciferol (VITAMIN D) 2000 units CAPS Take 2,000 Units by mouth daily.    . fexofenadine (ALLEGRA) 180 MG tablet Take 180 mg by mouth daily.    . fluticasone (FLONASE) 50 MCG/ACT nasal spray Place 2 sprays into both nostrils as needed for allergies or rhinitis.    . Multiple Vitamin (MULTIVITAMIN) tablet Take 1 tablet by mouth daily.    . nitroGLYCERIN (NITROSTAT) 0.4 MG SL tablet Place 1 tablet (0.4 mg total) under the tongue every 5 (five) minutes as needed for chest pain. 25 tablet 6   No facility-administered medications prior to visit.    Allergies  Allergen Reactions  . Achromycin [Tetracycline] Hives  . Other Rash    "Mycins" - hives    ROS As per HPI  PE: Blood pressure 137/67, pulse 58, temperature 98.1 F (36.7 C), temperature source Oral, resp. rate 16, height 5' 3.5" (1.613 m), weight 177 lb 12 oz (80.627 kg), SpO2 98 %. Gen: Alert, well appearing.  Renee Lyons is oriented to person, place, time, and situation. ENT: Ears: EACs clear, normal epithelium.  TMs with good light reflex and landmarks bilaterally.  Eyes: no injection, icteris, swelling, or exudate.  EOMI, PERRLA. Nose: no drainage or turbinate edema/swelling.  No injection or focal lesion.  Mouth: lips without lesion/swelling.  Oral mucosa pink and moist.  Dentition intact and without obvious caries or gingival swelling.  Oropharynx without erythema, exudate, or swelling.  CV: RRR, no m/r/g.   LUNGS: CTA bilat, nonlabored resps, good aeration in all lung fields. Neuro: CN 2-12 intact bilaterally, strength 5/5 in proximal and distal upper extremities and lower extremities bilaterally.  No tremor.  No disdiadochokinesis.  No ataxia.  Upper extremity and lower extremity DTRs symmetric.  No pronator drift. Dix-Halpike maneuver: minimal vertigo and rotary nystagmus with head turned to right. Marked  vertigo and rotary nystagmus with head turned to left.  LABS:  none  IMPRESSION AND PLAN:  BPPV: discussed/explained dx to pt. Reviewed epley's home maneuvers with her and gave her a handout. Signs/symptoms to call or return for were reviewed and pt expressed understanding.  An After Visit Summary was printed and given to the Renee Lyons.  Spent 25 min with pt today, with >50% of this time spent in counseling and care coordination regarding the above problems.  FOLLOW UP: Return if symptoms worsen or fail to improve.  Signed:  Crissie Sickles, MD           11/09/2015

## 2015-11-19 ENCOUNTER — Other Ambulatory Visit: Payer: Self-pay | Admitting: Family Medicine

## 2015-11-26 DIAGNOSIS — Z6832 Body mass index (BMI) 32.0-32.9, adult: Secondary | ICD-10-CM | POA: Diagnosis not present

## 2015-11-26 DIAGNOSIS — Z1329 Encounter for screening for other suspected endocrine disorder: Secondary | ICD-10-CM | POA: Diagnosis not present

## 2015-11-26 DIAGNOSIS — Z01419 Encounter for gynecological examination (general) (routine) without abnormal findings: Secondary | ICD-10-CM | POA: Diagnosis not present

## 2015-11-26 DIAGNOSIS — E559 Vitamin D deficiency, unspecified: Secondary | ICD-10-CM | POA: Diagnosis not present

## 2015-11-26 DIAGNOSIS — Z779 Other contact with and (suspected) exposures hazardous to health: Secondary | ICD-10-CM | POA: Diagnosis not present

## 2015-11-26 DIAGNOSIS — Z1321 Encounter for screening for nutritional disorder: Secondary | ICD-10-CM | POA: Diagnosis not present

## 2015-11-26 DIAGNOSIS — M6281 Muscle weakness (generalized): Secondary | ICD-10-CM | POA: Diagnosis not present

## 2015-11-26 LAB — TSH: TSH: 2.14 u[IU]/mL (ref 0.41–5.90)

## 2015-11-26 LAB — HM PAP SMEAR: HM Pap smear: NEGATIVE

## 2015-12-04 DIAGNOSIS — L918 Other hypertrophic disorders of the skin: Secondary | ICD-10-CM | POA: Diagnosis not present

## 2015-12-04 DIAGNOSIS — L820 Inflamed seborrheic keratosis: Secondary | ICD-10-CM | POA: Diagnosis not present

## 2015-12-04 DIAGNOSIS — Z85828 Personal history of other malignant neoplasm of skin: Secondary | ICD-10-CM | POA: Diagnosis not present

## 2016-03-28 ENCOUNTER — Ambulatory Visit: Payer: Medicare Other

## 2016-03-31 ENCOUNTER — Encounter (HOSPITAL_BASED_OUTPATIENT_CLINIC_OR_DEPARTMENT_OTHER): Payer: Self-pay

## 2016-03-31 ENCOUNTER — Emergency Department (HOSPITAL_BASED_OUTPATIENT_CLINIC_OR_DEPARTMENT_OTHER)
Admission: EM | Admit: 2016-03-31 | Discharge: 2016-03-31 | Disposition: A | Discharge status: Home / Self Care | Payer: Medicare Other | Attending: Emergency Medicine | Admitting: Emergency Medicine

## 2016-03-31 DIAGNOSIS — Y9289 Other specified places as the place of occurrence of the external cause: Secondary | ICD-10-CM | POA: Diagnosis not present

## 2016-03-31 DIAGNOSIS — F430 Acute stress reaction: Secondary | ICD-10-CM | POA: Diagnosis not present

## 2016-03-31 DIAGNOSIS — I10 Essential (primary) hypertension: Secondary | ICD-10-CM | POA: Insufficient documentation

## 2016-03-31 DIAGNOSIS — X500XXA Overexertion from strenuous movement or load, initial encounter: Secondary | ICD-10-CM | POA: Diagnosis not present

## 2016-03-31 DIAGNOSIS — Y93F2 Activity, caregiving, lifting: Secondary | ICD-10-CM | POA: Insufficient documentation

## 2016-03-31 DIAGNOSIS — Z79899 Other long term (current) drug therapy: Secondary | ICD-10-CM | POA: Insufficient documentation

## 2016-03-31 DIAGNOSIS — R42 Dizziness and giddiness: Secondary | ICD-10-CM | POA: Diagnosis not present

## 2016-03-31 DIAGNOSIS — H9313 Tinnitus, bilateral: Secondary | ICD-10-CM | POA: Diagnosis not present

## 2016-03-31 DIAGNOSIS — Z733 Stress, not elsewhere classified: Secondary | ICD-10-CM | POA: Diagnosis not present

## 2016-03-31 DIAGNOSIS — F54 Psychological and behavioral factors associated with disorders or diseases classified elsewhere: Secondary | ICD-10-CM

## 2016-03-31 NOTE — ED Triage Notes (Signed)
Pt has been under a lot of stress due to decline in mother's health and having to move her into a facility, tonight got very anxious and her blood pressure became elevated.  Pt c/o head pressure, crying and increased anxiety.  Pt denies SOB, denies chest pain.  She had some dizziness yesterday, but today she was able to get some sleep and the dizziness has subsided.

## 2016-03-31 NOTE — ED Provider Notes (Addendum)
[facility] Provider Note   CSN: [id number] Arrival date & time: 03/31/16  2231  By signing my name below, I, Renee Lyons, attest that this documentation has been prepared under the direction and in the presence of [name], MD. Electronically Signed: Soijett Lyons, ED Scribe. 03/31/16. 11:29 PM.   History   Chief Complaint Chief Complaint  Patient presents with  . Hypertension    HPI Renee Lyons is a 65 y.o. female with a PMHx of HTN, who presents to the Emergency Department complaining of HTN. Pt notes that she has been doing a lot of heavy lifting and under stress while moving her mother into a extended care facility. Pt reports that she takes norvasc for her HTN. Pt states that she has a wellness appointment in 4 days with her PCP. Pt is having associated symptoms of vertigo x 2 days and constant tinnitus to bilateral ears right > left s/p fall. She notes that she has not tried any medications for the relief of her symptoms. She denies ear pain, hearing loss, fever, chills, and any other symptoms. Denies PMHx of high cholesterol, DM, or thyroid issues. No weakness nor numbness no changes in vision or speech.     The history is provided by the patient. No language interpreter was used.  Hypertension  This is a new problem. The current episode started more than 2 days ago. The problem occurs constantly. The problem has not changed since onset.Pertinent negatives include no chest pain, no abdominal pain, no headaches and no shortness of breath. The symptoms are aggravated by stress. Nothing relieves the symptoms. She has tried nothing for the symptoms. The treatment provided moderate relief.    Past Medical History:  Diagnosis Date  . Allergic rhinitis   . GERD (gastroesophageal reflux disease)   . Goiter    stable on [place] as per Dr Altheimer  . Hx of cardiovascular stress test 04/09/12   a. ETT + for ischemia;  b. ETT-MV: (2013) images with no ischemia, EF  77%; +ECG  changes c/w ischemia  . Hyperlipidemia   . Hypertension   . IBS (irritable bowel syndrome)   . Osteoarthritis of both hands 2016   Both hands, wrists, R knee.  Rheum eval 11/2014 NEG (Dr. Trudie Reed)  . Seizure disorder [facility]) 1977    Dr Mervyn Skeeters, Neurology.One episode in 1977    Patient Active Problem List   Diagnosis Date Noted  . Dyspnea   . Health maintenance examination 02/03/2014  . Cough 10/12/2013  . Sinusitis, acute maxillary 11/29/2012  . TINNITUS 07/05/2010  . SEIZURE DISORDER 07/05/2010  . HYPERLIPIDEMIA 03/15/2008  . GERD 03/15/2008  . ANXIETY DISORDER, MILD, HX OF 03/15/2008  . PALPITATIONS 11/16/2007  . UNSPECIFIED ANEMIA 08/20/2007  . GOITER NOS 03/09/2007  . ALLERGIC RHINITIS 03/09/2007    Past Surgical History:  Procedure Laterality Date  . bladder tacking  1974  . CARDIAC CATHETERIZATION N/A 07/03/2015   Procedure: Right/Left Heart Cath and Coronary Angiography;  Surgeon: Burnell Blanks, MD;  Location: [facility];  Service: Cardiovascular;  Laterality: N/A;  . COLONOSCOPY  2003 & 2007   Dr Sharlett Iles  . uterine polypectomy  2008    Dr Nori Riis    OB History    No data available       Home Medications    Prior to Admission medications   Medication Sig Start Date End Date Taking? Authorizing Provider  ALPRAZolam Duanne Moron) 0.5 MG tablet Take 0.5 mg by mouth  daily as needed for anxiety.  03/20/14   Historical Provider, MD  amLODipine (NORVASC) 10 MG tablet Take 1 tablet by mouth  daily 09/21/15   Burnell Blanks, MD  Cholecalciferol (VITAMIN D) 2000 units CAPS Take 2,000 Units by mouth daily.    Historical Provider, MD  fexofenadine (ALLEGRA) 180 MG tablet Take 180 mg by mouth daily.    Historical Provider, MD  fluticasone (FLONASE) 50 MCG/ACT nasal spray Place 2 sprays into both nostrils as needed for allergies or rhinitis.    Historical Provider, MD  fluticasone (FLONASE) 50 MCG/ACT nasal spray SHAKE LIQUID AND USE 2 SPRAYS IN EACH NOSTRIL  EVERY DAY 11/19/15   [name], MD  Multiple Vitamin (MULTIVITAMIN) tablet Take 1 tablet by mouth daily.    Historical Provider, MD  nitroGLYCERIN (NITROSTAT) 0.4 MG SL tablet Place 1 tablet (0.4 mg total) under the tongue every 5 (five) minutes as needed for chest pain. 01/15/15 04/05/17  Burnell Blanks, MD    Family History Family History  Problem Relation Age of Onset  . Prostate cancer Father   . Heart attack Father 82  . Hypertension Mother   . Thalassemia Mother   . Diverticulitis Brother   . Suicidality Brother   . Thalassemia Maternal Aunt   . Stroke Neg Hx   . Diabetes Neg Hx     Social History Social History  Substance Use Topics  . Smoking status: Never Smoker  . Smokeless tobacco: Not on file  . Alcohol use 0.5 oz/week    1 drink(s) per week     Comment: Rarely     Allergies   Achromycin [tetracycline] and Other   Review of Systems Review of Systems  Constitutional: Negative for chills and fever.  HENT: Positive for tinnitus (bilateral, right > left). Negative for ear pain and hearing loss.   Eyes: Negative for visual disturbance.  Respiratory: Negative for shortness of breath.   Cardiovascular: Negative for chest pain.  Gastrointestinal: Negative for abdominal pain.  Neurological: Positive for dizziness. Negative for tremors, seizures, syncope, facial asymmetry, speech difficulty, weakness, light-headedness, numbness and headaches.  Psychiatric/Behavioral: The patient is nervous/anxious.   All other systems reviewed and are negative.   Physical Exam Updated Vital Signs BP 121/71 (BP Location: Left Arm)   Pulse 68   Temp 98.1 F (36.7 C) (Oral)   Resp 18   Ht 5\' 4"  (1.626 m)   Wt 177 lb (80.3 kg)   SpO2 99%   BMI 30.38 kg/m   Physical Exam  Constitutional: She is oriented to person, place, and time. She appears well-developed and well-nourished. No distress.  HENT:  Head: Normocephalic and atraumatic.  Right Ear: External ear and  ear canal normal. Tympanic membrane is scarred.  Left Ear: External ear and ear canal normal. Tympanic membrane is scarred.  Mouth/Throat: Oropharynx is clear and moist. No oropharyngeal exudate.  Cholesteatoma with scarring to bilateral TM, right > left.   Eyes: Conjunctivae and EOM are normal. Pupils are equal, round, and reactive to light.  Neck: Trachea normal and normal range of motion. Neck supple. No tracheal deviation present.  Cardiovascular: Normal rate, regular rhythm, normal heart sounds and intact distal pulses.  Exam reveals no gallop and no friction rub.   No murmur heard. Pulmonary/Chest: Effort normal and breath sounds normal. No respiratory distress. She has no wheezes. She has no rales.  Abdominal: Soft. Bowel sounds are normal. She exhibits no distension. There is no tenderness. There is no rebound  and no guarding.  Musculoskeletal: Normal range of motion.  Lymphadenopathy:    She has no cervical adenopathy.  Neurological: She is alert and oriented to person, place, and time. She has normal strength and normal reflexes. She displays normal reflexes. No cranial nerve deficit or sensory deficit. She exhibits normal muscle tone. She displays a negative Romberg sign. Coordination and gait normal. GCS eye subscore is 4. GCS verbal subscore is 5. GCS motor subscore is 6.  Cranial nerves 2-12 intact. 5/5 strength to BUE and BLE. DTR's intact. Negative romberg. Negative pronator drift.  Skin: Skin is warm and dry. She is not diaphoretic.  Psychiatric: Her behavior is normal. Her mood appears anxious.  Pt anxious and tearful.   Nursing note and vitals reviewed.   ED Treatments / Results  DIAGNOSTIC STUDIES: Oxygen Saturation is 99% on RA, nl by my interpretation.    Vitals:   03/31/16 2309 03/31/16 2330  BP: 121/71 130/64  Pulse: 68 66  Resp: 18 18  Temp:      COORDINATION OF CARE: 11:28 PM Discussed treatment plan with pt at bedside which includes referral to ENT  specialist and follow up with PCP PRN and pt agreed to plan.   Procedures Procedures (including critical care time)  Lengthy discussion with patient and family about stress management and how to accurately take BP.  Patient must be at rest without interruption for 5-10 minutes.  Also discussed stress management and close follow up with your PMD to discuss ongoing family issues.  I suspect as much as she has been crying and sniffling with her stress she has caused congestion and eustachian tube dysfunction activating vertiginous symptoms.  I have advised given her ongoing tinnitus and ear issues that she follow up with ENT for further diagnosis and treatment.  No signs of CVA or TIA.  I suspect much of her issues are due to stress and anxiety related to same.  EDP states that the patient must take care of herself first and foremost or she cannot take care of her elderly mother.  EDP stated we can do labs and CT of the head for the patient and patient and husband declined.  Her BP in the ED is normal and does not require intervention.  Return for ataxia, weakness numbness changes in vision or speech chest pain shortness of breath or any concerns.  All questions answered to patient's parents satisfaction. Based on history and exam patient has been appropriately medically screened and emergency conditions excluded. Patient is stable for discharge at this time. Follow up with your PMD for recheck in 2 days and strict return precautions given.  Final Clinical Impressions(s) / ED Diagnoses   I personally performed the services described in this documentation, which was scribed in my presence. The recorded information has been reviewed and is accurate.      Veatrice Kells, MD 04/01/16 [id number]    Delaina Fetsch, MD 04/01/16 [phone]

## 2016-03-31 NOTE — ED Notes (Signed)
Pt verbalizes understanding of d/c instructions and denies any further needs at this time. 

## 2016-04-03 NOTE — Progress Notes (Signed)
Subjective:   Renee Lyons is a 65 y.o. female who presents for an Initial Medicare Annual Wellness Visit.  The Patient was informed that the wellness visit is to identify future health risk and educate and initiate measures that can reduce risk for increased disease through the lifespan.   Describes health as fair, good or great? "good"  Review of Systems    No ROS.  Medicare Wellness Visit.  Sleep patterns: sleeps 4-5 each night, up to void 2-3 times. Difficulty going back to sleep after getting up.  Home Safety/Smoke Alarms:  Smoke detectors and security in place. Living environment; residence and Firearm Safety: Lives with husband in one story home. Firearms locked away.  Seat Belt Safety/Bike Helmet: Wears seatbelt.   Counseling:   Eye Exam- Last exam 07/09/15, followed yearly by Dr. Margot Ables exam 06/20/15, followed yearly by Dr. Randol Kern   Female:   Pap-11/2015; pt reports negative.  Mammo- 11/2015; pt reports negative Dexa scan-11/2015; pt reports negative  CCS-colonoscopy 09/2005; internal hemorrhoids. Recall prn. Would like cologuard testing. Order faxed.   Tests results (pap, Mammo and Dexa) requested from OB/GYN.   Cardiac Risk Factors include: advanced age (>64men, >32 women);dyslipidemia;family history of premature cardiovascular disease     Objective:    Today's Vitals   04/04/16 0807  BP: (!) 148/70  Pulse: 60  SpO2: 99%  Weight: 181 lb (82.1 kg)  Height: 5\' 4"  (1.626 m)   Body mass index is 31.07 kg/m.   Current Medications (verified) Outpatient Encounter Prescriptions as of 04/04/2016  Medication Sig  . ALPRAZolam (XANAX) 0.5 MG tablet Take 0.5 mg by mouth daily as needed for anxiety.   Marland Kitchen amLODipine (NORVASC) 10 MG tablet Take 1 tablet by mouth  daily  . Cholecalciferol (VITAMIN D) 2000 units CAPS Take 2,000 Units by mouth daily.  . fexofenadine (ALLEGRA) 180 MG tablet Take 180 mg by mouth daily.  . fluticasone (FLONASE) 50 MCG/ACT  nasal spray SHAKE LIQUID AND USE 2 SPRAYS IN EACH NOSTRIL EVERY DAY  . Multiple Vitamin (MULTIVITAMIN) tablet Take 1 tablet by mouth daily.  . nitroGLYCERIN (NITROSTAT) 0.4 MG SL tablet Place 1 tablet (0.4 mg total) under the tongue every 5 (five) minutes as needed for chest pain.  . [DISCONTINUED] fluticasone (FLONASE) 50 MCG/ACT nasal spray Place 2 sprays into both nostrils as needed for allergies or rhinitis.   No facility-administered encounter medications on file as of 04/04/2016.     Allergies (verified) Achromycin [tetracycline] and Other   History: Past Medical History:  Diagnosis Date  . Allergic rhinitis   . GERD (gastroesophageal reflux disease)   . Goiter    stable on [place] as per Dr Altheimer  . Hx of cardiovascular stress test 04/09/12   a. ETT + for ischemia;  b. ETT-MV: (2013) images with no ischemia, EF  77%; +ECG changes c/w ischemia  . Hyperlipidemia   . Hypertension   . IBS (irritable bowel syndrome)   . Osteoarthritis of both hands 2016   Both hands, wrists, R knee.  Rheum eval 11/2014 NEG (Dr. Trudie Reed)  . Seizure disorder [facility]) 1977    Dr Mervyn Skeeters, Neurology.One episode in 1977   Past Surgical History:  Procedure Laterality Date  . bladder tacking  1974  . CARDIAC CATHETERIZATION N/A 07/03/2015   Procedure: Right/Left Heart Cath and Coronary Angiography;  Surgeon: Burnell Blanks, MD;  Location: [place] [facility];  Service: Cardiovascular;  Laterality: N/A;  . COLONOSCOPY  2003 & 2007  Dr Sharlett Iles  . uterine polypectomy  2008    Dr Nori Riis   Family History  Problem Relation Age of Onset  . Prostate cancer Father   . Heart attack Father 4  . Hypertension Mother   . Thalassemia Mother   . Diverticulitis Brother   . Suicidality Brother   . Thalassemia Maternal Aunt   . Stroke Neg Hx   . Diabetes Neg Hx    Social History   Occupational History  . Retired, works part-time now for [employer] Retired   Social History Main Topics  . Smoking status:  Never Smoker  . Smokeless tobacco: Never Used  . Alcohol use 0.5 oz/week    1 Standard drinks or equivalent per week     Comment: Rarely  . Drug use: No  . Sexual activity: Not on file    Tobacco Counseling Counseling given: Not Answered   Activities of Daily Living In your present state of health, do you have any difficulty performing the following activities: 04/04/2016 07/03/2015  Hearing? N N  Vision? N N  Difficulty concentrating or making decisions? N N  Walking or climbing stairs? N N  Dressing or bathing? N N  Doing errands, shopping? N -  Preparing Food and eating ? N -  Using the Toilet? N -  In the past six months, have you accidently leaked urine? N -  Do you have problems with loss of bowel control? N -  Managing your Medications? N -  Managing your Finances? N -  Housekeeping or managing your Housekeeping? N -  Some recent data might be hidden    Immunizations and Health Maintenance Immunization History  Administered Date(s) Administered  . Influenza, High Dose Seasonal PF 04/04/2016  . Influenza,inj,Quad PF,36+ Mos 03/25/2013, 03/22/2014  . Influenza-Unspecified 03/24/2015  . Pneumococcal Conjugate-13 02/03/2014  . Pneumococcal Polysaccharide-23 04/04/2016  . Tdap 02/17/2012  . Zoster 12/03/2011   Health Maintenance Due  Topic Date Due  . Hepatitis C Screening  Nov 11, 1950  . DEXA SCAN  10/13/2013  . PNA vac Low Risk Adult (2 of 2 - PPSV23) 02/04/2015  . COLONOSCOPY  10/02/2015  . MAMMOGRAM  12/15/2015  . INFLUENZA VACCINE  01/15/2016    Patient Care Team: [name], MD as PCP - General (Family Medicine) Gavin Pound, MD as Consulting Physician (Rheumatology) Burnell Blanks, MD as Consulting Physician (Cardiology) Maisie Fus, MD as Consulting Physician (Obstetrics and Gynecology) Rutherford Guys, MD as Consulting Physician (Ophthalmology) Randol Kern (Dentistry)  Indicate any recent Medical Services you may have received from other  than [facility] providers in the past year (date may be approximate).     Assessment:   This is a routine wellness examination for Renee Lyons.  Physical assessment deferred to PCP.   Hearing/Vision screen Hearing Screening Comments: No problem with conversational tones.  Vision Screening Comments: Last exam this year. Wears glasses.   Dietary issues and exercise activities discussed: Current Exercise Habits: The patient does not participate in regular exercise at present (Works in yard ), Exercise limited by: cardiac condition(s) Diet (meal preparation, eat out, water intake, caffeinated beverages, dairy products, fruits and vegetables): Prepares own meals, occasional eats out. No snacking. Drinks regular sodas and water.   Breakfast: Crackers, soup Lunch: Sandwich, left overs Dinner: meat,starch, vegetables  Encouraged to decrease soda intake and replace with water.    Goals    . Weight (lb) < 160 lb (72.6 kg)          Patient would like  to lose 20 pounds. Patient plans to join local gym and walking around neighborhood. Resistant bands while watching television. Decrease carb and sugary drinks.       Depression Screen PHQ 2/9 Scores 04/04/2016  PHQ - 2 Score 0    Fall Risk Fall Risk  04/04/2016  Falls in the past year? No    Cognitive Function:       Ad8 score reviewed for issues:  Issues making decisions:No.   Less interest in hobbies / activities:No.   Repeats questions, stories (family complaining): No.  Trouble using ordinary gadgets (microwave, computer, phone):No.  Forgets the month or year: No.  Mismanaging finances: No.  Remembering appts:No.   Daily problems with thinking and/or memory:No. Ad8 score is=0.     Screening Tests Health Maintenance  Topic Date Due  . Hepatitis C Screening  Dec 16, 1950  . DEXA SCAN  10/13/2013  . PNA vac Low Risk Adult (2 of 2 - PPSV23) 02/04/2015  . COLONOSCOPY  10/02/2015  . MAMMOGRAM  12/15/2015  . INFLUENZA VACCINE   01/15/2016  . TETANUS/TDAP  02/16/2022  . ZOSTAVAX  Completed      Plan:     Continue to eat heart healthy diet (full of fruits, vegetables, whole grains, lean protein, water--limit salt, fat, and sugar intake) and increase physical activity as tolerated.  Continue doing brain stimulating activities (puzzles, reading, adult coloring books, staying active) to keep memory sharp.   Bring a copy of your advanced directives to your next office visit.  Patient concerns: --Cardiologist request CXR at next CPE for h/o cardiac cath (no symptoms)   During the course of the visit, Ailyne was educated and counseled about the following appropriate screening and preventive services:   Vaccines to include Pneumoccal, Influenza, Hepatitis B, Td, Zostavax, HCV  Cardiovascular disease screening  Colorectal cancer screening  Bone density screening  Diabetes screening  Glaucoma screening  Mammography/PAP  Nutrition counseling  Smoking cessation counseling  Patient Instructions (the written plan) were given to the patient.    Gerilyn Nestle, RN   04/04/2016

## 2016-04-03 NOTE — Progress Notes (Signed)
Pre visit review using our clinic review tool, if applicable. No additional management support is needed unless otherwise documented below in the visit note. 

## 2016-04-04 ENCOUNTER — Ambulatory Visit: Payer: Medicare Other

## 2016-04-04 ENCOUNTER — Ambulatory Visit (INDEPENDENT_AMBULATORY_CARE_PROVIDER_SITE_OTHER): Payer: Medicare Other

## 2016-04-04 VITALS — BP 148/70 | HR 60 | Ht 64.0 in | Wt 181.0 lb

## 2016-04-04 DIAGNOSIS — Z23 Encounter for immunization: Secondary | ICD-10-CM | POA: Diagnosis not present

## 2016-04-04 DIAGNOSIS — Z0000 Encounter for general adult medical examination without abnormal findings: Secondary | ICD-10-CM | POA: Diagnosis not present

## 2016-04-04 LAB — VITAMIN D, 1,25 + 25-HYDROXY: Vitamin D 1, 25 (OH)2 Total: 44

## 2016-04-04 NOTE — Patient Instructions (Addendum)
Continue to eat heart healthy diet (full of fruits, vegetables, whole grains, lean protein, water--limit salt, fat, and sugar intake) and increase physical activity as tolerated.  Continue doing brain stimulating activities (puzzles, reading, adult coloring books, staying active) to keep memory sharp.   Bring a copy of your advanced directives to your next office visit.      Fat and Cholesterol Restricted Diet Getting too much fat and cholesterol in your diet may cause health problems. Following this diet helps keep your fat and cholesterol at normal levels. This can keep you from getting sick. WHAT TYPES OF FAT SHOULD I CHOOSE?  Choose monosaturated and polyunsaturated fats. These are found in foods such as olive oil, canola oil, flaxseeds, walnuts, almonds, and seeds.  Eat more omega-3 fats. Good choices include salmon, mackerel, sardines, tuna, flaxseed oil, and ground flaxseeds.  Limit saturated fats. These are in animal products such as meats, butter, and cream. They can also be in plant products such as palm oil, palm kernel oil, and coconut oil.   Avoid foods with partially hydrogenated oils in them. These contain trans fats. Examples of foods that have trans fats are stick margarine, some tub margarines, cookies, crackers, and other baked goods. WHAT GENERAL GUIDELINES DO I NEED TO FOLLOW?   Check food labels. Look for the words "trans fat" and "saturated fat."  When preparing a meal:  Fill half of your plate with vegetables and green salads.  Fill one fourth of your plate with whole grains. Look for the word "whole" as the first word in the ingredient list.  Fill one fourth of your plate with lean protein foods.  Limit fruit to two servings a day. Choose fruit instead of juice.  Eat more foods with soluble fiber. Examples of foods with this type of fiber are apples, broccoli, carrots, beans, peas, and barley. Try to get 20-30 g (grams) of fiber per day.  Eat more  home-cooked foods. Eat less at restaurants and buffets.  Limit or avoid alcohol.  Limit foods high in starch and sugar.  Limit fried foods.  Cook foods without frying them. Baking, boiling, grilling, and broiling are all great options.  Lose weight if you are overweight. Losing even a small amount of weight can help your overall health. It can also help prevent diseases such as diabetes and heart disease. WHAT FOODS CAN I EAT? Grains Whole grains, such as whole wheat or whole grain breads, crackers, cereals, and pasta. Unsweetened oatmeal, bulgur, barley, quinoa, or brown rice. Corn or whole wheat flour tortillas. Vegetables Fresh or frozen vegetables (raw, steamed, roasted, or grilled). Green salads. Fruits All fresh, canned (in natural juice), or frozen fruits. Meat and Other Protein Products Ground beef (85% or leaner), grass-fed beef, or beef trimmed of fat. Skinless chicken or [place]. Ground chicken or [place]. Pork trimmed of fat. All fish and seafood. Eggs. Dried beans, peas, or lentils. Unsalted nuts or seeds. Unsalted canned or dry beans. Dairy Low-fat dairy products, such as skim or 1% milk, 2% or reduced-fat cheeses, low-fat ricotta or cottage cheese, or plain low-fat yogurt. Fats and Oils Tub margarines without trans fats. Light or reduced-fat mayonnaise and salad dressings. Avocado. Olive, canola, sesame, or safflower oils. Natural peanut or almond butter (choose ones without added sugar and oil). The items listed above may not be a complete list of recommended foods or beverages. Contact your dietitian for more options. WHAT FOODS ARE NOT RECOMMENDED? Grains White bread. White pasta. White rice. Cornbread. Bagels, pastries,  and croissants. Crackers that contain trans fat. Vegetables White potatoes. Corn. Creamed or fried vegetables. Vegetables in a cheese sauce. Fruits Dried fruits. Canned fruit in light or heavy syrup. Fruit juice. Meat and Other Protein Products Fatty  cuts of meat. Ribs, chicken wings, bacon, sausage, bologna, salami, chitterlings, fatback, hot dogs, bratwurst, and packaged luncheon meats. Liver and organ meats. Dairy Whole or 2% milk, cream, half-and-half, and cream cheese. Whole milk cheeses. Whole-fat or sweetened yogurt. Full-fat cheeses. Nondairy creamers and whipped toppings. Processed cheese, cheese spreads, or cheese curds. Sweets and Desserts Corn syrup, sugars, honey, and molasses. Candy. Jam and jelly. Syrup. Sweetened cereals. Cookies, pies, cakes, donuts, muffins, and ice cream. Fats and Oils Butter, stick margarine, lard, shortening, ghee, or bacon fat. Coconut, palm kernel, or palm oils. Beverages Alcohol. Sweetened drinks (such as sodas, lemonade, and fruit drinks or punches). The items listed above may not be a complete list of foods and beverages to avoid. Contact your dietitian for more information.   This information is not intended to replace advice given to you by your health care provider. Make sure you discuss any questions you have with your health care provider.   Document Released: 12/02/2011 Document Revised: 06/23/2014 Document Reviewed: 09/01/2013 Elsevier Interactive Patient Education 2016 [place] in the Home  Falls can cause injuries. They can happen to people of all ages. There are many things you can do to make your home safe and to help prevent falls.  WHAT CAN I DO ON THE OUTSIDE OF MY HOME?  Regularly fix the edges of walkways and driveways and fix any cracks.  Remove anything that might make you trip as you walk through a door, such as a raised step or threshold.  Trim any bushes or trees on the path to your home.  Use bright outdoor lighting.  Clear any walking paths of anything that might make someone trip, such as rocks or tools.  Regularly check to see if handrails are loose or broken. Make sure that both sides of any steps have handrails.  Any raised decks and  porches should have guardrails on the edges.  Have any leaves, snow, or ice cleared regularly.  Use sand or salt on walking paths during winter.  Clean up any spills in your garage right away. This includes oil or grease spills. WHAT CAN I DO IN THE BATHROOM?   Use night lights.  Install grab bars by the toilet and in the tub and shower. Do not use towel bars as grab bars.  Use non-skid mats or decals in the tub or shower.  If you need to sit down in the shower, use a plastic, non-slip stool.  Keep the floor dry. Clean up any water that spills on the floor as soon as it happens.  Remove soap buildup in the tub or shower regularly.  Attach bath mats securely with double-sided non-slip rug tape.  Do not have throw rugs and other things on the floor that can make you trip. WHAT CAN I DO IN THE BEDROOM?  Use night lights.  Make sure that you have a light by your bed that is easy to reach.  Do not use any sheets or blankets that are too big for your bed. They should not hang down onto the floor.  Have a firm chair that has side arms. You can use this for support while you get dressed.  Do not have throw rugs and other things on the floor  that can make you trip. WHAT CAN I DO IN THE KITCHEN?  Clean up any spills right away.  Avoid walking on wet floors.  Keep items that you use a lot in easy-to-reach places.  If you need to reach something above you, use a strong step stool that has a grab bar.  Keep electrical cords out of the way.  Do not use floor polish or wax that makes floors slippery. If you must use wax, use non-skid floor wax.  Do not have throw rugs and other things on the floor that can make you trip. WHAT CAN I DO WITH MY STAIRS?  Do not leave any items on the stairs.  Make sure that there are handrails on both sides of the stairs and use them. Fix handrails that are broken or loose. Make sure that handrails are as long as the stairways.  Check any  carpeting to make sure that it is firmly attached to the stairs. Fix any carpet that is loose or worn.  Avoid having throw rugs at the top or bottom of the stairs. If you do have throw rugs, attach them to the floor with carpet tape.  Make sure that you have a light switch at the top of the stairs and the bottom of the stairs. If you do not have them, ask someone to add them for you. WHAT ELSE CAN I DO TO HELP PREVENT FALLS?  Wear shoes that:  Do not have high heels.  Have rubber bottoms.  Are comfortable and fit you well.  Are closed at the toe. Do not wear sandals.  If you use a stepladder:  Make sure that it is fully opened. Do not climb a closed stepladder.  Make sure that both sides of the stepladder are locked into place.  Ask someone to hold it for you, if possible.  Clearly mark and make sure that you can see:  Any grab bars or handrails.  First and last steps.  Where the edge of each step is.  Use tools that help you move around (mobility aids) if they are needed. These include:  Canes.  Walkers.  Scooters.  Crutches.  Turn on the lights when you go into a dark area. Replace any light bulbs as soon as they burn out.  Set up your furniture so you have a clear path. Avoid moving your furniture around.  If any of your floors are uneven, fix them.  If there are any pets around you, be aware of where they are.  Review your medicines with your doctor. Some medicines can make you feel dizzy. This can increase your chance of falling. Ask your doctor what other things that you can do to help prevent falls.   This information is not intended to replace advice given to you by your health care provider. Make sure you discuss any questions you have with your health care provider.   Document Released: 03/29/2009 Document Revised: 10/17/2014 Document Reviewed: 07/07/2014 Elsevier Interactive Patient Education 2016 [place] Maintenance,  Female Adopting a healthy lifestyle and getting preventive care can go a long way to promote health and wellness. Talk with your health care provider about what schedule of regular examinations is right for you. This is a good chance for you to check in with your provider about disease prevention and staying healthy. In between checkups, there are plenty of things you can do on your own. Experts have done a lot of research about which lifestyle changes  and preventive measures are most likely to keep you healthy. Ask your health care provider for more information. WEIGHT AND DIET  Eat a healthy diet  Be sure to include plenty of vegetables, fruits, low-fat dairy products, and lean protein.  Do not eat a lot of foods high in solid fats, added sugars, or salt.  Get regular exercise. This is one of the most important things you can do for your health.  Most adults should exercise for at least 150 minutes each week. The exercise should increase your heart rate and make you sweat (moderate-intensity exercise).  Most adults should also do strengthening exercises at least twice a week. This is in addition to the moderate-intensity exercise.  Maintain a healthy weight  Body mass index (BMI) is a measurement that can be used to identify possible weight problems. It estimates body fat based on height and weight. Your health care provider can help determine your BMI and help you achieve or maintain a healthy weight.  For females 55 years of age and older:   A BMI below 18.5 is considered underweight.  A BMI of 18.5 to 24.9 is normal.  A BMI of 25 to 29.9 is considered overweight.  A BMI of 30 and above is considered obese.  Watch levels of cholesterol and blood lipids  You should start having your blood tested for lipids and cholesterol at 67 years of age, then have this test every 5 years.  You may need to have your cholesterol levels checked more often if:  Your lipid or cholesterol levels  are high.  You are older than 67 years of age.  You are at high risk for heart disease.  CANCER SCREENING   Lung Cancer  Lung cancer screening is recommended for adults 62-16 years old who are at high risk for lung cancer because of a history of smoking.  A yearly low-dose CT scan of the lungs is recommended for people who:  Currently smoke.  Have quit within the past 15 years.  Have at least a 30-pack-year history of smoking. A pack year is smoking an average of one pack of cigarettes a day for 1 year.  Yearly screening should continue until it has been 15 years since you quit.  Yearly screening should stop if you develop a health problem that would prevent you from having lung cancer treatment.  Breast Cancer  Practice breast self-awareness. This means understanding how your breasts normally appear and feel.  It also means doing regular breast self-exams. Let your health care provider know about any changes, no matter how small.  If you are in your 20s or 30s, you should have a clinical breast exam (CBE) by a health care provider every 1-3 years as part of a regular health exam.  If you are 84 or older, have a CBE every year. Also consider having a breast X-ray (mammogram) every year.  If you have a family history of breast cancer, talk to your health care provider about genetic screening.  If you are at high risk for breast cancer, talk to your health care provider about having an MRI and a mammogram every year.  Breast cancer gene (BRCA) assessment is recommended for women who have family members with BRCA-related cancers. BRCA-related cancers include:  Breast.  Ovarian.  Tubal.  Peritoneal cancers.  Results of the assessment will determine the need for genetic counseling and BRCA1 and BRCA2 testing. Cervical Cancer Your health care provider may recommend that you be screened regularly  for cancer of the pelvic organs (ovaries, uterus, and vagina). This screening  involves a pelvic examination, including checking for microscopic changes to the surface of your cervix (Pap test). You may be encouraged to have this screening done every 3 years, beginning at age 32.  For women ages 24-65, health care providers may recommend pelvic exams and Pap testing every 3 years, or they may recommend the Pap and pelvic exam, combined with testing for human papilloma virus (HPV), every 5 years. Some types of HPV increase your risk of cervical cancer. Testing for HPV may also be done on women of any age with unclear Pap test results.  Other health care providers may not recommend any screening for nonpregnant women who are considered low risk for pelvic cancer and who do not have symptoms. Ask your health care provider if a screening pelvic exam is right for you.  If you have had past treatment for cervical cancer or a condition that could lead to cancer, you need Pap tests and screening for cancer for at least 20 years after your treatment. If Pap tests have been discontinued, your risk factors (such as having a new sexual partner) need to be reassessed to determine if screening should resume. Some women have medical problems that increase the chance of getting cervical cancer. In these cases, your health care provider may recommend more frequent screening and Pap tests. Colorectal Cancer  This type of cancer can be detected and often prevented.  Routine colorectal cancer screening usually begins at 67 years of age and continues through 67 years of age.  Your health care provider may recommend screening at an earlier age if you have risk factors for colon cancer.  Your health care provider may also recommend using home test kits to check for hidden blood in the stool.  A small camera at the end of a tube can be used to examine your colon directly (sigmoidoscopy or colonoscopy). This is done to check for the earliest forms of colorectal cancer.  Routine screening usually  begins at age 40.  Direct examination of the colon should be repeated every 5-10 years through 67 years of age. However, you may need to be screened more often if early forms of precancerous polyps or small growths are found. Skin Cancer  Check your skin from head to toe regularly.  Tell your health care provider about any new moles or changes in moles, especially if there is a change in a mole's shape or color.  Also tell your health care provider if you have a mole that is larger than the size of a pencil eraser.  Always use sunscreen. Apply sunscreen liberally and repeatedly throughout the day.  Protect yourself by wearing long sleeves, pants, a wide-brimmed hat, and sunglasses whenever you are outside. HEART DISEASE, DIABETES, AND HIGH BLOOD PRESSURE   High blood pressure causes heart disease and increases the risk of stroke. High blood pressure is more likely to develop in:  People who have blood pressure in the high end of the normal range (130-139/85-89 mm Hg).  People who are overweight or obese.  People who are African American.  If you are 23-34 years of age, have your blood pressure checked every 3-5 years. If you are 36 years of age or older, have your blood pressure checked every year. You should have your blood pressure measured twice--once when you are at a hospital or clinic, and once when you are not at a hospital or clinic. Record the  average of the two measurements. To check your blood pressure when you are not at a hospital or clinic, you can use:  An automated blood pressure machine at a pharmacy.  A home blood pressure monitor.  If you are between 4 years and 73 years old, ask your health care provider if you should take aspirin to prevent strokes.  Have regular diabetes screenings. This involves taking a blood sample to check your fasting blood sugar level.  If you are at a normal weight and have a low risk for diabetes, have this test once every three years  after 67 years of age.  If you are overweight and have a high risk for diabetes, consider being tested at a younger age or more often. PREVENTING INFECTION  Hepatitis B  If you have a higher risk for hepatitis B, you should be screened for this virus. You are considered at high risk for hepatitis B if:  You were born in a country where hepatitis B is common. Ask your health care provider which countries are considered high risk.  Your parents were born in a high-risk country, and you have not been immunized against hepatitis B (hepatitis B vaccine).  You have HIV or AIDS.  You use needles to inject street drugs.  You live with someone who has hepatitis B.  You have had sex with someone who has hepatitis B.  You get hemodialysis treatment.  You take certain medicines for conditions, including cancer, organ transplantation, and autoimmune conditions. Hepatitis C  Blood testing is recommended for:  Everyone born from [birth year] through 1967.  Anyone with known risk factors for hepatitis C. Sexually transmitted infections (STIs)  You should be screened for sexually transmitted infections (STIs) including gonorrhea and chlamydia if:  You are sexually active and are younger than 67 years of age.  You are older than 67 years of age and your health care provider tells you that you are at risk for this type of infection.  Your sexual activity has changed since you were last screened and you are at an increased risk for chlamydia or gonorrhea. Ask your health care provider if you are at risk.  If you do not have HIV, but are at risk, it may be recommended that you take a prescription medicine daily to prevent HIV infection. This is called pre-exposure prophylaxis (PrEP). You are considered at risk if:  You are sexually active and do not regularly use condoms or know the HIV status of your partner(s).  You take drugs by injection.  You are sexually active with a partner who has  HIV. Talk with your health care provider about whether you are at high risk of being infected with HIV. If you choose to begin PrEP, you should first be tested for HIV. You should then be tested every 3 months for as long as you are taking PrEP.  PREGNANCY   If you are premenopausal and you may become pregnant, ask your health care provider about preconception counseling.  If you may become pregnant, take 400 to 800 micrograms (mcg) of folic acid every day.  If you want to prevent pregnancy, talk to your health care provider about birth control (contraception). OSTEOPOROSIS AND MENOPAUSE   Osteoporosis is a disease in which the bones lose minerals and strength with aging. This can result in serious bone fractures. Your risk for osteoporosis can be identified using a bone density scan.  If you are 44 years of age or older, or if you  are at risk for osteoporosis and fractures, ask your health care provider if you should be screened.  Ask your health care provider whether you should take a calcium or vitamin D supplement to lower your risk for osteoporosis.  Menopause may have certain physical symptoms and risks.  Hormone replacement therapy may reduce some of these symptoms and risks. Talk to your health care provider about whether hormone replacement therapy is right for you.  HOME CARE INSTRUCTIONS   Schedule regular health, dental, and eye exams.  Stay current with your immunizations.   Do not use any tobacco products including cigarettes, chewing tobacco, or electronic cigarettes.  If you are pregnant, do not drink alcohol.  If you are breastfeeding, limit how much and how often you drink alcohol.  Limit alcohol intake to no more than 1 drink per day for nonpregnant women. One drink equals 12 ounces of beer, 5 ounces of wine, or 1 ounces of hard liquor.  Do not use street drugs.  Do not share needles.  Ask your health care provider for help if you need support or  information about quitting drugs.  Tell your health care provider if you often feel depressed.  Tell your health care provider if you have ever been abused or do not feel safe at home.   This information is not intended to replace advice given to you by your health care provider. Make sure you discuss any questions you have with your health care provider.   Document Released: 12/16/2010 Document Revised: 06/23/2014 Document Reviewed: 05/04/2013 Elsevier Interactive Patient Education [employer].

## 2016-04-04 NOTE — Assessment & Plan Note (Signed)
Discussed lipid panel result from 2015 along with ways of improving results (increase exercise and healthy eating). Patient not fasting, will return for fasting lipid panel and CPE.

## 2016-04-07 ENCOUNTER — Encounter: Payer: Self-pay | Admitting: Family Medicine

## 2016-04-07 NOTE — Progress Notes (Signed)
Reviewed Medicare Wellness encounter done by Sabas Sous, RN, today. I agree with her findings and plans.  Signed:  Crissie Sickles, MD           04/07/2016

## 2016-04-14 DIAGNOSIS — Z1212 Encounter for screening for malignant neoplasm of rectum: Secondary | ICD-10-CM | POA: Diagnosis not present

## 2016-04-14 DIAGNOSIS — Z1211 Encounter for screening for malignant neoplasm of colon: Secondary | ICD-10-CM | POA: Diagnosis not present

## 2016-04-18 LAB — COLOGUARD: COLOGUARD: NEGATIVE

## 2016-04-21 ENCOUNTER — Telehealth: Payer: Self-pay | Admitting: Family Medicine

## 2016-04-21 NOTE — Telephone Encounter (Signed)
Patients husband, Herbie [place] , notified of test results and verbalized understanding.

## 2016-04-21 NOTE — Telephone Encounter (Signed)
Pls notify pt that her Cologuard result came back NEGATIVE. This is good!  We'll repeat this test in 3 yrs.

## 2016-05-29 ENCOUNTER — Encounter: Payer: Self-pay | Admitting: Family Medicine

## 2016-05-29 ENCOUNTER — Ambulatory Visit (INDEPENDENT_AMBULATORY_CARE_PROVIDER_SITE_OTHER): Payer: Medicare Other | Admitting: Family Medicine

## 2016-05-29 VITALS — BP 126/66 | HR 55 | Temp 98.0°F | Resp 16 | Ht 63.5 in | Wt 182.0 lb

## 2016-05-29 DIAGNOSIS — R0609 Other forms of dyspnea: Secondary | ICD-10-CM | POA: Diagnosis not present

## 2016-05-29 NOTE — Patient Instructions (Signed)
Ask your husband if you stop breathing for 4-5 seconds at a time when you are sleeping.  Call and report this to my nurse if he says you do.

## 2016-05-29 NOTE — Progress Notes (Signed)
Pre visit review using our clinic review tool, if applicable. No additional management support is needed unless otherwise documented below in the visit note. 

## 2016-05-29 NOTE — Progress Notes (Signed)
OFFICE VISIT  05/29/2016   CC:  Chief Complaint  Patient presents with  . Shortness of Breath    occasionally, Dr. Angelena Form did a heart cath and recommended pt have a chest xray done   HPI:    Patient is a 65 y.o. Caucasian female who presents for "my heart doctor recommended I come to you to ask for a CXR).  This was apparently back in Feb this year when she was getting cardiac workup.  She has history of chest pain that has been worked up and found to be noncardiac.   For over a year now she consistently feels easily short of breath with fast walking or activity like yard work.  When she stops walking she returns to normal w/in 30 sec.  She no longer has any chest pain.   She has never had to take the nitroglycerine her cardiologist rx'd her.  No cough, no hemoptysis.  No wheezing.  No chest tightness.  No palpitations.  Has rare lower legs swelling that goes away with putting feet up briefly.  +Exposure to high radon levels at home she lived in for 30 yrs (unknown duration of high level). She has never smoked.  Occ wakes up out of sleep feeling like she needs to take several deep breaths.  She snores but says her husband has never commented on any apneic spells in her sleep.  Past Medical History:  Diagnosis Date  . Allergic rhinitis   . GERD (gastroesophageal reflux disease)   . Goiter    stable on [place] as per Dr Altheimer  . Hx of cardiovascular stress test 04/09/12   a. ETT + for ischemia;  b. ETT-MV: (2013) images with no ischemia, EF  77%; +ECG changes c/w ischemia  . Hyperlipidemia   . Hypertension   . IBS (irritable bowel syndrome)   . Osteoarthritis of both hands 2016   Both hands, wrists, R knee.  Rheum eval 11/2014 NEG (Dr. Trudie Reed)  . Seizure disorder [facility]) 1977    Dr Mervyn Skeeters, Neurology.One episode in 1977    Past Surgical History:  Procedure Laterality Date  . bladder tacking  1974  . CARDIAC CATHETERIZATION N/A 07/03/2015   No angiographic evidence of CAD, normal LV  filling pressure and systolic fxn, no pulm HTN.  Procedure: Right/Left Heart Cath and Coronary Angiography;  Surgeon: Burnell Blanks, MD;  Location: [place] [facility];  Service: Cardiovascular;  Laterality: N/A;  . COLONOSCOPY  2003 & 2007   Dr Sharlett Iles  . TRANSTHORACIC ECHOCARDIOGRAM  09/24/2015   Normal.  EF 60-65%.  Marland Kitchen uterine polypectomy  2008    Dr Nori Riis    Outpatient Medications Prior to Visit  Medication Sig Dispense Refill  . ALPRAZolam (XANAX) 0.5 MG tablet Take 0.5 mg by mouth daily as needed for anxiety.   5  . amLODipine (NORVASC) 10 MG tablet Take 1 tablet by mouth  daily 90 tablet 3  . Cholecalciferol (VITAMIN D) 2000 units CAPS Take 2,000 Units by mouth daily.    . fexofenadine (ALLEGRA) 180 MG tablet Take 180 mg by mouth daily.    . fluticasone (FLONASE) 50 MCG/ACT nasal spray SHAKE LIQUID AND USE 2 SPRAYS IN EACH NOSTRIL EVERY DAY 16 g 1  . Multiple Vitamin (MULTIVITAMIN) tablet Take 1 tablet by mouth daily.    . nitroGLYCERIN (NITROSTAT) 0.4 MG SL tablet Place 1 tablet (0.4 mg total) under the tongue every 5 (five) minutes as needed for chest pain. 25 tablet 6  No facility-administered medications prior to visit.     Allergies  Allergen Reactions  . Achromycin [Tetracycline] Hives  . Other Rash    "Mycins" - hives    ROS As per HPI  PE: Blood pressure 126/66, pulse (!) 55, temperature 98 F (36.7 C), temperature source Oral, resp. rate 16, height 5' 3.5" (1.613 m), weight 182 lb (82.6 kg), SpO2 99 %. Gen: Alert, well appearing.  Patient is oriented to person, place, time, and situation. AFFECT: pleasant, lucid thought and speech. [id number]: no injection, icteris, swelling, or exudate.  EOMI, PERRLA. Mouth: lips without lesion/swelling.  Oral mucosa pink and moist. Oropharynx without erythema, exudate, or swelling.  CV: RRR, no m/r/g.   LUNGS: CTA bilat, nonlabored resps, good aeration in all lung fields. EXT: no clubbing, cyanosis, or edema.     LABS:  Lab Results  Component Value Date   WBC 7.0 06/27/2015   HGB 11.9 (L) 06/27/2015   HCT 35.5 (L) 06/27/2015   MCV 83.9 06/27/2015   PLT 277 06/27/2015     Chemistry      Component Value Date/Time   NA 137 06/27/2015 1650   K 4.0 06/27/2015 1650   CL 103 06/27/2015 1650   CO2 25 06/27/2015 1650   BUN 23 06/27/2015 1650   CREATININE 0.90 06/27/2015 1650      Component Value Date/Time   CALCIUM 9.7 06/27/2015 1650   ALKPHOS 75 01/31/2014 0821   AST 24 01/31/2014 0821   ALT 16 01/31/2014 0821   BILITOT 0.4 01/31/2014 0821      IMPRESSION AND PLAN:  Chronic DOE; mild.  Patient feels like it may be due to her weight/body habitus. Her lung exam today is normal and she has no other respiratory symptoms. I will check a CXR.  No further w/u at this time if CXR normal.   (Also, pt will ask her husband about any apneic spells in her sleep when she gets home and will notify our office if he says yes). Again, full cardiac evaluation, including cath, was unrevealing.  An After Visit Summary was printed and given to the patient.  Spent 30 min with pt today, with >50% of this time spent in counseling and care coordination regarding the above problems.  FOLLOW UP: Return in about 4 months (around 09/27/2016) for annual CPE (fasting).  Signed:  Crissie Sickles, MD           05/29/2016

## 2016-06-02 ENCOUNTER — Ambulatory Visit (HOSPITAL_BASED_OUTPATIENT_CLINIC_OR_DEPARTMENT_OTHER)
Admission: RE | Admit: 2016-06-02 | Discharge: 2016-06-02 | Disposition: A | Discharge status: Home / Self Care | Payer: Medicare Other | Source: Ambulatory Visit | Attending: Family Medicine | Admitting: Family Medicine

## 2016-06-02 ENCOUNTER — Encounter: Payer: Self-pay | Admitting: Family Medicine

## 2016-06-02 DIAGNOSIS — J398 Other specified diseases of upper respiratory tract: Secondary | ICD-10-CM | POA: Diagnosis not present

## 2016-06-02 DIAGNOSIS — I700 Atherosclerosis of aorta: Secondary | ICD-10-CM | POA: Diagnosis not present

## 2016-06-02 DIAGNOSIS — R0602 Shortness of breath: Secondary | ICD-10-CM | POA: Diagnosis not present

## 2016-06-02 DIAGNOSIS — R0609 Other forms of dyspnea: Secondary | ICD-10-CM | POA: Insufficient documentation

## 2016-06-03 ENCOUNTER — Telehealth: Payer: Self-pay | Admitting: Family Medicine

## 2016-06-03 DIAGNOSIS — E042 Nontoxic multinodular goiter: Secondary | ICD-10-CM

## 2016-06-03 NOTE — Telephone Encounter (Signed)
Endo referral ordered as per pt request. 

## 2016-06-03 NOTE — Telephone Encounter (Signed)
Patient called to make appointment with Dr Altheimer. He is going to practice on [address] with the [facility]. They will need a referral.

## 2016-06-03 NOTE — Telephone Encounter (Signed)
Pt advised and voiced understanding.   

## 2016-08-27 ENCOUNTER — Telehealth: Payer: Self-pay | Admitting: Family Medicine

## 2016-08-27 NOTE — Telephone Encounter (Signed)
Patient called to see if Dr. Anitra Lauth could prepare a copy of her last X-ray that she had done. Patient needs X-ray for Dr. Legrand Como Altimer on [facility]. (phone) [phone] for her referral for nodule on thyroid.   Patient can come pick up copy of X-ray or Dr. Anitra Lauth can fax a copy to Dr. Altimer's office.   Please call patient to advise on what her next steps should be to get this copy.

## 2016-08-27 NOTE — Telephone Encounter (Signed)
Diane can you fax the requested information. Thanks.

## 2016-08-28 NOTE — Telephone Encounter (Signed)
Faxed & advised patient

## 2016-09-01 DIAGNOSIS — E042 Nontoxic multinodular goiter: Secondary | ICD-10-CM | POA: Diagnosis not present

## 2016-09-01 LAB — TSH: TSH: 2.24 u[IU]/mL (ref <=5.90)

## 2016-09-02 ENCOUNTER — Encounter: Payer: Self-pay | Admitting: Cardiovascular Disease

## 2016-09-08 ENCOUNTER — Encounter: Payer: Self-pay | Admitting: Family Medicine

## 2016-09-12 ENCOUNTER — Encounter: Payer: Self-pay | Admitting: Cardiovascular Disease

## 2016-09-12 ENCOUNTER — Ambulatory Visit (INDEPENDENT_AMBULATORY_CARE_PROVIDER_SITE_OTHER): Payer: Medicare Other | Admitting: Cardiovascular Disease

## 2016-09-12 VITALS — BP 130/82 | HR 53 | Ht 63.0 in | Wt 181.2 lb

## 2016-09-12 DIAGNOSIS — I10 Essential (primary) hypertension: Secondary | ICD-10-CM

## 2016-09-12 DIAGNOSIS — I340 Nonrheumatic mitral (valve) insufficiency: Secondary | ICD-10-CM

## 2016-09-12 NOTE — Progress Notes (Signed)
Chief Complaint  Patient presents with  . Follow-up    12 Month follow up    History of Present Illness: 66 yo female with history of IBS, HTN, HLD, and GERD here today for cardiac follow up. She was seen as a new patient in 2013 for evaluation of chest pain. She told me that she has had substernal burning with exercise with associated SOB for 2 years. This had become more frequent. Echo in 2013 with normal LV size and function, mild LVH, mild MR. Treadmill exercise stress test on 04/02/12 with ST depression, chest pain with exercise. Exercise stress myoview 04/08/12 with no evidence of ischemia. Elevated BP with exercise. It was felt that her chest pain may be related to her HTN. Beta blocker stopped due to bradycardia and Norvasc added. I saw her in January and she c/o dyspnea with minimal exertion and substernal chest pain. Cardiac cath 06/27/15 with no evidence of CAD, normal PA pressures. Echo April 2017 with normal LV size and function, no evidence of valvular disease.   She is here today for followup. No near syncope, syncope or LE edema.   No chest pain. She is now back to exercising. She has joined a gym.   Primary Care Physician: [name], MD   Past Medical History:  Diagnosis Date  . Allergic rhinitis   . GERD (gastroesophageal reflux disease)   . Hx of cardiovascular stress test 04/09/12   a. ETT + for ischemia;  b. ETT-MV: (2013) images with no ischemia, EF  77%; +ECG changes c/w ischemia  . Hyperlipidemia   . Hypertension   . IBS (irritable bowel syndrome)   . Multinodular goiter    stable on [place] as per Dr Altheimer (most recent u/s 2009).  F/u with Dr. Elyse Hsu 09/01/16 led to referral to thyroid surgeon at [facility] (Dr. Fredirick Maudlin) b/c of potential compressive symptoms.  . Osteoarthritis of both hands 2016   Both hands, wrists, R knee.  Rheum eval 11/2014 NEG (Dr. Trudie Reed)  . Seizure disorder [facility]) 1977    Dr Mervyn Skeeters, Neurology.One episode in 1977    Past Surgical  History:  Procedure Laterality Date  . bladder tacking  1974  . CARDIAC CATHETERIZATION N/A 07/03/2015   No angiographic evidence of CAD, normal LV filling pressure and systolic fxn, no pulm HTN.  Procedure: Right/Left Heart Cath and Coronary Angiography;  Surgeon: Burnell Blanks, MD;  Location: [place] [facility];  Service: Cardiovascular;  Laterality: N/A;  . COLONOSCOPY  2003 & 2007   Dr Sharlett Iles  . TRANSTHORACIC ECHOCARDIOGRAM  09/24/2015   Normal.  EF 60-65%.  Marland Kitchen uterine polypectomy  2008    Dr Nori Riis    Current Outpatient Prescriptions  Medication Sig Dispense Refill  . ALPRAZolam (XANAX) 0.5 MG tablet Take 0.5 mg by mouth daily as needed for anxiety.   5  . amLODipine (NORVASC) 10 MG tablet Take 1 tablet by mouth  daily 90 tablet 3  . Cholecalciferol (VITAMIN D) 2000 units CAPS Take 2,000 Units by mouth daily.    . fexofenadine (ALLEGRA) 180 MG tablet Take 180 mg by mouth daily.    . fluticasone (FLONASE) 50 MCG/ACT nasal spray SHAKE LIQUID AND USE 2 SPRAYS IN EACH NOSTRIL EVERY DAY 16 g 1  . Multiple Vitamin (MULTIVITAMIN) tablet Take 1 tablet by mouth daily.    . nitroGLYCERIN (NITROSTAT) 0.4 MG SL tablet Place 1 tablet (0.4 mg total) under the tongue every 5 (five) minutes as needed for chest pain.  25 tablet 6   No current facility-administered medications for this visit.     Allergies  Allergen Reactions  . Achromycin [Tetracycline] Hives  . Other Rash    "Mycins" - hives    Social History   Social History  . Marital status: Married    Spouse name: N/A  . Number of children: 0  . Years of education: N/A   Occupational History  . Retired, works part-time now for [employer] Retired   Social History Main Topics  . Smoking status: Never Smoker  . Smokeless tobacco: Never Used  . Alcohol use 0.5 oz/week    1 Standard drinks or equivalent per week     Comment: Rarely  . Drug use: No  . Sexual activity: Not on file   Other Topics Concern  . Not on file    Social History Narrative   Married, husband is pt of mine Ronae Noell).   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     No tobacco.   Alc: 1 drink per week.  No drugs.             Family History  Problem Relation Age of Onset  . Prostate cancer Father   . Heart attack Father 61  . Hypertension Mother   . Thalassemia Mother   . Diverticulitis Brother   . Suicidality Brother   . Thalassemia Maternal Aunt   . Stroke Neg Hx   . Diabetes Neg Hx     Review of Systems:  As stated in the HPI and otherwise negative.   BP 130/82   Pulse (!) 53   Ht 5\' 3"  (1.6 m)   Wt 181 lb 3.2 oz (82.2 kg)   SpO2 99%   BMI 32.10 kg/m   Physical Examination: General: Well developed, well nourished, NAD  HEENT: OP clear, mucus membranes moist  SKIN: warm, dry. No rashes. Neuro: No focal deficits  Musculoskeletal: Muscle strength 5/5 all ext  Psychiatric: Mood and affect normal  Neck: No JVD, no carotid bruits, no thyromegaly, no lymphadenopathy.  Lungs:Clear bilaterally, no wheezes, rhonci, crackles Cardiovascular: Regular rate and rhythm. No murmurs, gallops or rubs. Abdomen:Soft. Bowel sounds present. Non-tender.  Extremities: No lower extremity edema. Pulses are 2 + in the bilateral DP/PT.  Cardiac cath 07/03/15: 1. No angiographic evidence of CAD 2. Normal LV systolic function.  3. Normal PA pressures. 4. Normal LV filling pressures.   Echo April 2017: Left ventricle: The cavity size was normal. Wall thickness was   normal. Systolic function was normal. The estimated ejection   fraction was in the range of 60% to 65%. Wall motion was normal;   there were no regional wall motion abnormalities. - Left atrium: The atrium was mildly dilated.   EKG:  EKG is ordered today. The ekg ordered today demonstrates Sinus bradycardia, rate 53 bpm.   Recent Labs: 11/26/2015: TSH 2.14   Lipid Panel    Component Value Date/Time   CHOL 228 (H) 01/31/2014 0821   TRIG 126.0 01/31/2014  0821   HDL 46.20 01/31/2014 0821   CHOLHDL 5 01/31/2014 0821   VLDL 25.2 01/31/2014 0821   LDLCALC 157 (H) 01/31/2014 0821   LDLDIRECT 144.3 02/17/2012 1540     Wt Readings from Last 3 Encounters:  09/12/16 181 lb 3.2 oz (82.2 kg)  05/29/16 182 lb (82.6 kg)  04/04/16 181 lb (82.1 kg)     Other studies Reviewed: Additional studies/ records that were reviewed today include: . Review of the above  records demonstrates:    Assessment and Plan:   1. Chest pain: Non-cardiac. No evidence of CAD with recent cath.    2. HTN: Well controlled. Will continue Norvasc. Renal artery dopplers ok.  3. Mitral regurgitation: No evidence of mitral valve regurgitation on echo April 2017.    Current medicines are reviewed at length with the patient today.  The patient does not have concerns regarding medicines.  The following changes have been made:  no change  Labs/ tests ordered today include: right and left heart cath  No orders of the defined types were placed in this encounter.   Disposition:   FU with me in 12 months   Signed, Lauree Chandler, MD 09/12/2016 8:43 AM    [facility] [address], [place], [place]  [address] Phone: [phone]; Fax: [phone]

## 2016-09-12 NOTE — Patient Instructions (Signed)
Medication Instructions:  Your physician recommends that you continue on your current medications as directed. Please refer to the Current Medication list given to you today.   Labwork: none  Testing/Procedures: none  Follow-Up: Your physician wants you to follow-up in: 2 years.  You will receive a reminder letter in the mail two months in advance. If you don't receive a letter, please call our office to schedule the follow-up appointment.   Any Other Special Instructions Will Be Listed Below (If Applicable).     If you need a refill on your cardiac medications before your next appointment, please call your pharmacy.   

## 2016-09-18 DIAGNOSIS — E042 Nontoxic multinodular goiter: Secondary | ICD-10-CM | POA: Diagnosis not present

## 2016-09-18 DIAGNOSIS — J398 Other specified diseases of upper respiratory tract: Secondary | ICD-10-CM | POA: Diagnosis not present

## 2016-09-22 ENCOUNTER — Encounter: Payer: Self-pay | Admitting: Family Medicine

## 2016-09-23 ENCOUNTER — Encounter: Payer: Self-pay | Admitting: Family Medicine

## 2016-09-26 ENCOUNTER — Encounter: Payer: Medicare Other | Admitting: Family Medicine

## 2016-10-14 DIAGNOSIS — E890 Postprocedural hypothyroidism: Secondary | ICD-10-CM

## 2016-10-14 HISTORY — DX: Postprocedural hypothyroidism: E89.0

## 2016-10-14 HISTORY — PX: TOTAL THYROIDECTOMY: SHX2547

## 2016-10-17 ENCOUNTER — Other Ambulatory Visit: Payer: Self-pay | Admitting: Cardiovascular Disease

## 2016-10-23 ENCOUNTER — Encounter: Payer: Self-pay | Admitting: Family Medicine

## 2016-10-24 DIAGNOSIS — E042 Nontoxic multinodular goiter: Secondary | ICD-10-CM | POA: Diagnosis not present

## 2016-10-24 DIAGNOSIS — Z791 Long term (current) use of non-steroidal anti-inflammatories (NSAID): Secondary | ICD-10-CM | POA: Diagnosis not present

## 2016-10-24 DIAGNOSIS — Z79899 Other long term (current) drug therapy: Secondary | ICD-10-CM | POA: Diagnosis not present

## 2016-10-24 DIAGNOSIS — Z881 Allergy status to other antibiotic agents status: Secondary | ICD-10-CM | POA: Diagnosis not present

## 2016-10-24 DIAGNOSIS — I10 Essential (primary) hypertension: Secondary | ICD-10-CM | POA: Diagnosis not present

## 2016-10-24 DIAGNOSIS — Z85828 Personal history of other malignant neoplasm of skin: Secondary | ICD-10-CM | POA: Diagnosis not present

## 2016-10-29 DIAGNOSIS — Z85828 Personal history of other malignant neoplasm of skin: Secondary | ICD-10-CM | POA: Diagnosis not present

## 2016-10-29 DIAGNOSIS — Z881 Allergy status to other antibiotic agents status: Secondary | ICD-10-CM | POA: Diagnosis not present

## 2016-10-29 DIAGNOSIS — E048 Other specified nontoxic goiter: Secondary | ICD-10-CM | POA: Diagnosis not present

## 2016-10-29 DIAGNOSIS — I10 Essential (primary) hypertension: Secondary | ICD-10-CM | POA: Diagnosis not present

## 2016-10-29 DIAGNOSIS — E042 Nontoxic multinodular goiter: Secondary | ICD-10-CM | POA: Diagnosis not present

## 2016-10-29 DIAGNOSIS — Z79899 Other long term (current) drug therapy: Secondary | ICD-10-CM | POA: Diagnosis not present

## 2016-10-29 DIAGNOSIS — Z791 Long term (current) use of non-steroidal anti-inflammatories (NSAID): Secondary | ICD-10-CM | POA: Diagnosis not present

## 2016-10-30 DIAGNOSIS — I10 Essential (primary) hypertension: Secondary | ICD-10-CM | POA: Diagnosis not present

## 2016-10-30 DIAGNOSIS — E042 Nontoxic multinodular goiter: Secondary | ICD-10-CM | POA: Diagnosis not present

## 2016-10-30 DIAGNOSIS — Z881 Allergy status to other antibiotic agents status: Secondary | ICD-10-CM | POA: Diagnosis not present

## 2016-10-30 DIAGNOSIS — Z85828 Personal history of other malignant neoplasm of skin: Secondary | ICD-10-CM | POA: Diagnosis not present

## 2016-10-30 DIAGNOSIS — Z79899 Other long term (current) drug therapy: Secondary | ICD-10-CM | POA: Diagnosis not present

## 2016-10-30 DIAGNOSIS — Z791 Long term (current) use of non-steroidal anti-inflammatories (NSAID): Secondary | ICD-10-CM | POA: Diagnosis not present

## 2016-11-13 DIAGNOSIS — Z9089 Acquired absence of other organs: Secondary | ICD-10-CM | POA: Diagnosis not present

## 2016-11-13 DIAGNOSIS — E042 Nontoxic multinodular goiter: Secondary | ICD-10-CM | POA: Diagnosis not present

## 2016-11-24 ENCOUNTER — Encounter: Payer: Self-pay | Admitting: Family Medicine

## 2016-11-25 ENCOUNTER — Telehealth: Payer: Self-pay | Admitting: Family Medicine

## 2016-11-25 DIAGNOSIS — E559 Vitamin D deficiency, unspecified: Secondary | ICD-10-CM

## 2016-11-25 DIAGNOSIS — E890 Postprocedural hypothyroidism: Secondary | ICD-10-CM

## 2016-11-25 DIAGNOSIS — Z0000 Encounter for general adult medical examination without abnormal findings: Secondary | ICD-10-CM

## 2016-11-25 DIAGNOSIS — E7800 Pure hypercholesterolemia, unspecified: Secondary | ICD-10-CM

## 2016-11-25 NOTE — Telephone Encounter (Signed)
Patient requesting CPE labs to be drawn prior to CPE.  She wanted to me to remind DR McGowen that she had her thyroid removed.  Please advise.

## 2016-11-26 NOTE — Telephone Encounter (Signed)
Labs ordered.

## 2016-11-27 ENCOUNTER — Other Ambulatory Visit (INDEPENDENT_AMBULATORY_CARE_PROVIDER_SITE_OTHER): Payer: Medicare Other

## 2016-11-27 ENCOUNTER — Encounter: Payer: Self-pay | Admitting: Family Medicine

## 2016-11-27 DIAGNOSIS — Z0000 Encounter for general adult medical examination without abnormal findings: Secondary | ICD-10-CM | POA: Diagnosis not present

## 2016-11-27 DIAGNOSIS — E890 Postprocedural hypothyroidism: Secondary | ICD-10-CM | POA: Diagnosis not present

## 2016-11-27 DIAGNOSIS — E7800 Pure hypercholesterolemia, unspecified: Secondary | ICD-10-CM | POA: Diagnosis not present

## 2016-11-27 DIAGNOSIS — E559 Vitamin D deficiency, unspecified: Secondary | ICD-10-CM

## 2016-11-27 LAB — COMPREHENSIVE METABOLIC PANEL
ALBUMIN: 3.9 g/dL (ref 3.5–5.2)
ALK PHOS: 73 U/L (ref 39–117)
ALT: 14 U/L (ref 0–35)
AST: 24 U/L (ref 0–37)
BILIRUBIN TOTAL: 0.4 mg/dL (ref 0.2–1.2)
BUN: 18 mg/dL (ref 6–23)
CALCIUM: 9.2 mg/dL (ref 8.4–10.5)
CO2: 28 mEq/L (ref 19–32)
Chloride: 109 mEq/L (ref 96–112)
Creatinine, Ser: 0.83 mg/dL (ref 0.40–1.20)
GFR: 72.63 mL/min (ref >60.00)
Glucose, Bld: 89 mg/dL (ref 70–99)
POTASSIUM: 4.8 meq/L (ref 3.5–5.1)
Sodium: 141 mEq/L (ref 135–145)
TOTAL PROTEIN: 6.7 g/dL (ref 6.0–8.3)

## 2016-11-27 LAB — CBC WITH DIFFERENTIAL/PLATELET
BASOS PCT: 1.3 % (ref 0.0–3.0)
Basophils Absolute: 0.1 10*3/uL (ref 0.0–0.1)
EOS PCT: 1.7 % (ref 0.0–5.0)
Eosinophils Absolute: 0.1 10*3/uL (ref 0.0–0.7)
HEMATOCRIT: 34.6 % — AB (ref 36.0–46.0)
HEMOGLOBIN: 11.6 g/dL — AB (ref 12.0–15.0)
LYMPHS PCT: 33.7 % (ref 12.0–46.0)
Lymphs Abs: 1.6 10*3/uL (ref 0.7–4.0)
MCHC: 33.4 g/dL (ref 30.0–36.0)
MCV: 85.9 fl (ref 78.0–100.0)
MONO ABS: 0.4 10*3/uL (ref 0.1–1.0)
Monocytes Relative: 8.7 % (ref 3.0–12.0)
NEUTROS ABS: 2.6 10*3/uL (ref 1.4–7.7)
Neutrophils Relative %: 54.6 % (ref 43.0–77.0)
PLATELETS: 236 10*3/uL (ref 150.0–400.0)
RBC: 4.03 Mil/uL (ref 3.87–5.11)
RDW: 13.3 % (ref 11.5–15.5)
WBC: 4.8 10*3/uL (ref 4.0–10.5)

## 2016-11-27 LAB — TSH: TSH: 0.11 u[IU]/mL — ABNORMAL LOW (ref 0.35–4.50)

## 2016-11-27 LAB — LIPID PANEL
CHOL/HDL RATIO: 4
Cholesterol: 217 mg/dL — ABNORMAL HIGH (ref 0–200)
HDL: 50.7 mg/dL (ref >39.00)
LDL CALC: 150 mg/dL — AB (ref 0–99)
NONHDL: 166.16
Triglycerides: 83 mg/dL (ref 0.0–149.0)
VLDL: 16.6 mg/dL (ref 0.0–40.0)

## 2016-11-27 LAB — T4, FREE: FREE T4: 1.63 ng/dL — AB (ref 0.60–1.60)

## 2016-11-27 LAB — VITAMIN D 25 HYDROXY (VIT D DEFICIENCY, FRACTURES): VITD: 36.61 ng/mL (ref 30.00–100.00)

## 2016-11-28 LAB — T3: T3, Total: 118 ng/dL (ref 76–181)

## 2016-12-05 ENCOUNTER — Other Ambulatory Visit: Payer: Self-pay

## 2016-12-05 ENCOUNTER — Encounter: Payer: Self-pay | Admitting: Family Medicine

## 2016-12-05 ENCOUNTER — Ambulatory Visit (INDEPENDENT_AMBULATORY_CARE_PROVIDER_SITE_OTHER): Payer: Medicare Other | Admitting: Family Medicine

## 2016-12-05 VITALS — BP 124/72 | HR 57 | Temp 97.8°F | Resp 16 | Ht 62.0 in | Wt 175.0 lb

## 2016-12-05 DIAGNOSIS — Z0000 Encounter for general adult medical examination without abnormal findings: Secondary | ICD-10-CM | POA: Diagnosis not present

## 2016-12-05 DIAGNOSIS — E890 Postprocedural hypothyroidism: Secondary | ICD-10-CM

## 2016-12-05 DIAGNOSIS — E7800 Pure hypercholesterolemia, unspecified: Secondary | ICD-10-CM

## 2016-12-05 DIAGNOSIS — Z9889 Other specified postprocedural states: Secondary | ICD-10-CM

## 2016-12-05 NOTE — Patient Instructions (Signed)

## 2016-12-05 NOTE — Progress Notes (Signed)
Office Note 12/05/2016  CC:  Chief Complaint  Patient presents with  . Annual Exam    CPE    HPI:  Renee Lyons is a 66 y.o. White female who is here for annual health maintenance exam. [facility] [place]: plans on f/u with Dr. Nori Riis this fall.  She had a diarrhea BM x 1d three days ago.  Has had some blood on toilet tissue when she wipes since then. No pain with BMs.  Applying nystatin/triamcin ointment currently.   Past Medical History:  Diagnosis Date  . Allergic rhinitis   . GERD (gastroesophageal reflux disease)   . Hx of cardiovascular stress test 04/09/12   a. ETT + for ischemia;  b. ETT-MV: (2013) images with no ischemia, EF  77%; +ECG changes c/w ischemia  . Hyperlipidemia   . Hypertension   . Hypothyroidism, postsurgical 10/2016  . IBS (irritable bowel syndrome)   . Multinodular goiter    stable on [place] as per Dr Altheimer (most recent u/s 2009).  F/u with Dr. Elyse Hsu 09/01/16 led to referral to thyroid surgeon at [facility] (Dr. Fredirick Maudlin) b/c of potential compressive symptoms.  Dr. Celine Ahr recommended total thyroidectomy and this was done 10/2016--path benign.  . Osteoarthritis of both hands 2016   Both hands, wrists, R knee.  Rheum eval 11/2014 NEG (Dr. Trudie Reed)  . Seizure disorder [facility]) 1977    Dr Mervyn Skeeters, Neurology.One episode in 1977    Past Surgical History:  Procedure Laterality Date  . bladder tacking  1974  . CARDIAC CATHETERIZATION N/A 07/03/2015   No angiographic evidence of CAD, normal LV filling pressure and systolic fxn, no pulm HTN.  Procedure: Right/Left Heart Cath and Coronary Angiography;  Surgeon: Burnell Blanks, MD;  Location: [place] [facility];  Service: Cardiovascular;  Laterality: N/A;  . COLONOSCOPY  2003 & 2007   Dr Sharlett Iles  . TOTAL THYROIDECTOMY  10/2016   Path: benign multinodular goiter (Dr. Celine Ahr, Surg onc at [facility].  Marland Kitchen TRANSTHORACIC ECHOCARDIOGRAM  09/24/2015   Normal.  EF 60-65%.  Marland Kitchen uterine polypectomy  2008    Dr Nori Riis    Family History  Problem Relation Age of Onset  . Prostate cancer Father   . Heart attack Father 81  . Hypertension Mother   . Thalassemia Mother   . Diverticulitis Brother   . Suicidality Brother   . Thalassemia Maternal Aunt   . Stroke Neg Hx   . Diabetes Neg Hx     Social History   Social History  . Marital status: Married    Spouse name: N/A  . Number of children: 0  . Years of education: N/A   Occupational History  . Retired, works part-time now for [employer] Retired   Social History Main Topics  . Smoking status: Never Smoker  . Smokeless tobacco: Never Used  . Alcohol use 0.5 oz/week    1 Standard drinks or equivalent per week     Comment: Rarely  . Drug use: No  . Sexual activity: Not on file   Other Topics Concern  . Not on file   Social History Narrative   Married, husband is pt of mine Renee Lyons).   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     No tobacco.   Alc: 1 drink per week.  No drugs.             Outpatient Medications Prior to Visit  Medication Sig Dispense Refill  . ALPRAZolam (XANAX) 0.5 MG  tablet Take 0.5 mg by mouth daily as needed for anxiety.   5  . amLODipine (NORVASC) 10 MG tablet TAKE 1 TABLET BY MOUTH  DAILY 90 tablet 3  . Cholecalciferol (VITAMIN D) 2000 units CAPS Take 2,000 Units by mouth daily.    . fexofenadine (ALLEGRA) 180 MG tablet Take 180 mg by mouth daily.    . fluticasone (FLONASE) 50 MCG/ACT nasal spray SHAKE LIQUID AND USE 2 SPRAYS IN EACH NOSTRIL EVERY DAY 16 g 1  . Multiple Vitamin (MULTIVITAMIN) tablet Take 1 tablet by mouth daily.    . nitroGLYCERIN (NITROSTAT) 0.4 MG SL tablet Place 1 tablet (0.4 mg total) under the tongue every 5 (five) minutes as needed for chest pain. 25 tablet 6   No facility-administered medications prior to visit.     Allergies  Allergen Reactions  . Achromycin [Tetracycline] Hives  . Other Rash    "Mycins" - hives    ROS Review of Systems  Constitutional: Negative  for appetite change, chills, fatigue and fever.  HENT: Negative for congestion, dental problem, ear pain and sore throat.   Eyes: Negative for discharge, redness and visual disturbance.  Respiratory: Negative for cough, chest tightness, shortness of breath and wheezing.   Cardiovascular: Negative for chest pain, palpitations and leg swelling.  Gastrointestinal: Positive for diarrhea (see hpi). Negative for abdominal pain, blood in stool, nausea and vomiting.  Genitourinary: Negative for difficulty urinating, dysuria, flank pain, frequency, hematuria and urgency.  Musculoskeletal: Negative for arthralgias, back pain, joint swelling, myalgias and neck stiffness.  Skin: Negative for pallor and rash.  Neurological: Negative for dizziness, speech difficulty, weakness and headaches.  Hematological: Negative for adenopathy. Does not bruise/bleed easily.  Psychiatric/Behavioral: Negative for confusion and sleep disturbance. The patient is not nervous/anxious.     PE; Blood pressure 124/72, pulse (!) 57, temperature 97.8 F (36.6 C), temperature source Oral, resp. rate 16, height 5\' 2"  (1.575 m), weight 175 lb (79.4 kg), SpO2 100 %. Gen: Alert, well appearing.  Patient is oriented to person, place, time, and situation. AFFECT: pleasant, lucid thought and speech. ENT: Ears: EACs clear, normal epithelium.  TMs with good light reflex and landmarks bilaterally.  Eyes: no injection, icteris, swelling, or exudate.  EOMI, PERRLA. Nose: no drainage or turbinate edema/swelling.  No injection or focal lesion.  Mouth: lips without lesion/swelling.  Oral mucosa pink and moist.  Dentition intact and without obvious caries or gingival swelling.  Oropharynx without erythema, exudate, or swelling.  Neck: supple/nontender.  No LAD, mass, or TM.  Carotid pulses 2+ bilaterally, without bruits. CV: RRR, no m/r/g.   LUNGS: CTA bilat, nonlabored resps, good aeration in all lung fields. ABD: soft, NT, ND, BS normal.  No  hepatospenomegaly or mass. I can feel abd aortic pulsations. No bruits. EXT: no clubbing, cyanosis, or edema.  Musculoskeletal: no joint swelling, erythema, warmth, or tenderness.  ROM of all joints intact. Skin - no sores or suspicious lesions or rashes or color changes  Pertinent labs:  Lab Results  Component Value Date   TSH 0.11 (L) 11/27/2016   Lab Results  Component Value Date   WBC 4.8 11/27/2016   HGB 11.6 (L) 11/27/2016   HCT 34.6 (L) 11/27/2016   MCV 85.9 11/27/2016   PLT 236.0 11/27/2016   Lab Results  Component Value Date   CREATININE 0.83 11/27/2016   BUN 18 11/27/2016   NA 141 11/27/2016   K 4.8 11/27/2016   CL 109 11/27/2016   CO2 28 11/27/2016  Lab Results  Component Value Date   ALT 14 11/27/2016   AST 24 11/27/2016   ALKPHOS 73 11/27/2016   BILITOT 0.4 11/27/2016   Lab Results  Component Value Date   CHOL 217 (H) 11/27/2016   Lab Results  Component Value Date   HDL 50.70 11/27/2016   Lab Results  Component Value Date   LDLCALC 150 (H) 11/27/2016   Lab Results  Component Value Date   TRIG 83.0 11/27/2016   Lab Results  Component Value Date   CHOLHDL 4 11/27/2016    ASSESSMENT AND PLAN:   1) Hyperlipidemia: pt chooses to decline statin at this time, will do TLC/wt loss and recheck FLP in 6 mo.  2) Hypoth: postsurgical: recent TSH a bit low---changed dosing to 125 mcg qd 6 days a week instead of 7. Recheck TSH planned about 6 wks from now. Of note, this surgery took care of her breathing and dysphagia symptoms!  3) Health maintenance exam: Reviewed age and gender appropriate health maintenance issues (prudent diet, regular exercise, health risks of tobacco and excessive alcohol, use of seatbelts, fire alarms in home, use of sunscreen).  Also reviewed age and gender appropriate health screening as well as vaccine recommendations. Vaccines: Td UTD.  Shingrix discussed today--rx given for pt to take to pharmacy today. Cerv ca screening:  most recent pap was 11/26/15 and was normal. Breast ca screening: most recent mammo was 10/2015--she gets this via her GYN. Colon ca screening: last colonoscopy was 2007 by Dr. Sharlett Iles. She had a cologuard that was neg in 04/2016.  Plan on repeat cologuard 04/2019.  An After Visit Summary was printed and given to the patient.  FOLLOW UP:  Return in about 6 months (around 06/06/2017) for f/u hypercholesterolemia (fasting).  Signed:  Crissie Sickles, MD           12/05/2016

## 2017-01-07 ENCOUNTER — Other Ambulatory Visit (INDEPENDENT_AMBULATORY_CARE_PROVIDER_SITE_OTHER): Payer: Medicare Other

## 2017-01-07 DIAGNOSIS — Z9889 Other specified postprocedural states: Secondary | ICD-10-CM

## 2017-01-07 DIAGNOSIS — E890 Postprocedural hypothyroidism: Secondary | ICD-10-CM

## 2017-01-07 LAB — TSH: TSH: 0.06 u[IU]/mL — ABNORMAL LOW (ref 0.35–4.50)

## 2017-01-08 ENCOUNTER — Encounter: Payer: Self-pay | Admitting: *Deleted

## 2017-01-08 ENCOUNTER — Other Ambulatory Visit: Payer: Self-pay | Admitting: *Deleted

## 2017-01-08 DIAGNOSIS — E890 Postprocedural hypothyroidism: Secondary | ICD-10-CM | POA: Insufficient documentation

## 2017-01-12 ENCOUNTER — Other Ambulatory Visit: Payer: Self-pay | Admitting: Family Medicine

## 2017-01-12 ENCOUNTER — Other Ambulatory Visit: Payer: Self-pay | Admitting: *Deleted

## 2017-01-12 DIAGNOSIS — E890 Postprocedural hypothyroidism: Secondary | ICD-10-CM

## 2017-01-12 MED ORDER — LEVOTHYROXINE SODIUM 112 MCG PO TABS: 112.0000 ug | ORAL_TABLET | Freq: Every day | ORAL | 2 refills | Status: DC

## 2017-01-26 ENCOUNTER — Other Ambulatory Visit: Payer: Self-pay | Admitting: *Deleted

## 2017-01-26 MED ORDER — FLUTICASONE PROPIONATE 50 MCG/ACT NA SUSP: NASAL | 3 refills | Status: DC

## 2017-01-26 NOTE — Telephone Encounter (Signed)
[facility] 

## 2017-03-10 ENCOUNTER — Other Ambulatory Visit (INDEPENDENT_AMBULATORY_CARE_PROVIDER_SITE_OTHER): Payer: Medicare Other

## 2017-03-10 DIAGNOSIS — E890 Postprocedural hypothyroidism: Secondary | ICD-10-CM

## 2017-03-10 LAB — TSH: TSH: 0.03 u[IU]/mL — AB (ref 0.35–4.50)

## 2017-03-11 ENCOUNTER — Other Ambulatory Visit: Payer: Self-pay | Admitting: Family Medicine

## 2017-03-11 ENCOUNTER — Other Ambulatory Visit: Payer: Self-pay | Admitting: *Deleted

## 2017-03-11 DIAGNOSIS — E890 Postprocedural hypothyroidism: Secondary | ICD-10-CM

## 2017-03-11 MED ORDER — LEVOTHYROXINE SODIUM 100 MCG PO TABS: 100.0000 ug | ORAL_TABLET | Freq: Every day | ORAL | 1 refills | Status: DC

## 2017-04-03 ENCOUNTER — Encounter: Payer: Self-pay | Admitting: Family Medicine

## 2017-04-03 NOTE — Progress Notes (Signed)
Subjective:   Renee Lyons is a 66 y.o. female who presents for Medicare Annual (Subsequent) preventive examination.  Works part time at [employer]).   Review of Systems:  No ROS.  Medicare Wellness Visit. Additional risk factors are reflected in the social history.  Cardiac Risk Factors include: dyslipidemia;hypertension;family history of premature cardiovascular disease;obesity (BMI >30kg/m2);advanced age (>24men, >32 women)   Sleep patterns: Sleeps 7 hours. Melatonin occasionally.  Home Safety/Smoke Alarms: Feels safe in home. Smoke alarms in place.  Living environment; residence and Firearm Safety: Lives with husband in 1 story home.  Seat Belt Safety/Bike Helmet: Wears seat belt.   Female:   Pap-2017       Mammo-11/05/2015, negative. Scheduled for 05/18/17.   Dexa scan-11/05/2015, Osteopenia.     CCS-Cologuard 04/18/2016, negative.      Objective:     Vitals: BP 130/68 (BP Location: Left Arm, Patient Position: Sitting, Cuff Size: Normal)   Pulse (!) 53   Ht 5\' 2"  (1.575 m)   Wt 175 lb 1.9 oz (79.4 kg)   SpO2 97%   BMI 32.03 kg/m   Body mass index is 32.03 kg/m.   Tobacco History  Smoking Status  . Never Smoker  Smokeless Tobacco  . Never Used     Counseling given: Not Answered   Past Medical History:  Diagnosis Date  . Allergic rhinitis   . GERD (gastroesophageal reflux disease)   . Hx of cardiovascular stress test 04/09/12   a. ETT + for ischemia;  b. ETT-MV: (2013) images with no ischemia, EF  77%; +ECG changes c/w ischemia  . Hyperlipidemia   . Hypertension   . Hypothyroidism, postsurgical 10/2016  . IBS (irritable bowel syndrome)   . Multinodular goiter    stable on [place] as per Dr Altheimer (most recent u/s 2009).  F/u with Dr. Elyse Hsu 09/01/16 led to referral to thyroid surgeon at [facility] (Dr. Fredirick Maudlin) b/c of potential compressive symptoms.  Dr. Celine Ahr recommended total thyroidectomy and this was done 10/2016--path benign.  .  Osteoarthritis of both hands 2016   Both hands, wrists, R knee.  Rheum eval 11/2014 NEG (Dr. Trudie Reed)  . Seizure disorder [facility]) 1977    Dr Mervyn Skeeters, Neurology.One episode in 1977   Past Surgical History:  Procedure Laterality Date  . bladder tacking  1974  . CARDIAC CATHETERIZATION N/A 07/03/2015   No angiographic evidence of CAD, normal LV filling pressure and systolic fxn, no pulm HTN.  Procedure: Right/Left Heart Cath and Coronary Angiography;  Surgeon: Burnell Blanks, MD;  Location: [facility];  Service: Cardiovascular;  Laterality: N/A;  . COLONOSCOPY  2003 & 2007   Dr Sharlett Iles  . TOTAL THYROIDECTOMY  10/2016   Path: benign multinodular goiter (Dr. Celine Ahr, Surg onc at [facility].  Marland Kitchen TRANSTHORACIC ECHOCARDIOGRAM  09/24/2015   Normal.  EF 60-65%.  Marland Kitchen uterine polypectomy  2008    Dr Nori Riis   Family History  Problem Relation Age of Onset  . Prostate cancer Father   . Heart attack Father 65  . Hypertension Mother   . Thalassemia Mother   . Diverticulitis Brother   . Suicidality Brother   . Thalassemia Maternal Aunt   . Stroke Neg Hx   . Diabetes Neg Hx    History  Sexual Activity  . Sexual activity: Not on file    Outpatient Encounter Prescriptions as of 04/06/2017  Medication Sig  . ALPRAZolam (XANAX) 0.5 MG tablet Take 0.5 mg by mouth daily as needed for  anxiety.   Marland Kitchen amLODipine (NORVASC) 10 MG tablet TAKE 1 TABLET BY MOUTH  DAILY  . Calcium-Magnesium-Vitamin D (CALCIUM 500 PO) Take 250 tablets by mouth.  . Cholecalciferol (VITAMIN D) 2000 units CAPS Take 2,000 Units by mouth daily.  . fexofenadine (ALLEGRA) 180 MG tablet Take 180 mg by mouth daily.  . fluticasone (FLONASE) 50 MCG/ACT nasal spray SHAKE LIQUID AND USE 2 SPRAYS IN EACH NOSTRIL EVERY DAY  . levothyroxine (SYNTHROID, LEVOTHROID) 100 MCG tablet Take 1 tablet (100 mcg total) by mouth daily.  . Multiple Vitamin (MULTIVITAMIN) tablet Take 1 tablet by mouth daily.  . nitroGLYCERIN (NITROSTAT) 0.4 MG SL tablet  Place 1 tablet (0.4 mg total) under the tongue every 5 (five) minutes as needed for chest pain. (Patient not taking: Reported on 04/06/2017)   No facility-administered encounter medications on file as of 04/06/2017.     Activities of Daily Living In your present state of health, do you have any difficulty performing the following activities: 04/06/2017  Hearing? N  Vision? N  Difficulty concentrating or making decisions? N  Walking or climbing stairs? N  Dressing or bathing? N  Doing errands, shopping? N  Preparing Food and eating ? N  Using the Toilet? N  In the past six months, have you accidently leaked urine? N  Do you have problems with loss of bowel control? N  Managing your Medications? N  Managing your Finances? N  Housekeeping or managing your Housekeeping? N  Some recent data might be hidden    Patient Care Team: [name], MD as PCP - General (Family Medicine) Gavin Pound, MD as Consulting Physician (Rheumatology) Burnell Blanks, MD as Consulting Physician (Cardiology) Maisie Fus, MD as Consulting Physician (Obstetrics and Gynecology) Rutherford Guys, MD as Consulting Physician (Ophthalmology) Randol Kern (Dentistry) Altheimer, Legrand [facility], MD as Consulting Physician (Endocrinology) Fredirick Maudlin, MD as Consulting Physician (General Surgery) Loney Loh, MD (Dermatology)    Assessment:    Physical assessment deferred to PCP.  Exercise Activities and Dietary recommendations Current Exercise Habits: Structured exercise class (Yard work), Type of exercise: walking;treadmill;strength training/weights, Time (Minutes): 30, Frequency (Times/Week): 3, Weekly Exercise (Minutes/Week): 90, Exercise limited by: None identified   Diet (meal preparation, eat out, water intake, caffeinated beverages, dairy products, fruits and vegetables): Drinks soda [facility]) and water.   Breakfast: cereal; eggs, bacon; grits; pancakes Lunch: snack (nuts, cheese,  crackers) Dinner: Protein and vegetables.   Goals    . Weight (lb) < 160 lb (72.6 kg)          Patient would like to lose 20 pounds. Patient plans to join local gym and walking around neighborhood. Resistant bands while watching television. Decrease carb and sugary drinks.     . Weight (lb) < 160 lb (72.6 kg)          Lose weight by continuing to stay active.       Fall Risk Fall Risk  04/06/2017 04/04/2016  Falls in the past year? No No   Depression Screen PHQ 2/9 Scores 04/06/2017 04/04/2016  PHQ - 2 Score 0 0     Cognitive Function       Ad8 score reviewed for issues:  Issues making decisions: no  Less interest in hobbies / activities: no  Repeats questions, stories (family complaining): no  Trouble using ordinary gadgets (microwave, computer, phone): no  Forgets the month or year: no  Mismanaging finances: no  Remembering appts: no  Daily problems with thinking and/or memory: no Ad8  score is=0     Immunization History  Administered Date(s) Administered  . Influenza, High Dose Seasonal PF 04/04/2016  . Influenza,inj,Quad PF,6+ Mos 03/25/2013, 03/22/2014  . Influenza-Unspecified 03/24/2015, 02/14/2017  . Pneumococcal Conjugate-13 02/03/2014  . Pneumococcal Polysaccharide-23 04/04/2016  . Tdap 02/17/2012  . Zoster 12/03/2011  . Zoster Recombinat (Shingrix) 03/16/2017   Screening Tests Health Maintenance  Topic Date Due  . Hepatitis C Screening  04/05/2019 (Originally March 23, 1951)  . MAMMOGRAM  11/04/2017  . COLONOSCOPY  04/19/2019  . TETANUS/TDAP  02/16/2022  . INFLUENZA VACCINE  Completed  . DEXA SCAN  Completed  . PNA vac Low Risk Adult  Completed      Plan:    Bring a copy of your living will and/or healthcare power of attorney to your next office visit.  Continue doing brain stimulating activities (puzzles, reading, adult coloring books, staying active) to keep memory sharp.   I have personally reviewed and noted the following in the  patient's chart:   . Medical and social history . Use of alcohol, tobacco or illicit drugs  . Current medications and supplements . Functional ability and status . Nutritional status . Physical activity . Advanced directives . List of other physicians . Hospitalizations, surgeries, and ER visits in previous 12 months . Vitals . Screenings to include cognitive, depression, and falls . Referrals and appointments  In addition, I have reviewed and discussed with patient certain preventive protocols, quality metrics, and best practice recommendations. A written personalized care plan for preventive services as well as general preventive health recommendations were provided to patient.     Gerilyn Nestle, RN  04/06/2017

## 2017-04-06 ENCOUNTER — Ambulatory Visit (INDEPENDENT_AMBULATORY_CARE_PROVIDER_SITE_OTHER): Payer: Medicare Other

## 2017-04-06 VITALS — BP 130/68 | HR 53 | Ht 62.0 in | Wt 175.1 lb

## 2017-04-06 DIAGNOSIS — Z0000 Encounter for general adult medical examination without abnormal findings: Secondary | ICD-10-CM | POA: Diagnosis not present

## 2017-04-06 NOTE — Patient Instructions (Addendum)
Bring a copy of your living will and/or healthcare power of attorney to your next office visit.  Continue doing brain stimulating activities (puzzles, reading, adult coloring books, staying active) to keep memory sharp.     Fall Prevention in the Home Falls can cause injuries. They can happen to people of all ages. There are many things you can do to make your home safe and to help prevent falls. What can I do on the outside of my home?  Regularly fix the edges of walkways and driveways and fix any cracks.  Remove anything that might make you trip as you walk through a door, such as a raised step or threshold.  Trim any bushes or trees on the path to your home.  Use bright outdoor lighting.  Clear any walking paths of anything that might make someone trip, such as rocks or tools.  Regularly check to see if handrails are loose or broken. Make sure that both sides of any steps have handrails.  Any raised decks and porches should have guardrails on the edges.  Have any leaves, snow, or ice cleared regularly.  Use sand or salt on walking paths during winter.  Clean up any spills in your garage right away. This includes oil or grease spills. What can I do in the bathroom?  Use night lights.  Install grab bars by the toilet and in the tub and shower. Do not use towel bars as grab bars.  Use non-skid mats or decals in the tub or shower.  If you need to sit down in the shower, use a plastic, non-slip stool.  Keep the floor dry. Clean up any water that spills on the floor as soon as it happens.  Remove soap buildup in the tub or shower regularly.  Attach bath mats securely with double-sided non-slip rug tape.  Do not have throw rugs and other things on the floor that can make you trip. What can I do in the bedroom?  Use night lights.  Make sure that you have a light by your bed that is easy to reach.  Do not use any sheets or blankets that are too big for your bed. They  should not hang down onto the floor.  Have a firm chair that has side arms. You can use this for support while you get dressed.  Do not have throw rugs and other things on the floor that can make you trip. What can I do in the kitchen?  Clean up any spills right away.  Avoid walking on wet floors.  Keep items that you use a lot in easy-to-reach places.  If you need to reach something above you, use a strong step stool that has a grab bar.  Keep electrical cords out of the way.  Do not use floor polish or wax that makes floors slippery. If you must use wax, use non-skid floor wax.  Do not have throw rugs and other things on the floor that can make you trip. What can I do with my stairs?  Do not leave any items on the stairs.  Make sure that there are handrails on both sides of the stairs and use them. Fix handrails that are broken or loose. Make sure that handrails are as long as the stairways.  Check any carpeting to make sure that it is firmly attached to the stairs. Fix any carpet that is loose or worn.  Avoid having throw rugs at the top or bottom of the stairs.  If you do have throw rugs, attach them to the floor with carpet tape.  Make sure that you have a light switch at the top of the stairs and the bottom of the stairs. If you do not have them, ask someone to add them for you. What else can I do to help prevent falls?  Wear shoes that: ? Do not have high heels. ? Have rubber bottoms. ? Are comfortable and fit you well. ? Are closed at the toe. Do not wear sandals.  If you use a stepladder: ? Make sure that it is fully opened. Do not climb a closed stepladder. ? Make sure that both sides of the stepladder are locked into place. ? Ask someone to hold it for you, if possible.  Clearly mark and make sure that you can see: ? Any grab bars or handrails. ? First and last steps. ? Where the edge of each step is.  Use tools that help you move around (mobility aids) if  they are needed. These include: ? Canes. ? Walkers. ? Scooters. ? Crutches.  Turn on the lights when you go into a dark area. Replace any light bulbs as soon as they burn out.  Set up your furniture so you have a clear path. Avoid moving your furniture around.  If any of your floors are uneven, fix them.  If there are any pets around you, be aware of where they are.  Review your medicines with your doctor. Some medicines can make you feel dizzy. This can increase your chance of falling. Ask your doctor what other things that you can do to help prevent falls. This information is not intended to replace advice given to you by your health care provider. Make sure you discuss any questions you have with your health care provider. Document Released: 03/29/2009 Document Revised: 11/08/2015 Document Reviewed: 07/07/2014 Elsevier Interactive Patient Education  2018 Renee Lyons Maintenance, Female Adopting a healthy lifestyle and getting preventive care can go a long way to promote health and wellness. Talk with your health care provider about what schedule of regular examinations is right for you. This is a good chance for you to check in with your provider about disease prevention and staying healthy. In between checkups, there are plenty of things you can do on your own. Experts have done a lot of research about which lifestyle changes and preventive measures are most likely to keep you healthy. Ask your health care provider for more information. Weight and diet Eat a healthy diet  Be sure to include plenty of vegetables, fruits, low-fat dairy products, and lean protein.  Do not eat a lot of foods high in solid fats, added sugars, or salt.  Get regular exercise. This is one of the most important things you can do for your health. ? Most adults should exercise for at least 150 minutes each week. The exercise should increase your heart rate and make you sweat (moderate-intensity  exercise). ? Most adults should also do strengthening exercises at least twice a week. This is in addition to the moderate-intensity exercise.  Maintain a healthy weight  Body mass index (BMI) is a measurement that can be used to identify possible weight problems. It estimates body fat based on height and weight. Your health care provider can help determine your BMI and help you achieve or maintain a healthy weight.  For females 31 years of age and older: ? A BMI below 18.5 is considered underweight. ? A BMI  of 18.5 to 24.9 is normal. ? A BMI of 25 to 29.9 is considered overweight. ? A BMI of 30 and above is considered obese.  Watch levels of cholesterol and blood lipids  You should start having your blood tested for lipids and cholesterol at 68 years of age, then have this test every 5 years.  You may need to have your cholesterol levels checked more often if: ? Your lipid or cholesterol levels are high. ? You are older than 68 years of age. ? You are at high risk for heart disease.  Cancer screening Lung Cancer  Lung cancer screening is recommended for adults 88-6 years old who are at high risk for lung cancer because of a history of smoking.  A yearly low-dose CT scan of the lungs is recommended for people who: ? Currently smoke. ? Have quit within the past 15 years. ? Have at least a 30-pack-year history of smoking. A pack year is smoking an average of one pack of cigarettes a day for 1 year.  Yearly screening should continue until it has been 15 years since you quit.  Yearly screening should stop if you develop a health problem that would prevent you from having lung cancer treatment.  Breast Cancer  Practice breast self-awareness. This means understanding how your breasts normally appear and feel.  It also means doing regular breast self-exams. Let your health care provider know about any changes, no matter how small.  If you are in your 20s or 30s, you should have a  clinical breast exam (CBE) by a health care provider every 1-3 years as part of a regular health exam.  If you are 75 or older, have a CBE every year. Also consider having a breast X-ray (mammogram) every year.  If you have a family history of breast cancer, talk to your health care provider about genetic screening.  If you are at high risk for breast cancer, talk to your health care provider about having an MRI and a mammogram every year.  Breast cancer gene (BRCA) assessment is recommended for women who have family members with BRCA-related cancers. BRCA-related cancers include: ? Breast. ? Ovarian. ? Tubal. ? Peritoneal cancers.  Results of the assessment will determine the need for genetic counseling and BRCA1 and BRCA2 testing.  Cervical Cancer Your health care provider may recommend that you be screened regularly for cancer of the pelvic organs (ovaries, uterus, and vagina). This screening involves a pelvic examination, including checking for microscopic changes to the surface of your cervix (Pap test). You may be encouraged to have this screening done every 3 years, beginning at age 34.  For women ages 31-65, health care providers may recommend pelvic exams and Pap testing every 3 years, or they may recommend the Pap and pelvic exam, combined with testing for human papilloma virus (HPV), every 5 years. Some types of HPV increase your risk of cervical cancer. Testing for HPV may also be done on women of any age with unclear Pap test results.  Other health care providers may not recommend any screening for nonpregnant women who are considered low risk for pelvic cancer and who do not have symptoms. Ask your health care provider if a screening pelvic exam is right for you.  If you have had past treatment for cervical cancer or a condition that could lead to cancer, you need Pap tests and screening for cancer for at least 20 years after your treatment. If Pap tests have been discontinued,  your risk factors (such as having a new sexual partner) need to be reassessed to determine if screening should resume. Some women have medical problems that increase the chance of getting cervical cancer. In these cases, your health care provider may recommend more frequent screening and Pap tests.  Colorectal Cancer  This type of cancer can be detected and often prevented.  Routine colorectal cancer screening usually begins at 68 years of age and continues through 68 years of age.  Your health care provider may recommend screening at an earlier age if you have risk factors for colon cancer.  Your health care provider may also recommend using home test kits to check for hidden blood in the stool.  A small camera at the end of a tube can be used to examine your colon directly (sigmoidoscopy or colonoscopy). This is done to check for the earliest forms of colorectal cancer.  Routine screening usually begins at age 42.  Direct examination of the colon should be repeated every 5-10 years through 68 years of age. However, you may need to be screened more often if early forms of precancerous polyps or small growths are found.  Skin Cancer  Check your skin from head to toe regularly.  Tell your health care provider about any new moles or changes in moles, especially if there is a change in a mole's shape or color.  Also tell your health care provider if you have a mole that is larger than the size of a pencil eraser.  Always use sunscreen. Apply sunscreen liberally and repeatedly throughout the day.  Protect yourself by wearing long sleeves, pants, a wide-brimmed hat, and sunglasses whenever you are outside.  Heart disease, diabetes, and high blood pressure  High blood pressure causes heart disease and increases the risk of stroke. High blood pressure is more likely to develop in: ? People who have blood pressure in the high end of the normal range (130-139/85-89 mm Hg). ? People who are  overweight or obese. ? People who are African American.  If you are 45-1 years of age, have your blood pressure checked every 3-5 years. If you are 15 years of age or older, have your blood pressure checked every year. You should have your blood pressure measured twice-once when you are at a hospital or clinic, and once when you are not at a hospital or clinic. Record the average of the two measurements. To check your blood pressure when you are not at a hospital or clinic, you can use: ? An automated blood pressure machine at a pharmacy. ? A home blood pressure monitor.  If you are between 76 years and 30 years old, ask your health care provider if you should take aspirin to prevent strokes.  Have regular diabetes screenings. This involves taking a blood sample to check your fasting blood sugar level. ? If you are at a normal weight and have a low risk for diabetes, have this test once every three years after 68 years of age. ? If you are overweight and have a high risk for diabetes, consider being tested at a younger age or more often. Preventing infection Hepatitis B  If you have a higher risk for hepatitis B, you should be screened for this virus. You are considered at high risk for hepatitis B if: ? You were born in a country where hepatitis B is common. Ask your health care provider which countries are considered high risk. ? Your parents were born in a high-risk  country, and you have not been immunized against hepatitis B (hepatitis B vaccine). ? You have HIV or AIDS. ? You use needles to inject street drugs. ? You live with someone who has hepatitis B. ? You have had sex with someone who has hepatitis B. ? You get hemodialysis treatment. ? You take certain medicines for conditions, including cancer, organ transplantation, and autoimmune conditions.  Hepatitis C  Blood testing is recommended for: ? Everyone born from [birth year] through 1967. ? Anyone with known risk factors for  hepatitis C.  Sexually transmitted infections (STIs)  You should be screened for sexually transmitted infections (STIs) including gonorrhea and chlamydia if: ? You are sexually active and are younger than 68 years of age. ? You are older than 68 years of age and your health care provider tells you that you are at risk for this type of infection. ? Your sexual activity has changed since you were last screened and you are at an increased risk for chlamydia or gonorrhea. Ask your health care provider if you are at risk.  If you do not have HIV, but are at risk, it may be recommended that you take a prescription medicine daily to prevent HIV infection. This is called pre-exposure prophylaxis (PrEP). You are considered at risk if: ? You are sexually active and do not regularly use condoms or know the HIV status of your partner(s). ? You take drugs by injection. ? You are sexually active with a partner who has HIV.  Talk with your health care provider about whether you are at high risk of being infected with HIV. If you choose to begin PrEP, you should first be tested for HIV. You should then be tested every 3 months for as long as you are taking PrEP. Pregnancy  If you are premenopausal and you may become pregnant, ask your health care provider about preconception counseling.  If you may become pregnant, take 400 to 800 micrograms (mcg) of folic acid every day.  If you want to prevent pregnancy, talk to your health care provider about birth control (contraception). Osteoporosis and menopause  Osteoporosis is a disease in which the bones lose minerals and strength with aging. This can result in serious bone fractures. Your risk for osteoporosis can be identified using a bone density scan.  If you are 62 years of age or older, or if you are at risk for osteoporosis and fractures, ask your health care provider if you should be screened.  Ask your health care provider whether you should take a  calcium or vitamin D supplement to lower your risk for osteoporosis.  Menopause may have certain physical symptoms and risks.  Hormone replacement therapy may reduce some of these symptoms and risks. Talk to your health care provider about whether hormone replacement therapy is right for you. Follow these instructions at home:  Schedule regular health, dental, and eye exams.  Stay current with your immunizations.  Do not use any tobacco products including cigarettes, chewing tobacco, or electronic cigarettes.  If you are pregnant, do not drink alcohol.  If you are breastfeeding, limit how much and how often you drink alcohol.  Limit alcohol intake to no more than 1 drink per day for nonpregnant women. One drink equals 12 ounces of beer, 5 ounces of wine, or 1 ounces of hard liquor.  Do not use street drugs.  Do not share needles.  Ask your health care provider for help if you need support or information about quitting drugs.  Tell your health care provider if you often feel depressed.  Tell your health care provider if you have ever been abused or do not feel safe at home. This information is not intended to replace advice given to you by your health care provider. Make sure you discuss any questions you have with your health care provider. Document Released: 12/16/2010 Document Revised: 11/08/2015 Document Reviewed: 03/06/2015 Elsevier Interactive Patient Education  [employer].

## 2017-04-07 NOTE — Progress Notes (Signed)
AWV reviewed and agree.  Signed:  Crissie Sickles, MD           04/07/2017

## 2017-04-20 ENCOUNTER — Other Ambulatory Visit: Payer: Self-pay | Admitting: *Deleted

## 2017-04-20 MED ORDER — FLUTICASONE PROPIONATE 50 MCG/ACT NA SUSP: NASAL | 3 refills | Status: DC

## 2017-04-30 DIAGNOSIS — L918 Other hypertrophic disorders of the skin: Secondary | ICD-10-CM | POA: Diagnosis not present

## 2017-04-30 DIAGNOSIS — Z85828 Personal history of other malignant neoplasm of skin: Secondary | ICD-10-CM | POA: Diagnosis not present

## 2017-04-30 DIAGNOSIS — L820 Inflamed seborrheic keratosis: Secondary | ICD-10-CM | POA: Diagnosis not present

## 2017-05-12 ENCOUNTER — Other Ambulatory Visit (INDEPENDENT_AMBULATORY_CARE_PROVIDER_SITE_OTHER): Payer: Medicare Other

## 2017-05-12 ENCOUNTER — Encounter: Payer: Self-pay | Admitting: Family Medicine

## 2017-05-12 ENCOUNTER — Other Ambulatory Visit: Payer: Self-pay | Admitting: Family Medicine

## 2017-05-12 DIAGNOSIS — E890 Postprocedural hypothyroidism: Secondary | ICD-10-CM

## 2017-05-12 LAB — TSH: TSH: 0.04 u[IU]/mL — ABNORMAL LOW (ref 0.35–4.50)

## 2017-05-12 MED ORDER — LEVOTHYROXINE SODIUM 75 MCG PO TABS: 75.0000 ug | ORAL_TABLET | Freq: Every day | ORAL | 1 refills | Status: DC

## 2017-05-13 ENCOUNTER — Encounter: Payer: Self-pay | Admitting: *Deleted

## 2017-05-18 DIAGNOSIS — Z1231 Encounter for screening mammogram for malignant neoplasm of breast: Secondary | ICD-10-CM | POA: Diagnosis not present

## 2017-05-18 DIAGNOSIS — Z01419 Encounter for gynecological examination (general) (routine) without abnormal findings: Secondary | ICD-10-CM | POA: Diagnosis not present

## 2017-05-19 LAB — HM MAMMOGRAPHY

## 2017-06-04 ENCOUNTER — Ambulatory Visit: Payer: Medicare Other | Admitting: Family Medicine

## 2017-07-10 ENCOUNTER — Other Ambulatory Visit: Payer: Self-pay | Admitting: Family Medicine

## 2017-07-16 NOTE — Telephone Encounter (Signed)
Pt has lab apt on 07/20/17.

## 2017-07-20 ENCOUNTER — Other Ambulatory Visit (INDEPENDENT_AMBULATORY_CARE_PROVIDER_SITE_OTHER): Payer: Medicare Other

## 2017-07-20 DIAGNOSIS — E890 Postprocedural hypothyroidism: Secondary | ICD-10-CM | POA: Diagnosis not present

## 2017-07-20 LAB — TSH: TSH: 2.34 u[IU]/mL (ref 0.35–4.50)

## 2017-07-21 ENCOUNTER — Encounter: Payer: Self-pay | Admitting: *Deleted

## 2017-07-21 DIAGNOSIS — H538 Other visual disturbances: Secondary | ICD-10-CM | POA: Diagnosis not present

## 2017-07-21 DIAGNOSIS — H2511 Age-related nuclear cataract, right eye: Secondary | ICD-10-CM | POA: Diagnosis not present

## 2017-07-21 DIAGNOSIS — H2512 Age-related nuclear cataract, left eye: Secondary | ICD-10-CM | POA: Diagnosis not present

## 2017-07-21 DIAGNOSIS — H5203 Hypermetropia, bilateral: Secondary | ICD-10-CM | POA: Diagnosis not present

## 2017-07-21 DIAGNOSIS — H52203 Unspecified astigmatism, bilateral: Secondary | ICD-10-CM | POA: Diagnosis not present

## 2017-08-10 ENCOUNTER — Other Ambulatory Visit: Payer: Self-pay | Admitting: Family Medicine

## 2017-08-12 DIAGNOSIS — H2511 Age-related nuclear cataract, right eye: Secondary | ICD-10-CM | POA: Diagnosis not present

## 2017-08-12 DIAGNOSIS — H2512 Age-related nuclear cataract, left eye: Secondary | ICD-10-CM | POA: Diagnosis not present

## 2017-08-12 DIAGNOSIS — H5703 Miosis: Secondary | ICD-10-CM | POA: Diagnosis not present

## 2017-08-12 DIAGNOSIS — H21562 Pupillary abnormality, left eye: Secondary | ICD-10-CM | POA: Diagnosis not present

## 2017-08-19 DIAGNOSIS — H2511 Age-related nuclear cataract, right eye: Secondary | ICD-10-CM | POA: Diagnosis not present

## 2017-08-27 DIAGNOSIS — E042 Nontoxic multinodular goiter: Secondary | ICD-10-CM | POA: Diagnosis not present

## 2017-08-31 DIAGNOSIS — E890 Postprocedural hypothyroidism: Secondary | ICD-10-CM | POA: Diagnosis not present

## 2017-09-07 ENCOUNTER — Encounter: Payer: Self-pay | Admitting: Family Medicine

## 2017-09-26 ENCOUNTER — Other Ambulatory Visit: Payer: Self-pay | Admitting: Cardiovascular Disease

## 2017-10-21 ENCOUNTER — Encounter: Payer: Self-pay | Admitting: *Deleted

## 2017-10-26 DIAGNOSIS — E890 Postprocedural hypothyroidism: Secondary | ICD-10-CM | POA: Diagnosis not present

## 2017-11-02 ENCOUNTER — Encounter: Payer: Self-pay | Admitting: Family Medicine

## 2017-11-30 ENCOUNTER — Encounter: Payer: Self-pay | Admitting: Family Medicine

## 2017-11-30 ENCOUNTER — Ambulatory Visit: Payer: Medicare Other | Admitting: Family Medicine

## 2017-11-30 VITALS — BP 107/77 | HR 55 | Temp 98.2°F | Resp 20 | Ht 62.0 in | Wt 181.2 lb

## 2017-11-30 DIAGNOSIS — J0100 Acute maxillary sinusitis, unspecified: Secondary | ICD-10-CM

## 2017-11-30 MED ORDER — AMOXICILLIN-POT CLAVULANATE 875-125 MG PO TABS: 1.0000 | ORAL_TABLET | Freq: Two times a day (BID) | ORAL | 0 refills | Status: DC

## 2017-11-30 NOTE — Patient Instructions (Signed)
Rest, hydrate.  + flonase, mucinex (DM if cough), nettie pot or nasal saline.  Daily Claritin , zyrtec or allegra Augmentin prescribed, take until completed.  If still noticing sensation in the morning after above treatment try over the counter omeprazole for GERD . If cough present it can last up to 6-8 weeks.  F/U 2 weeks of not improved.    Sinusitis, Adult Sinusitis is soreness and inflammation of your sinuses. Sinuses are hollow spaces in the bones around your face. They are located:  Around your eyes.  In the middle of your forehead.  Behind your nose.  In your cheekbones.  Your sinuses and nasal passages are lined with a stringy fluid (mucus). Mucus normally drains out of your sinuses. When your nasal tissues get inflamed or swollen, the mucus can get trapped or blocked so air cannot flow through your sinuses. This lets bacteria, viruses, and funguses grow, and that leads to infection. Follow these instructions at home: Medicines  Take, use, or apply over-the-counter and prescription medicines only as told by your doctor. These may include nasal sprays.  If you were prescribed an antibiotic medicine, take it as told by your doctor. Do not stop taking the antibiotic even if you start to feel better. Hydrate and Humidify  Drink enough water to keep your pee (urine) clear or pale yellow.  Use a cool mist humidifier to keep the humidity level in your home above 50%.  Breathe in steam for 10-15 minutes, 3-4 times a day or as told by your doctor. You can do this in the bathroom while a hot shower is running.  Try not to spend time in cool or dry air. Rest  Rest as much as possible.  Sleep with your head raised (elevated).  Make sure to get enough sleep each night. General instructions  Put a warm, moist washcloth on your face 3-4 times a day or as told by your doctor. This will help with discomfort.  Wash your hands often with soap and water. If there is no soap and  water, use hand sanitizer.  Do not smoke. Avoid being around people who are smoking (secondhand smoke).  Keep all follow-up visits as told by your doctor. This is important. Contact a doctor if:  You have a fever.  Your symptoms get worse.  Your symptoms do not get better within 10 days. Get help right away if:  You have a very bad headache.  You cannot stop throwing up (vomiting).  You have pain or swelling around your face or eyes.  You have trouble seeing.  You feel confused.  Your neck is stiff.  You have trouble breathing. This information is not intended to replace advice given to you by your health care provider. Make sure you discuss any questions you have with your health care provider. Document Released: 11/19/2007 Document Revised: 01/27/2016 Document Reviewed: 03/28/2015 Elsevier Interactive Patient Education  [employer].

## 2017-11-30 NOTE — Progress Notes (Signed)
Renee Lyons , January 09, 1951, 67 y.o., female MRN: [id number] Patient Care Team    Relationship Specialty Notifications Start End  McGowen, Adrian Blackwater, MD PCP - General Family Medicine  10/12/13    Comment: Dr. Linna Darner retirement (Merged)  Gavin Pound, MD Consulting Physician Rheumatology  01/02/15   Burnell Blanks, MD Consulting Physician Cardiology  04/04/16   Maisie Fus, MD Consulting Physician Obstetrics and Gynecology  04/04/16   Rutherford Guys, MD Consulting Physician Ophthalmology  04/04/16   Randol Kern  Dentistry  04/04/16   Altheimer, Legrand Como, MD Consulting Physician Endocrinology  09/08/16   Fredirick Maudlin, MD Consulting Physician General Surgery  11/24/16   Loney Loh, MD  Dermatology  04/06/17     Chief Complaint  Patient presents with  . URI    cough,congestion,drainage x 4 weeks     Subjective: Pt presents for an OV with complaints of cough of 4 weeks duration.  Associated symptoms include sinus pressure and nasal drainage. She reports the symptoms started after cleaning out attic, which there was a good deal of dust. She thought she had a cold, took OTC meds. She then though it was allergies and took claritin and flonase daily. She reports the phlegm production is worse in the morning.   Pt has tried mucinex DM yesterday, to ease their symptoms.  She denies fever, chills, nausea or vomit.   Depression screen [facility] 2/9 04/06/2017 04/04/2016  Decreased Interest 0 0  Down, Depressed, Hopeless 0 0  PHQ - 2 Score 0 0    Allergies  Allergen Reactions  . Achromycin [Tetracycline] Hives  . Other Rash    "Mycins" - hives   Social History   Tobacco Use  . Smoking status: Never Smoker  . Smokeless tobacco: Never Used  Substance Use Topics  . Alcohol use: Yes    Alcohol/week: 0.6 oz    Types: 1 Standard drinks or equivalent per week    Comment: Rarely   Past Medical History:  Diagnosis Date  . Allergic rhinitis   . GERD (gastroesophageal reflux  disease)   . Hx of cardiovascular stress test 04/09/12   a. ETT + for ischemia;  b. ETT-MV: (2013) images with no ischemia, EF  77%; +ECG changes c/w ischemia  . Hyperlipidemia   . Hypertension   . Hypothyroidism, postsurgical 10/2016  . IBS (irritable bowel syndrome)   . Multinodular goiter    stable on [place] as per Dr Altheimer (most recent u/s 2009).  F/u with Dr. Elyse Hsu 09/01/16 led to referral to thyroid surgeon at [facility] (Dr. Fredirick Maudlin) b/c of potential compressive symptoms.  Dr. Celine Ahr recommended total thyroidectomy and this was done 10/2016--path benign.  . Osteoarthritis of both hands 2016   Both hands, wrists, R knee.  Rheum eval 11/2014 NEG (Dr. Trudie Reed)  . Seizure disorder [facility]) 1977    Dr Mervyn Skeeters, Neurology.One episode in 1977   Past Surgical History:  Procedure Laterality Date  . bladder tacking  1974  . CARDIAC CATHETERIZATION N/A 07/03/2015   No angiographic evidence of CAD, normal LV filling pressure and systolic fxn, no pulm HTN.  Procedure: Right/Left Heart Cath and Coronary Angiography;  Surgeon: Burnell Blanks, MD;  Location: [place] [facility];  Service: Cardiovascular;  Laterality: N/A;  . COLONOSCOPY  2003 & 2007   Dr Sharlett Iles  . TOTAL THYROIDECTOMY  10/2016   Path: benign multinodular goiter (Dr. Celine Ahr, Surg onc at [facility]  Marland Kitchen TRANSTHORACIC ECHOCARDIOGRAM  09/24/2015   Normal.  EF 60-65%.  Marland Kitchen uterine polypectomy  2008    Dr Nori Riis   Family History  Problem Relation Age of Onset  . Prostate cancer Father   . Heart attack Father 56  . Hypertension Mother   . Thalassemia Mother   . Diverticulitis Brother   . Suicidality Brother   . Thalassemia Maternal Aunt   . Stroke Neg Hx   . Diabetes Neg Hx    Allergies as of 11/30/2017      Reactions   Achromycin [tetracycline] Hives   Other Rash   "Mycins" - hives      Medication List        Accurate as of 11/30/17  2:41 PM. Always use your most recent med list.          ALPRAZolam 0.5 MG  tablet Commonly known as:  XANAX Take 0.5 mg by mouth daily as needed for anxiety.   amLODipine 10 MG tablet Commonly known as:  NORVASC TAKE 1 TABLET BY MOUTH  DAILY   CALCIUM 500 PO Take 250 tablets by mouth.   fluticasone 50 MCG/ACT nasal spray Commonly known as:  FLONASE SHAKE LIQUID AND USE 2 SPRAYS IN EACH NOSTRIL EVERY DAY   multivitamin tablet Take 1 tablet by mouth daily.   nitroGLYCERIN 0.4 MG SL tablet Commonly known as:  NITROSTAT Place 1 tablet (0.4 mg total) under the tongue every 5 (five) minutes as needed for chest pain.   SYNTHROID 88 MCG tablet Generic drug:  levothyroxine Take 1 tablet by mouth daily.   Vitamin D 2000 units Caps Take 2,000 Units by mouth daily.       All past medical history, surgical history, allergies, family history, immunizations andmedications were updated in the EMR today and reviewed under the history and medication portions of their EMR.     ROS: Negative, with the exception of above mentioned in HPI   Objective:  BP 107/77 (BP Location: Left Arm, Patient Position: Sitting, Cuff Size: Large)   Pulse (!) 55   Temp 98.2 F (36.8 C)   Resp 20   Ht 5\' 2"  (1.575 m)   Wt 181 lb 4 oz (82.2 kg)   SpO2 97%   BMI 33.15 kg/m  Body mass index is 33.15 kg/m. Gen: Afebrile. No acute distress. Nontoxic in appearance, well developed, well nourished.  HENT: AT. [place]. Bilateral TM visualized without erythema or fullness. MMM, no oral lesions. Bilateral nares with moderate erythema, swelling and drainage. Throat without erythema or exudates. PND present. No cough. Mild TTP max sinus.  Eyes:Pupils Equal Round Reactive to light, Extraocular movements intact,  Conjunctiva without redness, discharge or icterus. Neck/lymp/endocrine: Supple,nolymphadenopathy CV: RRR  Chest: CTAB, no wheeze or crackles.   Abd: Soft.NTND. BS present Skin: no rashes, purpura or petechiae.  Neuro:  Normal gait. PERLA. EOMi. Alert. Oriented x3  No exam data  present No results found. No results found for this or any previous visit (from the past 24 hour(s)).  Assessment/Plan: Renee Lyons is a 67 y.o. female present for OV for  Acute non-recurrent maxillary sinusitis Rest, hydrate.  + flonase, mucinex (DM if cough), nettie pot or nasal saline.  Daily Claritin, zyretc  - or allegra Augmentin prescribed, take until completed.  Id sensation remains after tx consider low dose PPI. However she did have signs of sinus infection today.  If cough present it can last up to 6-8 weeks.  F/U 2 weeks of not improved.    Reviewed expectations re: course of  current medical issues.  Discussed self-management of symptoms.  Outlined signs and symptoms indicating need for more acute intervention.  Patient verbalized understanding and all questions were answered.  Patient received an After-Visit Summary.    No orders of the defined types were placed in this encounter.    Note is dictated utilizing voice recognition software. Although note has been proof read prior to signing, occasional typographical errors still can be missed. If any questions arise, please do not hesitate to call for verification.   electronically signed by:  Howard Pouch, DO  [place]

## 2018-01-04 DIAGNOSIS — Z961 Presence of intraocular lens: Secondary | ICD-10-CM | POA: Diagnosis not present

## 2018-02-22 DIAGNOSIS — E890 Postprocedural hypothyroidism: Secondary | ICD-10-CM | POA: Diagnosis not present

## 2018-03-02 DIAGNOSIS — L821 Other seborrheic keratosis: Secondary | ICD-10-CM | POA: Diagnosis not present

## 2018-03-02 DIAGNOSIS — Z85828 Personal history of other malignant neoplasm of skin: Secondary | ICD-10-CM | POA: Diagnosis not present

## 2018-03-04 DIAGNOSIS — E890 Postprocedural hypothyroidism: Secondary | ICD-10-CM | POA: Diagnosis not present

## 2018-03-04 DIAGNOSIS — Z23 Encounter for immunization: Secondary | ICD-10-CM | POA: Diagnosis not present

## 2018-03-11 DIAGNOSIS — N8182 Incompetence or weakening of pubocervical tissue: Secondary | ICD-10-CM | POA: Diagnosis not present

## 2018-03-14 ENCOUNTER — Encounter: Payer: Self-pay | Admitting: Family Medicine

## 2018-03-29 DIAGNOSIS — M79641 Pain in right hand: Secondary | ICD-10-CM | POA: Diagnosis not present

## 2018-03-29 DIAGNOSIS — M79671 Pain in right foot: Secondary | ICD-10-CM | POA: Diagnosis not present

## 2018-03-29 DIAGNOSIS — M79642 Pain in left hand: Secondary | ICD-10-CM | POA: Diagnosis not present

## 2018-04-09 ENCOUNTER — Ambulatory Visit: Payer: Medicare Other

## 2018-04-22 NOTE — Progress Notes (Signed)
Subjective:   Renee Lyons is a 67 y.o. female who presents for Medicare Annual (Subsequent) preventive examination.  Review of Systems:  No ROS.  Medicare Wellness Visit. Additional risk factors are reflected in the social history.  Cardiac Risk Factors include: advanced age (>40men, >70 women);dyslipidemia;hypertension;sedentary lifestyle;obesity (BMI >30kg/m2);family history of premature cardiovascular disease   Sleep patterns: Sleeps 6-8 hours, interrupted.  Home Safety/Smoke Alarms: Feels safe in home. Smoke alarms in place.  Living environment; residence and Firearm Safety: Lives with husband in 1 story home. Two steps at door.  Seat Belt Safety/Bike Helmet: Wears seat belt.   Female:   Pap-2017       Mammo-05/19/2017, negative. Scheduled 05/2018     Dexa scan-11/05/2015, Osteopenia. Ordered Dr. Nori Riis, will call.        CCS-Cologuard 04/18/2016, negative.      Objective:     Vitals: BP 138/72 (BP Location: Left Arm, Patient Position: Sitting, Cuff Size: Normal)   Pulse (!) 50   Ht 5\' 2"  (1.575 m)   Wt 176 lb 4 oz (79.9 kg)   SpO2 98%   BMI 32.24 kg/m   Body mass index is 32.24 kg/m.  Advanced Directives 04/23/2018 04/06/2017 04/04/2016 03/31/2016 07/03/2015  Does Patient Have a Medical Advance Directive? Yes Yes Yes No Yes  Type of Advance Directive Living will;Healthcare Power of [place];Living will [place];Living will - [place];Living will  Does patient want to make changes to medical advance directive? - - No - Patient declined - -  Copy of [place] in Chart? No - copy requested No - copy requested No - copy requested - No - copy requested    Tobacco Social History   Tobacco Use  Smoking Status Never Smoker  Smokeless Tobacco Never Used     Counseling given: Not Answered    Past Medical History:  Diagnosis Date  . Allergic rhinitis   . GERD (gastroesophageal reflux  disease)   . Hx of cardiovascular stress test 04/09/12   a. ETT + for ischemia;  b. ETT-MV: (2013) images with no ischemia, EF  77%; +ECG changes c/w ischemia  . Hyperlipidemia   . Hypertension   . Hypothyroidism, postsurgical 10/2016   Dr. Elyse Hsu follows  . IBS (irritable bowel syndrome)   . Multinodular goiter    Total thyroidectomy 10/29/16 done for compressive sx's  . Osteoarthritis of both hands 2016   Both hands, wrists, R knee.  Rheum eval 11/2014 NEG (Dr. Trudie Reed)  . Seizure disorder [facility]) 1977    Dr Mervyn Skeeters, Neurology.One episode in 1977   Past Surgical History:  Procedure Laterality Date  . bladder tacking  1974  . CARDIAC CATHETERIZATION N/A 07/03/2015   No angiographic evidence of CAD, normal LV filling pressure and systolic fxn, no pulm HTN.  Procedure: Right/Left Heart Cath and Coronary Angiography;  Surgeon: Burnell Blanks, MD;  Location: [place] [facility];  Service: Cardiovascular;  Laterality: N/A;  . COLONOSCOPY  2003 & 2007   Dr Sharlett Iles  . TOTAL THYROIDECTOMY  10/2016   Path: benign multinodular goiter (Dr. Celine Ahr, Surg onc at [facility]  Marland Kitchen TRANSTHORACIC ECHOCARDIOGRAM  09/24/2015   Normal.  EF 60-65%.  Marland Kitchen uterine polypectomy  2008    Dr Nori Riis   Family History  Problem Relation Age of Onset  . Prostate cancer Father   . Heart attack Father 26  . Hypertension Mother   . Thalassemia Mother   .  Diverticulitis Brother   . Suicidality Brother   . Thalassemia Maternal Aunt   . Stroke Neg Hx   . Diabetes Neg Hx    Social History   Socioeconomic History  . Marital status: Married    Spouse name: Not on file  . Number of children: 0  . Years of education: Not on file  . Highest education level: Not on file  Occupational History  . Occupation: Retired, works part-time now for [employer]: RETIRED  Social Needs  . Financial resource strain: Not on file  . Food insecurity:    Worry: Not on file    Inability: Not on file  . Transportation needs:      Medical: Not on file    Non-medical: Not on file  Tobacco Use  . Smoking status: Never Smoker  . Smokeless tobacco: Never Used  Substance and Sexual Activity  . Alcohol use: Yes    Alcohol/week: 1.0 standard drinks    Types: 1 Standard drinks or equivalent per week    Comment: Rarely  . Drug use: No  . Sexual activity: Not on file  Lifestyle  . Physical activity:    Days per week: Not on file    Minutes per session: Not on file  . Stress: Not on file  Relationships  . Social connections:    Talks on phone: Not on file    Gets together: Not on file    Attends religious service: Not on file    Active member of club or organization: Not on file    Attends meetings of clubs or organizations: Not on file    Relationship status: Not on file  Other Topics Concern  . Not on file  Social History Narrative   Married, husband is pt of mine Atira Borello).   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     No tobacco.   Alc: 1 drink per week.  No drugs.          Outpatient Encounter Medications as of 04/23/2018  Medication Sig  . ALPRAZolam (XANAX) 0.5 MG tablet Take 0.5 mg by mouth daily as needed for anxiety.   Marland Kitchen amLODipine (NORVASC) 10 MG tablet TAKE 1 TABLET BY MOUTH  DAILY  . Calcium-Magnesium-Vitamin D (CALCIUM 500 PO) Take 250 tablets by mouth.  . Cholecalciferol (VITAMIN D) 2000 units CAPS Take 2,000 Units by mouth daily.  . fluticasone (FLONASE) 50 MCG/ACT nasal spray SHAKE LIQUID AND USE 2 SPRAYS IN EACH NOSTRIL EVERY DAY  . levothyroxine (SYNTHROID) 88 MCG tablet Take 1 tablet by mouth daily.  . meloxicam (MOBIC) 15 MG tablet TK 1 T PO WITH FOOD QD  . Multiple Vitamin (MULTIVITAMIN) tablet Take 1 tablet by mouth daily.  Marland Kitchen nystatin-triamcinolone ointment (MYCOLOG) APPLY TOPICALLY AS NEEDED FOR SKIN IRRITATION  . nitroGLYCERIN (NITROSTAT) 0.4 MG SL tablet Place 1 tablet (0.4 mg total) under the tongue every 5 (five) minutes as needed for chest pain. (Patient not taking:  Reported on 04/06/2017)  . [DISCONTINUED] amoxicillin-clavulanate (AUGMENTIN) 875-125 MG tablet Take 1 tablet by mouth 2 (two) times daily.   No facility-administered encounter medications on file as of 04/23/2018.     Activities of Daily Living In your present state of health, do you have any difficulty performing the following activities: 04/23/2018  Hearing? N  Vision? N  Difficulty concentrating or making decisions? N  Walking or climbing stairs? N  Dressing or bathing? N  Doing errands, shopping? N  Conservation officer, nature and  eating ? N  Using the Toilet? N  In the past six months, have you accidently leaked urine? N  Do you have problems with loss of bowel control? N  Managing your Medications? N  Managing your Finances? N  Housekeeping or managing your Housekeeping? N  Some recent data might be hidden    Patient Care Team: [name], MD as PCP - General (Family Medicine) Burnell Blanks, MD as Consulting Physician (Cardiology) Maisie Fus, MD as Consulting Physician (Obstetrics and Gynecology) Rutherford Guys, MD as Consulting Physician (Ophthalmology) Randol Kern (Dentistry) Altheimer, Legrand Como, MD as Consulting Physician (Endocrinology) Fredirick Maudlin, MD as Consulting Physician (General Surgery) Loney Loh, MD (Dermatology) Melrose Nakayama, MD as Consulting Physician (Orthopedic Surgery)    Assessment:   This is a routine wellness examination for [place].  Exercise Activities and Dietary recommendations Current Exercise Habits: The patient does not participate in regular exercise at present(Running errands; yard work), Exercise limited by: None identified   Diet (meal preparation, eat out, water intake, caffeinated beverages, dairy products, fruits and vegetables): Drinks water.   Breakfast: eggs, bacon Lunch: sandwich, soup Dinner: protein, veggies  Goals    . Patient Stated     Maintain current health.     . Weight (lb) < 160 lb (72.6 kg)      Patient would like to lose 20 pounds. Patient plans to join local gym and walking around neighborhood. Resistant bands while watching television. Decrease carb and sugary drinks.     . Weight (lb) < 160 lb (72.6 kg)     Lose weight by continuing to stay active.        Fall Risk Fall Risk  04/23/2018 04/06/2017 04/04/2016  Falls in the past year? 1 No No  Comment fell in trash can - -  Number falls in past yr: 0 - -  Injury with Fall? 0 - -     Depression Screen PHQ 2/9 Scores 04/23/2018 04/06/2017 04/04/2016  PHQ - 2 Score 0 0 0     Cognitive Function MMSE - Mini Mental State Exam 04/23/2018  Orientation to time 5  Orientation to Place 5  Registration 3  Attention/ Calculation 3  Recall 1  Language- name 2 objects 2  Language- repeat 1  Language- follow 3 step command 3  Language- read & follow direction 1  Write a sentence 1  Copy design 1  Total score 26        Immunization History  Administered Date(s) Administered  . Influenza, High Dose Seasonal PF 04/04/2016, 02/15/2017  . Influenza,inj,Quad PF,6+ Mos 03/25/2013, 03/22/2014  . Influenza-Unspecified 03/24/2015, 03/04/2018  . Pneumococcal Conjugate-13 02/03/2014  . Pneumococcal Polysaccharide-23 04/04/2016  . Tdap 02/17/2012  . Zoster 12/03/2011  . Zoster Recombinat (Shingrix) 03/16/2017, 07/05/2017     Screening Tests Health Maintenance  Topic Date Due  . Hepatitis C Screening  04/05/2019 (Originally [birth year])  . MAMMOGRAM  05/19/2018  . COLONOSCOPY  04/19/2019  . TETANUS/TDAP  02/16/2022  . INFLUENZA VACCINE  Completed  . DEXA SCAN  Completed  . PNA vac Low Risk Adult  Completed        Plan:      Schedule physical with Dr. Anitra Lauth  Call Dr. Nori Riis to add DEXA scan to mammo appt.   Bring a copy of your living will and/or healthcare power of attorney to your next office visit.  Continue doing brain stimulating activities (puzzles, reading, adult coloring books, staying active) to keep memory  sharp.  I have personally reviewed and noted the following in the patient's chart:   . Medical and social history . Use of alcohol, tobacco or illicit drugs  . Current medications and supplements . Functional ability and status . Nutritional status . Physical activity . Advanced directives . List of other physicians . Hospitalizations, surgeries, and ER visits in previous 12 months . Vitals . Screenings to include cognitive, depression, and falls . Referrals and appointments  In addition, I have reviewed and discussed with patient certain preventive protocols, quality metrics, and best practice recommendations. A written personalized care plan for preventive services as well as general preventive health recommendations were provided to patient.     Gerilyn Nestle, RN  04/23/2018  PCP Notes: -Advised to schedule CPE with PCP -Advised to contact GYN to have DEXA added to mammo next month.

## 2018-04-23 ENCOUNTER — Other Ambulatory Visit: Payer: Self-pay

## 2018-04-23 ENCOUNTER — Ambulatory Visit (INDEPENDENT_AMBULATORY_CARE_PROVIDER_SITE_OTHER): Payer: Medicare Other

## 2018-04-23 VITALS — BP 138/72 | HR 50 | Ht 62.0 in | Wt 176.2 lb

## 2018-04-23 DIAGNOSIS — Z0000 Encounter for general adult medical examination without abnormal findings: Secondary | ICD-10-CM

## 2018-04-23 DIAGNOSIS — E669 Obesity, unspecified: Secondary | ICD-10-CM | POA: Diagnosis not present

## 2018-04-23 NOTE — Patient Instructions (Addendum)
Schedule physical with Dr. Anitra Lauth  Call Dr. Nori Riis to add DEXA scan to mammo appt.   Bring a copy of your living will and/or healthcare power of attorney to your next office visit.  Continue doing brain stimulating activities (puzzles, reading, adult coloring books, staying active) to keep memory sharp.   Health Maintenance, Female Adopting a healthy lifestyle and getting preventive care can go a long way to promote health and wellness. Talk with your health care provider about what schedule of regular examinations is right for you. This is a good chance for you to check in with your provider about disease prevention and staying healthy. In between checkups, there are plenty of things you can do on your own. Experts have done a lot of research about which lifestyle changes and preventive measures are most likely to keep you healthy. Ask your health care provider for more information. Weight and diet Eat a healthy diet  Be sure to include plenty of vegetables, fruits, low-fat dairy products, and lean protein.  Do not eat a lot of foods high in solid fats, added sugars, or salt.  Get regular exercise. This is one of the most important things you can do for your health. ? Most adults should exercise for at least 150 minutes each week. The exercise should increase your heart rate and make you sweat (moderate-intensity exercise). ? Most adults should also do strengthening exercises at least twice a week. This is in addition to the moderate-intensity exercise.  Maintain a healthy weight  Body mass index (BMI) is a measurement that can be used to identify possible weight problems. It estimates body fat based on height and weight. Your health care provider can help determine your BMI and help you achieve or maintain a healthy weight.  For females 60 years of age and older: ? A BMI below 18.5 is considered underweight. ? A BMI of 18.5 to 24.9 is normal. ? A BMI of 25 to 29.9 is considered  overweight. ? A BMI of 30 and above is considered obese.  Watch levels of cholesterol and blood lipids  You should start having your blood tested for lipids and cholesterol at 69 years of age, then have this test every 5 years.  You may need to have your cholesterol levels checked more often if: ? Your lipid or cholesterol levels are high. ? You are older than 69 years of age. ? You are at high risk for heart disease.  Cancer screening Lung Cancer  Lung cancer screening is recommended for adults 14-46 years old who are at high risk for lung cancer because of a history of smoking.  A yearly low-dose CT scan of the lungs is recommended for people who: ? Currently smoke. ? Have quit within the past 15 years. ? Have at least a 30-pack-year history of smoking. A pack year is smoking an average of one pack of cigarettes a day for 1 year.  Yearly screening should continue until it has been 15 years since you quit.  Yearly screening should stop if you develop a health problem that would prevent you from having lung cancer treatment.  Breast Cancer  Practice breast self-awareness. This means understanding how your breasts normally appear and feel.  It also means doing regular breast self-exams. Let your health care provider know about any changes, no matter how small.  If you are in your 20s or 30s, you should have a clinical breast exam (CBE) by a health care provider every 1-3 years  as part of a regular health exam.  If you are 54 or older, have a CBE every year. Also consider having a breast X-ray (mammogram) every year.  If you have a family history of breast cancer, talk to your health care provider about genetic screening.  If you are at high risk for breast cancer, talk to your health care provider about having an MRI and a mammogram every year.  Breast cancer gene (BRCA) assessment is recommended for women who have family members with BRCA-related cancers. BRCA-related cancers  include: ? Breast. ? Ovarian. ? Tubal. ? Peritoneal cancers.  Results of the assessment will determine the need for genetic counseling and BRCA1 and BRCA2 testing.  Cervical Cancer Your health care provider may recommend that you be screened regularly for cancer of the pelvic organs (ovaries, uterus, and vagina). This screening involves a pelvic examination, including checking for microscopic changes to the surface of your cervix (Pap test). You may be encouraged to have this screening done every 3 years, beginning at age 69.  For women ages 40-65, health care providers may recommend pelvic exams and Pap testing every 3 years, or they may recommend the Pap and pelvic exam, combined with testing for human papilloma virus (HPV), every 5 years. Some types of HPV increase your risk of cervical cancer. Testing for HPV may also be done on women of any age with unclear Pap test results.  Other health care providers may not recommend any screening for nonpregnant women who are considered low risk for pelvic cancer and who do not have symptoms. Ask your health care provider if a screening pelvic exam is right for you.  If you have had past treatment for cervical cancer or a condition that could lead to cancer, you need Pap tests and screening for cancer for at least 20 years after your treatment. If Pap tests have been discontinued, your risk factors (such as having a new sexual partner) need to be reassessed to determine if screening should resume. Some women have medical problems that increase the chance of getting cervical cancer. In these cases, your health care provider may recommend more frequent screening and Pap tests.  Colorectal Cancer  This type of cancer can be detected and often prevented.  Routine colorectal cancer screening usually begins at 69 years of age and continues through 69 years of age.  Your health care provider may recommend screening at an earlier age if you have risk factors  for colon cancer.  Your health care provider may also recommend using home test kits to check for hidden blood in the stool.  A small camera at the end of a tube can be used to examine your colon directly (sigmoidoscopy or colonoscopy). This is done to check for the earliest forms of colorectal cancer.  Routine screening usually begins at age 60.  Direct examination of the colon should be repeated every 5-10 years through 70 years of age. However, you may need to be screened more often if early forms of precancerous polyps or small growths are found.  Skin Cancer  Check your skin from head to toe regularly.  Tell your health care provider about any new moles or changes in moles, especially if there is a change in a mole's shape or color.  Also tell your health care provider if you have a mole that is larger than the size of a pencil eraser.  Always use sunscreen. Apply sunscreen liberally and repeatedly throughout the day.  Protect yourself by  wearing long sleeves, pants, a wide-brimmed hat, and sunglasses whenever you are outside.  Heart disease, diabetes, and high blood pressure  High blood pressure causes heart disease and increases the risk of stroke. High blood pressure is more likely to develop in: ? People who have blood pressure in the high end of the normal range (130-139/85-89 mm Hg). ? People who are overweight or obese. ? People who are African American.  If you are 48-51 years of age, have your blood pressure checked every 3-5 years. If you are 17 years of age or older, have your blood pressure checked every year. You should have your blood pressure measured twice-once when you are at a hospital or clinic, and once when you are not at a hospital or clinic. Record the average of the two measurements. To check your blood pressure when you are not at a hospital or clinic, you can use: ? An automated blood pressure machine at a pharmacy. ? A home blood pressure monitor.  If  you are between 67 years and 58 years old, ask your health care provider if you should take aspirin to prevent strokes.  Have regular diabetes screenings. This involves taking a blood sample to check your fasting blood sugar level. ? If you are at a normal weight and have a low risk for diabetes, have this test once every three years after 69 years of age. ? If you are overweight and have a high risk for diabetes, consider being tested at a younger age or more often. Preventing infection Hepatitis B  If you have a higher risk for hepatitis B, you should be screened for this virus. You are considered at high risk for hepatitis B if: ? You were born in a country where hepatitis B is common. Ask your health care provider which countries are considered high risk. ? Your parents were born in a high-risk country, and you have not been immunized against hepatitis B (hepatitis B vaccine). ? You have HIV or AIDS. ? You use needles to inject street drugs. ? You live with someone who has hepatitis B. ? You have had sex with someone who has hepatitis B. ? You get hemodialysis treatment. ? You take certain medicines for conditions, including cancer, organ transplantation, and autoimmune conditions.  Hepatitis C  Blood testing is recommended for: ? Everyone born from [birth year] through 1967. ? Anyone with known risk factors for hepatitis C.  Sexually transmitted infections (STIs)  You should be screened for sexually transmitted infections (STIs) including gonorrhea and chlamydia if: ? You are sexually active and are younger than 69 years of age. ? You are older than 69 years of age and your health care provider tells you that you are at risk for this type of infection. ? Your sexual activity has changed since you were last screened and you are at an increased risk for chlamydia or gonorrhea. Ask your health care provider if you are at risk.  If you do not have HIV, but are at risk, it may be recommended  that you take a prescription medicine daily to prevent HIV infection. This is called pre-exposure prophylaxis (PrEP). You are considered at risk if: ? You are sexually active and do not regularly use condoms or know the HIV status of your partner(s). ? You take drugs by injection. ? You are sexually active with a partner who has HIV.  Talk with your health care provider about whether you are at high risk of being infected with  HIV. If you choose to begin PrEP, you should first be tested for HIV. You should then be tested every 3 months for as long as you are taking PrEP. Pregnancy  If you are premenopausal and you may become pregnant, ask your health care provider about preconception counseling.  If you may become pregnant, take 400 to 800 micrograms (mcg) of folic acid every day.  If you want to prevent pregnancy, talk to your health care provider about birth control (contraception). Osteoporosis and menopause  Osteoporosis is a disease in which the bones lose minerals and strength with aging. This can result in serious bone fractures. Your risk for osteoporosis can be identified using a bone density scan.  If you are 21 years of age or older, or if you are at risk for osteoporosis and fractures, ask your health care provider if you should be screened.  Ask your health care provider whether you should take a calcium or vitamin D supplement to lower your risk for osteoporosis.  Menopause may have certain physical symptoms and risks.  Hormone replacement therapy may reduce some of these symptoms and risks. Talk to your health care provider about whether hormone replacement therapy is right for you. Follow these instructions at home:  Schedule regular health, dental, and eye exams.  Stay current with your immunizations.  Do not use any tobacco products including cigarettes, chewing tobacco, or electronic cigarettes.  If you are pregnant, do not drink alcohol.  If you are  breastfeeding, limit how much and how often you drink alcohol.  Limit alcohol intake to no more than 1 drink per day for nonpregnant women. One drink equals 12 ounces of beer, 5 ounces of wine, or 1 ounces of hard liquor.  Do not use street drugs.  Do not share needles.  Ask your health care provider for help if you need support or information about quitting drugs.  Tell your health care provider if you often feel depressed.  Tell your health care provider if you have ever been abused or do not feel safe at home. This information is not intended to replace advice given to you by your health care provider. Make sure you discuss any questions you have with your health care provider. Document Released: 12/16/2010 Document Revised: 11/08/2015 Document Reviewed: 03/06/2015 Elsevier Interactive Patient Education  [employer].

## 2018-04-25 NOTE — [facility] Notes (Signed)
AWV reviewed and agree. Signed:  Crissie Sickles, MD           04/25/2018

## 2018-06-02 DIAGNOSIS — M79641 Pain in right hand: Secondary | ICD-10-CM | POA: Diagnosis not present

## 2018-07-21 DIAGNOSIS — Z1231 Encounter for screening mammogram for malignant neoplasm of breast: Secondary | ICD-10-CM | POA: Diagnosis not present

## 2018-07-21 DIAGNOSIS — Z124 Encounter for screening for malignant neoplasm of cervix: Secondary | ICD-10-CM | POA: Diagnosis not present

## 2018-07-22 LAB — HM MAMMOGRAPHY

## 2018-07-27 ENCOUNTER — Encounter: Payer: Self-pay | Admitting: *Deleted

## 2018-07-28 ENCOUNTER — Encounter: Payer: Self-pay | Admitting: Family Medicine

## 2018-07-28 ENCOUNTER — Ambulatory Visit (INDEPENDENT_AMBULATORY_CARE_PROVIDER_SITE_OTHER): Payer: Medicare Other | Admitting: Family Medicine

## 2018-07-28 VITALS — BP 123/69 | HR 49 | Temp 98.0°F | Resp 16 | Ht 62.0 in | Wt 177.5 lb

## 2018-07-28 DIAGNOSIS — Z0000 Encounter for general adult medical examination without abnormal findings: Secondary | ICD-10-CM | POA: Diagnosis not present

## 2018-07-28 DIAGNOSIS — E7800 Pure hypercholesterolemia, unspecified: Secondary | ICD-10-CM

## 2018-07-28 DIAGNOSIS — I10 Essential (primary) hypertension: Secondary | ICD-10-CM | POA: Diagnosis not present

## 2018-07-28 DIAGNOSIS — E890 Postprocedural hypothyroidism: Secondary | ICD-10-CM

## 2018-07-28 DIAGNOSIS — E669 Obesity, unspecified: Secondary | ICD-10-CM

## 2018-07-28 LAB — COMPREHENSIVE METABOLIC PANEL
ALK PHOS: 79 U/L (ref 39–117)
ALT: 15 U/L (ref 0–35)
AST: 21 U/L (ref 0–37)
Albumin: 4.1 g/dL (ref 3.5–5.2)
BILIRUBIN TOTAL: 0.5 mg/dL (ref 0.2–1.2)
BUN: 21 mg/dL (ref 6–23)
CALCIUM: 9.4 mg/dL (ref 8.4–10.5)
CO2: 27 meq/L (ref 19–32)
CREATININE: 0.94 mg/dL (ref 0.40–1.20)
Chloride: 105 mEq/L (ref 96–112)
GFR: 58.91 mL/min — AB (ref >60.00)
Glucose, Bld: 81 mg/dL (ref 70–99)
Potassium: 4.6 mEq/L (ref 3.5–5.1)
Sodium: 139 mEq/L (ref 135–145)
TOTAL PROTEIN: 6.9 g/dL (ref 6.0–8.3)

## 2018-07-28 LAB — LIPID PANEL
CHOL/HDL RATIO: 4
Cholesterol: 238 mg/dL — ABNORMAL HIGH (ref 0–200)
HDL: 53.6 mg/dL (ref >39.00)
LDL Cholesterol: 168 mg/dL — ABNORMAL HIGH (ref 0–99)
NonHDL: 184.73
Triglycerides: 85 mg/dL (ref 0.0–149.0)
VLDL: 17 mg/dL (ref 0.0–40.0)

## 2018-07-28 LAB — CBC WITH DIFFERENTIAL/PLATELET
Basophils Absolute: 0.1 10*3/uL (ref 0.0–0.1)
Basophils Relative: 1 % (ref 0.0–3.0)
EOS PCT: 1.6 % (ref 0.0–5.0)
Eosinophils Absolute: 0.1 10*3/uL (ref 0.0–0.7)
HCT: 37.1 % (ref 36.0–46.0)
Hemoglobin: 12.4 g/dL (ref 12.0–15.0)
LYMPHS ABS: 1.9 10*3/uL (ref 0.7–4.0)
Lymphocytes Relative: 31 % (ref 12.0–46.0)
MCHC: 33.4 g/dL (ref 30.0–36.0)
MCV: 86.1 fl (ref 78.0–100.0)
MONO ABS: 0.5 10*3/uL (ref 0.1–1.0)
MONOS PCT: 7.8 % (ref 3.0–12.0)
NEUTROS ABS: 3.5 10*3/uL (ref 1.4–7.7)
NEUTROS PCT: 58.6 % (ref 43.0–77.0)
PLATELETS: 243 10*3/uL (ref 150.0–400.0)
RBC: 4.3 Mil/uL (ref 3.87–5.11)
RDW: 13.3 % (ref 11.5–15.5)
WBC: 6 10*3/uL (ref 4.0–10.5)

## 2018-07-28 LAB — TSH: TSH: 0.29 u[IU]/mL — AB (ref 0.35–4.50)

## 2018-07-28 NOTE — Patient Instructions (Signed)

## 2018-07-28 NOTE — [facility] Notes (Signed)
Office Note 07/28/2018  CC:  Chief Complaint  Patient presents with  . Annual Exam    Pt is fasting.    HPI:  Renee Lyons is a 68 y.o. White female who is here for annual health maintenance exam. [facility] [place]: Dr. Nori Lyons.  Exercise: senior yoga 2d/week in [place]. Does outside work at home as well. Diet: she admits her diet needs improvement and knows what to work on.   Past Medical History:  Diagnosis Date  . Allergic rhinitis   . GERD (gastroesophageal reflux disease)   . Hx of cardiovascular stress test 04/09/12   a. ETT + for ischemia;  b. ETT-MV: (2013) images with no ischemia, EF  77%; +ECG changes c/w ischemia  . Hyperlipidemia   . Hypertension   . Hypothyroidism, postsurgical 10/2016   Dr. Elyse Lyons follows  . IBS (irritable bowel syndrome)   . Multinodular goiter    Total thyroidectomy 10/29/16 done for compressive sx's  . Osteoarthritis of both hands 2016   Both hands, wrists, R knee.  Rheum eval 11/2014 [facility] (Dr. Trudie Lyons)  . Seizure disorder [facility]) 1977    Dr Renee Lyons, Neurology.One episode in 1977    Past Surgical History:  Procedure Laterality Date  . bladder tacking  1974  . CARDIAC CATHETERIZATION N/A 07/03/2015   No angiographic evidence of CAD, normal LV filling pressure and systolic fxn, no pulm HTN.  Procedure: Right/Left Heart Cath and Coronary Angiography;  Surgeon: Renee Blanks, MD;  Location: [facility];  Service: Cardiovascular;  Laterality: N/A;  . COLONOSCOPY  2003 & 2007   Dr Renee Lyons  . TOTAL THYROIDECTOMY  10/2016   Path: benign multinodular goiter (Dr. Celine [facility]ons, Surg onc at [facility].  Marland Kitchen TRANSTHORACIC ECHOCARDIOGRAM  09/24/2015   Normal.  EF 60-65%.  Marland Kitchen uterine polypectomy  2008    Dr Renee Lyons    Family History  Problem Relation Age of Onset  . Prostate cancer Father   . Heart attack Father 72  . Hypertension Mother   . Thalassemia Mother   . Diverticulitis Brother   . Suicidality Brother   . Thalassemia  Maternal Aunt   . Stroke Neg Hx   . Diabetes Neg Hx     Social History   Socioeconomic History  . Marital status: Married    Spouse name: Not on file  . Number of children: 0  . Years of education: Not on file  . Highest education level: Not on file  Occupational History  . Occupation: Retired, works part-time now for [employer]: RETIRED  Social Needs  . Financial resource strain: Not on file  . Food insecurity:    Worry: Not on file    Inability: Not on file  . Transportation needs:    Medical: Not on file    Non-medical: Not on file  Tobacco Use  . Smoking status: Never Smoker  . Smokeless tobacco: Never Used  Substance and Sexual Activity  . Alcohol use: Yes    Alcohol/week: 1.0 standard drinks    Types: 1 Standard drinks or equivalent per week    Comment: Rarely  . Drug use: No  . Sexual activity: Not on file  Lifestyle  . Physical activity:    Days per week: Not on file    Minutes per session: Not on file  . Stress: Not on file  Relationships  . Social connections:    Talks on phone: Not on file  Gets together: Not on file    Attends religious service: Not on file    Active member of club or organization: Not on file    Attends meetings of clubs or organizations: Not on file    Relationship status: Not on file  . Intimate partner violence:    Fear of current or ex partner: Not on file    Emotionally abused: Not on file    Physically abused: Not on file    Forced sexual activity: Not on file  Other Topics Concern  . Not on file  Social History Narrative   Married, husband is pt of mine Renee Lyons).   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     No tobacco.   Alc: 1 drink per week.  No drugs.          Outpatient Medications Prior to Visit  Medication Sig Dispense Refill  . ALPRAZolam (XANAX) 0.5 MG tablet Take 0.5 mg by mouth daily as needed for anxiety.   5  . amLODipine (NORVASC) 10 MG tablet TAKE 1 TABLET BY MOUTH  DAILY 90 tablet 2   . Calcium-Magnesium-Vitamin D (CALCIUM 500 PO) Take 250 tablets by mouth.    . Cholecalciferol (VITAMIN D) 2000 units CAPS Take 2,000 Units by mouth daily.    . fluticasone (FLONASE) 50 MCG/ACT nasal spray SHAKE LIQUID AND USE 2 SPRAYS IN EACH NOSTRIL EVERY DAY 48 g 3  . levothyroxine (SYNTHROID) 88 MCG tablet Take 1 tablet by mouth daily.    . meloxicam (MOBIC) 15 MG tablet TK 1 T PO WITH FOOD QD  2  . Multiple Vitamin (MULTIVITAMIN) tablet Take 1 tablet by mouth daily.    Marland Kitchen nystatin-triamcinolone ointment (MYCOLOG) APPLY TOPICALLY AS NEEDED FOR SKIN IRRITATION    . nitroGLYCERIN (NITROSTAT) 0.4 MG SL tablet Place 1 tablet (0.4 mg total) under the tongue every 5 (five) minutes as needed for chest pain. (Patient not taking: Reported on 04/06/2017) 25 tablet 6   No facility-administered medications prior to visit.     Allergies  Allergen Reactions  . Achromycin [Tetracycline] Hives  . Other Rash    "Mycins" - hives    ROS Review of Systems  Constitutional: Negative for appetite change, chills, fatigue and fever.  HENT: Negative for congestion, dental problem, ear pain and sore throat.   Eyes: Negative for discharge, redness and visual disturbance.  Respiratory: Negative for cough, chest tightness, shortness of breath and wheezing.   Cardiovascular: Negative for chest pain, palpitations and leg swelling.  Gastrointestinal: Negative for abdominal pain, blood in stool, diarrhea, nausea and vomiting.  Genitourinary: Negative for difficulty urinating, dysuria, flank pain, frequency, hematuria and urgency.  Musculoskeletal: Negative for arthralgias, back pain, joint swelling, myalgias and neck stiffness.  Skin: Negative for pallor and rash.  Neurological: Negative for dizziness, speech difficulty, weakness and headaches.  Hematological: Negative for adenopathy. Does not bruise/bleed easily.  Psychiatric/Behavioral: Negative for confusion and sleep disturbance. The patient is not  nervous/anxious.     PE; Blood pressure 123/69, pulse (!) 49, temperature 98 F (36.7 C), temperature source Oral, resp. rate 16, height 5\' 2"  (1.575 m), weight 177 lb 8 oz (80.5 kg), SpO2 98 %. Body mass index is 32.47 kg/m. Pt examined with Helayne Seminole, CMA, as chaperone.  Gen: Alert, well appearing.  Patient is oriented to person, place, time, and situation. AFFECT: pleasant, lucid thought and speech. ENT: Ears: EACs clear, normal epithelium.  TMs with good light reflex and landmarks bilaterally.  Eyes: no  injection, icteris, swelling, or exudate.  EOMI, PERRLA. Nose: no drainage or turbinate edema/swelling.  No injection or focal lesion.  Mouth: lips without lesion/swelling.  Oral mucosa pink and moist.  Dentition intact and without obvious caries or gingival swelling.  Oropharynx without erythema, exudate, or swelling.  Neck: supple/nontender.  No LAD, mass, or TM.  Carotid pulses 2+ bilaterally, without bruits. CV: RRR, no m/r/g.   LUNGS: CTA bilat, nonlabored resps, good aeration in all lung fields. ABD: soft, NT, ND, BS normal.  No hepatospenomegaly or mass.  No bruits. EXT: no clubbing, cyanosis, or edema.  Musculoskeletal: no joint swelling, erythema, warmth, or tenderness.  ROM of all joints intact. Skin - no sores or suspicious lesions or rashes or color changes  Pertinent labs:  Lab Results  Component Value Date   TSH 2.34 07/20/2017   Lab Results  Component Value Date   WBC 4.8 11/27/2016   HGB 11.6 (L) 11/27/2016   HCT 34.6 (L) 11/27/2016   MCV 85.9 11/27/2016   PLT 236.0 11/27/2016   Lab Results  Component Value Date   CREATININE 0.83 11/27/2016   BUN 18 11/27/2016   NA 141 11/27/2016   K 4.8 11/27/2016   CL 109 11/27/2016   CO2 28 11/27/2016   Lab Results  Component Value Date   ALT 14 11/27/2016   AST 24 11/27/2016   ALKPHOS 73 11/27/2016   BILITOT 0.4 11/27/2016   Lab Results  Component Value Date   CHOL 217 (H) 11/27/2016   Lab Results   Component Value Date   HDL 50.70 11/27/2016   Lab Results  Component Value Date   LDLCALC 150 (H) 11/27/2016   Lab Results  Component Value Date   TRIG 83.0 11/27/2016   Lab Results  Component Value Date   CHOLHDL 4 11/27/2016     ASSESSMENT AND PLAN:   Health maintenance exam: Reviewed age and gender appropriate health maintenance issues (prudent diet, regular exercise, health risks of tobacco and excessive alcohol, use of seatbelts, fire alarms in home, use of sunscreen).  Also reviewed age and gender appropriate health screening as well as vaccine recommendations. Vaccines: completely UTD. Labs: CMET, CBC, TSH, FLP. Cervical ca screening: Dr. [facility]--> Pap smear done 07/21/18-->all ok per pt. Colon ca screening: last colonoscopy was 2007 by Dr. Sharlett Lyons. She had a cologuard that was neg in 04/2016.  Plan on repeat cologuard after 04/2019. Breast ca screening: last mammogram 07/21/2018-->normal.  An After Visit Summary was printed and given to the patient.  FOLLOW UP:  Return in about 6 months (around 01/26/2019) for routine chronic illness f/u.  Signed:  Crissie Sickles, MD           07/28/2018

## 2018-07-29 ENCOUNTER — Other Ambulatory Visit: Payer: Self-pay

## 2018-07-29 MED ORDER — ATORVASTATIN CALCIUM 20 MG PO TABS: 20.0000 mg | ORAL_TABLET | Freq: Every day | ORAL | 2 refills | Status: DC

## 2018-08-11 DIAGNOSIS — R87618 Other abnormal cytological findings on specimens from cervix uteri: Secondary | ICD-10-CM | POA: Diagnosis not present

## 2018-08-23 DIAGNOSIS — E890 Postprocedural hypothyroidism: Secondary | ICD-10-CM | POA: Diagnosis not present

## 2018-09-22 ENCOUNTER — Other Ambulatory Visit: Payer: Self-pay | Admitting: Cardiovascular Disease

## 2018-09-27 ENCOUNTER — Other Ambulatory Visit (INDEPENDENT_AMBULATORY_CARE_PROVIDER_SITE_OTHER): Payer: Medicare Other

## 2018-09-27 ENCOUNTER — Other Ambulatory Visit: Payer: Self-pay

## 2018-09-27 ENCOUNTER — Encounter: Payer: Self-pay | Admitting: Family Medicine

## 2018-09-27 ENCOUNTER — Other Ambulatory Visit: Payer: Medicare Other

## 2018-09-27 DIAGNOSIS — E7800 Pure hypercholesterolemia, unspecified: Secondary | ICD-10-CM

## 2018-09-27 DIAGNOSIS — E890 Postprocedural hypothyroidism: Secondary | ICD-10-CM | POA: Diagnosis not present

## 2018-09-27 LAB — LIPID PANEL
Cholesterol: 143 mg/dL (ref 0–200)
HDL: 48.3 mg/dL (ref >39.00)
LDL Cholesterol: 75 mg/dL (ref 0–99)
NonHDL: 94.85
Total CHOL/HDL Ratio: 3
Triglycerides: 101 mg/dL (ref 0.0–149.0)
VLDL: 20.2 mg/dL (ref 0.0–40.0)

## 2018-09-27 LAB — TSH: TSH: 0.33 u[IU]/mL — ABNORMAL LOW (ref 0.35–4.50)

## 2018-09-28 ENCOUNTER — Telehealth: Payer: Self-pay

## 2018-09-28 NOTE — Telephone Encounter (Signed)
-----   Message from [name], MD sent at 09/27/2018  4:09 PM EDT ----- Thyroid normal. Cholesterol numbers GREATLY improved on the cholesterol medication. Continue current doses of all current meds.-thx

## 2018-10-05 ENCOUNTER — Telehealth: Payer: Self-pay | Admitting: Cardiovascular Disease

## 2018-10-05 NOTE — Telephone Encounter (Signed)
Virtual Visit Pre-Appointment Phone Call  "(Name), I am calling you today to discuss your upcoming appointment. We are currently trying to limit exposure to the virus that causes COVID-19 by seeing patients at home rather than in the office."  1. "What is the BEST phone number to call the day of the visit?" - include this in appointment notes  2. Do you have or have access to (through a family member/friend) a smartphone with video capability that we can use for your visit?" a. If yes - list this number in appt notes as cell (if different from BEST phone #) and list the appointment type as a VIDEO visit in appointment notes b. If no - list the appointment type as a PHONE visit in appointment notes  3. Confirm consent - "In the setting of the current Covid19 crisis, you are scheduled for a VIDEO visit with your provider on 4/22 at 1030am.  Just as we do with many in-office visits, in order for you to participate in this visit, we must obtain consent.  If you'd like, I can send this to your mychart (if signed up) or email for you to review.  Otherwise, I can obtain your verbal consent now.  All virtual visits are billed to your insurance company just like a normal visit would be.  By agreeing to a virtual visit, we'd like you to understand that the technology does not allow for your provider to perform an examination, and thus may limit your provider's ability to fully assess your condition. If your provider identifies any concerns that need to be evaluated in person, we will make arrangements to do so.  Finally, though the technology is pretty good, we cannot assure that it will always work on either your or our end, and in the setting of a video visit, we may have to convert it to a phone-only visit.  In either situation, we cannot ensure that we have a secure connection.  Are you willing to proceed?" STAFF: Did the patient verbally acknowledge consent to telehealth visit? Document YES/NO here:  YES  4. Advise patient to be prepared - "Two hours prior to your appointment, go ahead and check your blood pressure, pulse, oxygen saturation, and your weight (if you have the equipment to check those) and write them all down. When your visit starts, your provider will ask you for this information. If you have an Apple Watch or Kardia device, please plan to have heart rate information ready on the day of your appointment. Please have a pen and paper handy nearby the day of the visit as well."  5. Give patient instructions for MyChart download to smartphone OR [email or web address] as below if video visit (depending on what platform provider is using)  6. Inform patient they will receive a phone call 15 minutes prior to their appointment time (may be from unknown caller ID) so they should be prepared to answer    TELEPHONE CALL NOTE  Renee Lyons has been deemed a candidate for a follow-up tele-health visit to limit community exposure during the Covid-19 pandemic. I spoke with the patient via phone to ensure availability of phone/video source, confirm preferred email & phone number, and discuss instructions and expectations.  I reminded Renee Lyons to be prepared with any vital sign and/or heart rhythm information that could potentially be obtained via home monitoring, at the time of her visit. I reminded Renee Lyons to expect a phone call prior to her visit.  Renee Flavin, RN 10/05/2018 11:54 AM

## 2018-10-05 NOTE — Telephone Encounter (Signed)
New Message:   Patient concerning her appt.

## 2018-10-06 ENCOUNTER — Other Ambulatory Visit: Payer: Self-pay

## 2018-10-06 ENCOUNTER — Telehealth (INDEPENDENT_AMBULATORY_CARE_PROVIDER_SITE_OTHER): Payer: Medicare Other | Admitting: Cardiovascular Disease

## 2018-10-06 ENCOUNTER — Encounter: Payer: Self-pay | Admitting: Cardiovascular Disease

## 2018-10-06 VITALS — BP 139/61 | HR 56 | Ht 62.0 in | Wt 176.0 lb

## 2018-10-06 DIAGNOSIS — I10 Essential (primary) hypertension: Secondary | ICD-10-CM

## 2018-10-06 NOTE — Progress Notes (Signed)
Virtual Visit via Video Note   This visit type was conducted due to national recommendations for restrictions regarding the COVID-19 Pandemic (e.g. social distancing) in an effort to limit this patient's exposure and mitigate transmission in our community.  Due to her co-morbid illnesses, this patient is at least at moderate risk for complications without adequate follow up.  This format is felt to be most appropriate for this patient at this time.  All issues noted in this document were discussed and addressed.  A limited physical exam was performed with this format.  Please refer to the patient's chart for her consent to telehealth for [facility].   Evaluation Performed:  Follow-up visit  Date:  10/06/2018   ID:  Renee Lyons, Renee Lyons [birth year], MRN [id number]  Patient Location: Home Provider Location: Home  PCP:  [name], MD  Cardiologist:  Lauree Chandler, MD  Electrophysiologist:  None   Chief Complaint:  Follow up-chest pain  History of Present Illness:    Renee Lyons is a 68 y.o. female with history of IBS, HTN, HLD, and GERD who is being seen today by virtual e-visit due to the Covid19 pandemic. She was seen in 2013 for evaluation of chest pain. Echo in 2013 with normal LV size and function, mild LVH, mild MR. Treadmill exercise stress test in 2013 with ST depression, chest pain with exercise. Exercise stress myoview 04/08/12 with no evidence of ischemia. Elevated BP with exercise. It was felt that her chest pain may be related to her HTN. Beta blocker stopped due to bradycardia and Norvasc added. I saw her in January and she c/o dyspnea with minimal exertion and substernal chest pain. Cardiac cath 06/27/15 with no evidence of CAD, normal PA pressures. Echo April 2017 with normal LV size and function, no evidence of valvular disease. She was found to have a large goiter and this was affecting her breathing. She had a thyroidectomy in May 2018 and her breathing has been  normal since.   She tells me today that she feels well. She has no chest pain, dyspnea, lower extremity edema, palpitations.   The patient does not have symptoms concerning for COVID-19 infection (fever, chills, cough, or new shortness of breath).    Past Medical History:  Diagnosis Date   Allergic rhinitis    GERD (gastroesophageal reflux disease)    Hx of cardiovascular stress test 04/09/12   a. ETT + for ischemia;  b. ETT-MV: (2013) images with no ischemia, EF  77%; +ECG changes c/w ischemia   Hyperlipidemia    Great response to statin 2020   Hypertension    Hypothyroidism, postsurgical 10/2016   Dr. Elyse Hsu follows   IBS (irritable bowel syndrome)    Multinodular goiter    Total thyroidectomy 10/29/16 done for compressive sx's   Osteoarthritis of both hands 2016   Both hands, wrists, R knee.  Rheum eval 11/2014 NEG (Dr. Trudie Reed).  Dr. Latanya Maudlin to do bilat thumb surgery when pt ready (as of 07/2018).   Seizure disorder [facility]) 1977    Dr Mervyn Skeeters, Neurology.One episode in 1977   Past Surgical History:  Procedure Laterality Date   bladder tacking  1974   CARDIAC CATHETERIZATION N/A 07/03/2015   No angiographic evidence of CAD, normal LV filling pressure and systolic fxn, no pulm HTN.  Procedure: Right/Left Heart Cath and Coronary Angiography;  Surgeon: Burnell Blanks, MD;  Location: [place] [facility];  Service: Cardiovascular;  Laterality: N/A;   COLONOSCOPY  2003 & 2007   Dr Sharlett Iles   TOTAL THYROIDECTOMY  10/2016   Path: benign multinodular goiter (Dr. Celine Ahr, Surg onc at [facility]   TRANSTHORACIC ECHOCARDIOGRAM  09/24/2015   Normal.  EF 60-65%.   uterine polypectomy  2008    Dr Nori Riis     Current Meds  Medication Sig   ALPRAZolam (XANAX) 0.5 MG tablet Take 0.5 mg by mouth daily as needed for anxiety.    amLODipine (NORVASC) 10 MG tablet TAKE 1 TABLET BY MOUTH  DAILY   atorvastatin (LIPITOR) 20 MG tablet Take 1 tablet (20 mg total) by mouth daily.    Cholecalciferol (VITAMIN D) 2000 units CAPS Take 2,000 Units by mouth daily.   fluticasone (FLONASE) 50 MCG/ACT nasal spray SHAKE LIQUID AND USE 2 SPRAYS IN EACH NOSTRIL EVERY DAY   levothyroxine (SYNTHROID) 88 MCG tablet Take 1 tablet by mouth daily.   meloxicam (MOBIC) 15 MG tablet Take 15 mg by mouth as needed for pain.    Multiple Vitamin (MULTIVITAMIN) tablet Take 1 tablet by mouth daily.   nitroGLYCERIN (NITROSTAT) 0.4 MG SL tablet Place 1 tablet (0.4 mg total) under the tongue every 5 (five) minutes as needed for chest pain.   nystatin-triamcinolone ointment (MYCOLOG) APPLY TOPICALLY AS NEEDED FOR SKIN IRRITATION     Allergies:   Achromycin [tetracycline] and Other   Social History   Tobacco Use   Smoking status: Never Smoker   Smokeless tobacco: Never Used  Substance Use Topics   Alcohol use: Yes    Alcohol/week: 1.0 standard drinks    Types: 1 Standard drinks or equivalent per week    Comment: Rarely   Drug use: No     Family Hx: The patient's family history includes Diverticulitis in her brother; Heart attack (age of onset: 42) in her father; Hypertension in her mother; Prostate cancer in her father; Suicidality in her brother; Thalassemia in her maternal aunt and mother. There is no history of Stroke or Diabetes.  ROS:   Please see the history of present illness.    All other systems reviewed and are negative.   Prior CV studies:   The following studies were reviewed today:  Echo April 2017: - Left ventricle: The cavity size was normal. Wall thickness was   normal. Systolic function was normal. The estimated ejection   fraction was in the range of 60% to 65%. Wall motion was normal;   there were no regional wall motion abnormalities. - Left atrium: The atrium was mildly dilated.   Labs/Other Tests and Data Reviewed:    EKG:  No ECG reviewed.  Recent Labs: 07/28/2018: ALT 15; BUN 21; Creatinine, Ser 0.94; Hemoglobin 12.4; Platelets 243.0; Potassium  4.6; Sodium 139 09/27/2018: TSH 0.33   Recent Lipid Panel Lab Results  Component Value Date/Time   CHOL 143 09/27/2018 08:54 AM   TRIG 101.0 09/27/2018 08:54 AM   HDL 48.30 09/27/2018 08:54 AM   CHOLHDL 3 09/27/2018 08:54 AM   LDLCALC 75 09/27/2018 08:54 AM   LDLDIRECT 144.3 02/17/2012 03:40 PM    Wt Readings from Last 3 Encounters:  10/06/18 176 lb (79.8 kg)  07/28/18 177 lb 8 oz (80.5 kg)  04/23/18 176 lb 4 oz (79.9 kg)     Objective:    Vital Signs:  BP 139/61    Pulse (!) 56    Ht 5\' 2"  (1.575 m)    Wt 176 lb (79.8 kg)    BMI 32.19 kg/m    VITAL SIGNS:  reviewed  GEN:  no acute distress  ASSESSMENT & PLAN:    1. Chest pain: No recurrence of chest pain over last few years. No evidence of CAD by cardiac cath November 2017.   2. HTN: BP controlled at home per pt. Continue Norvasc. Renal artery dopplers ok.  3. Mitral regurgitation: No evidence of mitral valve regurgitation on echo April 2017.    COVID-19 Education: The signs and symptoms of COVID-19 were discussed with the patient and how to seek care for testing (follow up with PCP or arrange E-visit).  The importance of social distancing was discussed today.  Time:   Today, I have spent 15 minutes with the patient with telehealth technology discussing the above problems.     Medication Adjustments/Labs and Tests Ordered: Current medicines are reviewed at length with the patient today.  Concerns regarding medicines are outlined above.   Tests Ordered: No orders of the defined types were placed in this encounter.   Medication Changes: No orders of the defined types were placed in this encounter.   Disposition:  Follow up in 1 year(s)  Signed, Lauree Chandler, MD  10/06/2018 10:11 AM    [place]

## 2018-10-06 NOTE — Patient Instructions (Signed)
Medication Instructions:  No changes If you need a refill on your cardiac medications before your next appointment, please call your pharmacy.   Lab work: None today If you have labs (blood work) drawn today and your tests are completely normal, you will receive your results only by: Marland Kitchen MyChart Message (if you have MyChart) OR . A paper copy in the mail If you have any lab test that is abnormal or we need to change your treatment, we will call you to review the results.  Testing/Procedures: none  Follow-Up: At [facility], you and your health needs are our priority.  As part of our continuing mission to provide you with exceptional heart care, we have created designated Provider Care Teams.  These Care Teams include your primary Cardiologist (physician) and Advanced Practice Providers (APPs -  Physician Assistants and Nurse Practitioners) who all work together to provide you with the care you need, when you need it. You will need a follow up appointment in 12 months.  Please call our office 2 months in advance to schedule this appointment.  You may see Dr. Angelena Form or one of the following Advanced Practice Providers on your designated Care Team:   [place], PA-C Melina Copa, PA-C . Ermalinda Barrios, PA-C  Any Other Special Instructions Will Be Listed Below (If Applicable).

## 2018-10-07 ENCOUNTER — Ambulatory Visit: Payer: Medicare Other | Admitting: Cardiovascular Disease

## 2018-11-01 ENCOUNTER — Other Ambulatory Visit: Payer: Self-pay | Admitting: Family Medicine

## 2018-11-26 DIAGNOSIS — E890 Postprocedural hypothyroidism: Secondary | ICD-10-CM | POA: Diagnosis not present

## 2018-11-26 LAB — TSH: TSH: 0.57 (ref 0.41–5.90)

## 2018-12-07 DIAGNOSIS — E890 Postprocedural hypothyroidism: Secondary | ICD-10-CM | POA: Diagnosis not present

## 2018-12-09 DIAGNOSIS — L821 Other seborrheic keratosis: Secondary | ICD-10-CM | POA: Diagnosis not present

## 2018-12-09 DIAGNOSIS — Z85828 Personal history of other malignant neoplasm of skin: Secondary | ICD-10-CM | POA: Diagnosis not present

## 2018-12-09 DIAGNOSIS — L820 Inflamed seborrheic keratosis: Secondary | ICD-10-CM | POA: Diagnosis not present

## 2018-12-16 ENCOUNTER — Encounter: Payer: Self-pay | Admitting: Family Medicine

## 2018-12-19 ENCOUNTER — Encounter: Payer: Self-pay | Admitting: Family Medicine

## 2019-01-06 ENCOUNTER — Encounter: Payer: Self-pay | Admitting: Family Medicine

## 2019-01-06 ENCOUNTER — Other Ambulatory Visit: Payer: Self-pay | Admitting: Family Medicine

## 2019-01-06 MED ORDER — FUROSEMIDE 20 MG PO TABS: ORAL_TABLET | ORAL | 0 refills | Status: DC

## 2019-01-07 DIAGNOSIS — H52202 Unspecified astigmatism, left eye: Secondary | ICD-10-CM | POA: Diagnosis not present

## 2019-01-07 DIAGNOSIS — H5212 Myopia, left eye: Secondary | ICD-10-CM | POA: Diagnosis not present

## 2019-01-07 DIAGNOSIS — Z961 Presence of intraocular lens: Secondary | ICD-10-CM | POA: Diagnosis not present

## 2019-01-07 DIAGNOSIS — H524 Presbyopia: Secondary | ICD-10-CM | POA: Diagnosis not present

## 2019-01-26 ENCOUNTER — Other Ambulatory Visit: Payer: Self-pay | Admitting: Family Medicine

## 2019-01-27 ENCOUNTER — Other Ambulatory Visit: Payer: Self-pay | Admitting: Physician Assistant

## 2019-01-27 DIAGNOSIS — L820 Inflamed seborrheic keratosis: Secondary | ICD-10-CM | POA: Diagnosis not present

## 2019-01-27 DIAGNOSIS — D485 Neoplasm of uncertain behavior of skin: Secondary | ICD-10-CM | POA: Diagnosis not present

## 2019-01-27 NOTE — Telephone Encounter (Signed)
RF request for: Lipitor 20mg  LOV: 2/12 Next ov: not scheduled Last written: 5/18 #30 with 2 refills  I was unsure if it was okay to fill this medication. Please advise, thank you.

## 2019-01-31 DIAGNOSIS — E890 Postprocedural hypothyroidism: Secondary | ICD-10-CM | POA: Diagnosis not present

## 2019-01-31 DIAGNOSIS — N958 Other specified menopausal and perimenopausal disorders: Secondary | ICD-10-CM | POA: Diagnosis not present

## 2019-01-31 DIAGNOSIS — M8588 Other specified disorders of bone density and structure, other site: Secondary | ICD-10-CM | POA: Diagnosis not present

## 2019-02-07 DIAGNOSIS — E890 Postprocedural hypothyroidism: Secondary | ICD-10-CM | POA: Diagnosis not present

## 2019-02-12 ENCOUNTER — Other Ambulatory Visit: Payer: Self-pay | Admitting: Family Medicine

## 2019-02-18 ENCOUNTER — Telehealth: Payer: Self-pay

## 2019-02-18 MED ORDER — FEXOFENADINE HCL 60 MG PO TABS: 60.0000 mg | ORAL_TABLET | Freq: Two times a day (BID) | ORAL | 1 refills | Status: DC

## 2019-02-18 MED ORDER — BUDESONIDE 32 MCG/ACT NA SUSP: 2.0000 | Freq: Every day | NASAL | 1 refills | Status: DC

## 2019-02-18 NOTE — Telephone Encounter (Signed)
We can try a different antihistamine and different nasal spray. I just sent in allegra and rhinocort rx's. Stop claritin tabs and stop fluticasone nasal spray.

## 2019-02-18 NOTE — Telephone Encounter (Signed)
Received voicemail that patient has been having postnasal drip, eyes feel matted. She has been taking claritin OTC and using nasal spray prescribed by you.  Is there stronger allergy med she can take? Please advise, thanks.

## 2019-02-18 NOTE — Telephone Encounter (Signed)
Patient advised and voiced understanding.  

## 2019-03-29 ENCOUNTER — Encounter: Payer: Self-pay | Admitting: Family Medicine

## 2019-07-04 ENCOUNTER — Telehealth: Payer: Self-pay | Admitting: Family Medicine

## 2019-07-04 NOTE — Telephone Encounter (Signed)
Patient advised and voiced understanding.  

## 2019-07-14 ENCOUNTER — Other Ambulatory Visit: Payer: Self-pay

## 2019-07-14 ENCOUNTER — Ambulatory Visit (INDEPENDENT_AMBULATORY_CARE_PROVIDER_SITE_OTHER): Payer: Medicare Other | Admitting: Family Medicine

## 2019-07-14 ENCOUNTER — Encounter: Payer: Self-pay | Admitting: Family Medicine

## 2019-07-14 VITALS — Wt 181.0 lb

## 2019-07-14 DIAGNOSIS — J309 Allergic rhinitis, unspecified: Secondary | ICD-10-CM | POA: Diagnosis not present

## 2019-07-14 DIAGNOSIS — R0982 Postnasal drip: Secondary | ICD-10-CM | POA: Diagnosis not present

## 2019-07-14 MED ORDER — WAL-FEX ALLERGY 60 MG PO TABS: ORAL_TABLET | ORAL | 3 refills | Status: DC

## 2019-07-14 MED ORDER — BUDESONIDE 32 MCG/ACT NA SUSP: 2.0000 | Freq: Every day | NASAL | 3 refills | Status: DC

## 2019-07-14 NOTE — Progress Notes (Signed)
Virtual Visit via Video Note  I connected with pt on 07/14/19 at  4:00 PM EST by a video enabled telemedicine application and verified that I am speaking with the correct person using two identifiers.  Location patient: home Location provider:work or home office Persons participating in the virtual visit: patient, provider  I discussed the limitations of evaluation and management by telemedicine and the availability of in person appointments. The patient expressed understanding and agreed to proceed.  Telemedicine visit is a necessity given the COVID-19 restrictions in place at the current time.  HPI: 69 y/o WM being seen today for sore throat. Scratchy throat waxing and waning last couple months. More significant last 2 d, eyes watery more, feels some PND.  Doing neti pot some. Has need to clear throat a lot, occ coughing--nonproductive. Allegra has helped in the past but they make her a bit drowsy.  She takes rhinocort daily and also takes flonase. No signif fatigue/malaise and no fevers.   Denies sensation of reflux.  ROS: See pertinent positives and negatives per HPI.  Past Medical History:  Diagnosis Date  . Allergic rhinitis   . GERD (gastroesophageal reflux disease)   . History of thyroidectomy, total    Total thyroidectomy 10/29/16 done due to compressive sx's  . Hx of cardiovascular stress test 04/09/2012   a. ETT + for ischemia;  b. ETT-MV: (2013) images with no ischemia, EF  77%; +ECG changes c/w ischemia.  Cath and echo normal 2017.  Marland Kitchen Hyperlipidemia    Great response to statin 2020  . Hypertension   . Hypothyroidism, postsurgical 10/2016   Dr. Elyse Hsu follows  . IBS (irritable bowel syndrome)   . Osteoarthritis of both hands 2016   Both hands, wrists, R knee.  Rheum eval 11/2014 NEG (Dr. Trudie Reed).  Dr. Latanya Maudlin to do bilat thumb surgery when pt ready (as of 07/2018).  . Seizure disorder [facility]) 1977    Dr Mervyn Skeeters, Neurology.One episode in 1977    Past Surgical History:   Procedure Laterality Date  . bladder tacking  1974  . CARDIAC CATHETERIZATION N/A 07/03/2015   No angiographic evidence of CAD, normal LV filling pressure and systolic fxn, no pulm HTN.  Procedure: Right/Left Heart Cath and Coronary Angiography;  Surgeon: Burnell Blanks, MD;  Location: [place] [facility];  Service: Cardiovascular;  Laterality: N/A;  . COLONOSCOPY  2003 & 2007   Dr Sharlett Iles  . TOTAL THYROIDECTOMY  10/2016   Path: benign multinodular goiter (Dr. Celine Ahr, Surg onc at [facility]  Marland Kitchen TRANSTHORACIC ECHOCARDIOGRAM  09/24/2015   Normal.  EF 60-65%.  Marland Kitchen uterine polypectomy  2008    Dr Nori Riis    Family History  Problem Relation Age of Onset  . Prostate cancer Father   . Heart attack Father 12  . Hypertension Mother   . Thalassemia Mother   . Diverticulitis Brother   . Suicidality Brother   . Thalassemia Maternal Aunt   . Stroke Neg Hx   . Diabetes Neg Hx     SOCIAL HX:  Social History   Socioeconomic History  . Marital status: Married    Spouse name: Not on file  . Number of children: 0  . Years of education: Not on file  . Highest education level: Not on file  Occupational History  . Occupation: Retired, works part-time now for [employer]: RETIRED  Tobacco Use  . Smoking status: Never Smoker  . Smokeless tobacco: Never Used  Substance and Sexual Activity  .  Alcohol use: Yes    Alcohol/week: 1.0 standard drinks    Types: 1 Standard drinks or equivalent per week    Comment: Rarely  . Drug use: No  . Sexual activity: Not on file  Other Topics Concern  . Not on file  Social History Narrative   Married, husband is pt of mine Krystn Solano).   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     No tobacco.   Alc: 1 drink per week.  No drugs.         Social Determinants of Health   Financial Resource Strain:   . Difficulty of Paying Living Expenses: Not on file  Food Insecurity:   . Worried About Charity fundraiser in the Last Year: Not on file  . Ran  Out of Food in the Last Year: Not on file  Transportation Needs:   . Lack of Transportation (Medical): Not on file  . Lack of Transportation (Non-Medical): Not on file  Physical Activity:   . Days of Exercise per Week: Not on file  . Minutes of Exercise per Session: Not on file  Stress:   . Feeling of Stress : Not on file  Social Connections:   . Frequency of Communication with Friends and Family: Not on file  . Frequency of Social Gatherings with Friends and Family: Not on file  . Attends Religious Services: Not on file  . Active Member of Clubs or Organizations: Not on file  . Attends Archivist Meetings: Not on file  . Marital Status: Not on file      Current Outpatient Medications:  .  ALPRAZolam (XANAX) 0.5 MG tablet, Take 0.5 mg by mouth daily as needed for anxiety. , Disp: , Rfl: 5 .  amLODipine (NORVASC) 10 MG tablet, TAKE 1 TABLET BY MOUTH  DAILY, Disp: 90 tablet, Rfl: 2 .  atorvastatin (LIPITOR) 20 MG tablet, TAKE 1 TABLET BY MOUTH DAILY, Disp: 90 tablet, Rfl: 3 .  budesonide (RHINOCORT AQUA) 32 MCG/ACT nasal spray, Place 2 sprays into both nostrils daily., Disp: 5 mL, Rfl: 1 .  Calcium-Magnesium-Vitamin D (CALCIUM 500 PO), Take 250 tablets by mouth., Disp: , Rfl:  .  Cholecalciferol (VITAMIN D) 2000 units CAPS, Take 2,000 Units by mouth daily., Disp: , Rfl:  .  fluticasone (FLONASE) 50 MCG/ACT nasal spray, SHAKE LIQUID AND USE 2  SPRAYS IN EACH NOSTRIL  EVERY DAY, Disp: 48 g, Rfl: 3 .  levothyroxine (SYNTHROID) 88 MCG tablet, Take 1 tablet by mouth daily., Disp: , Rfl:  .  meloxicam (MOBIC) 15 MG tablet, Take 15 mg by mouth as needed for pain. , Disp: , Rfl: 2 .  Multiple Vitamin (MULTIVITAMIN) tablet, Take 1 tablet by mouth daily., Disp: , Rfl:  .  nystatin-triamcinolone ointment (MYCOLOG), APPLY TOPICALLY AS NEEDED FOR SKIN IRRITATION, Disp: , Rfl:  .  WAL-FEX ALLERGY 60 MG tablet, SMARTSIG:1 Tablet(s) By Mouth Twice Daily, Disp: , Rfl:  .  furosemide (LASIX)  20 MG tablet, 1 tab po qd as needed for swelling of legs (Patient not taking: Reported on 07/14/2019), Disp: 15 tablet, Rfl: 0 .  nitroGLYCERIN (NITROSTAT) 0.4 MG SL tablet, Place 1 tablet (0.4 mg total) under the tongue every 5 (five) minutes as needed for chest pain. (Patient not taking: Reported on 07/14/2019), Disp: 25 tablet, Rfl: 6  EXAM:  VITALS per patient if applicable: Wt 181 lb (82.1 kg)   BMI 33.11 kg/m    GENERAL: alert, oriented, appears well and in no  acute distress  HEENT: atraumatic, conjunttiva clear, no obvious abnormalities on inspection of external nose and ears  NECK: normal movements of the head and neck  LUNGS: on inspection no signs of respiratory distress, breathing rate appears normal, no obvious gross SOB, gasping or wheezing  CV: no obvious cyanosis  MS: moves all visible extremities without noticeable abnormality  PSYCH/NEURO: pleasant and cooperative, no obvious depression or anxiety, speech and thought processing grossly intact  LABS: none today  Lab Results  Component Value Date   TSH 0.57 11/26/2018     Chemistry      Component Value Date/Time   NA 139 07/28/2018 1008   K 4.6 07/28/2018 1008   CL 105 07/28/2018 1008   CO2 27 07/28/2018 1008   BUN 21 07/28/2018 1008   CREATININE 0.94 07/28/2018 1008   CREATININE 0.90 06/27/2015 1650      Component Value Date/Time   CALCIUM 9.4 07/28/2018 1008   ALKPHOS 79 07/28/2018 1008   AST 21 07/28/2018 1008   ALT 15 07/28/2018 1008   BILITOT 0.5 07/28/2018 1008     Lab Results  Component Value Date   CHOL 143 09/27/2018   HDL 48.30 09/27/2018   LDLCALC 75 09/27/2018   LDLDIRECT 144.3 02/17/2012   TRIG 101.0 09/27/2018   CHOLHDL 3 09/27/2018    ASSESSMENT AND PLAN:  Discussed the following assessment and plan:  Allergic rhinitis w/PND. Restart allegra but at lowest dose to see if helps adequately w/out causing bothersome sedation. Continue daily rhinocort but STOP flonase.  Also  discussed possible contribution of LPR to her throat complaints. Recommended trial of otc pepcid for a week to see how that helps IF sx's are not signif improved on restart of allegra.  I discussed the assessment and treatment plan with the patient. The patient was provided an opportunity to ask questions and all were answered. The patient agreed with the plan and demonstrated an understanding of the instructions.   The patient was advised to call back or seek an in-person evaluation if the symptoms worsen or if the condition fails to improve as anticipated.  F/u: prn  Signed:  Crissie Sickles, MD           07/14/2019

## 2019-07-18 DIAGNOSIS — Z1211 Encounter for screening for malignant neoplasm of colon: Secondary | ICD-10-CM

## 2019-07-18 HISTORY — DX: Encounter for screening for malignant neoplasm of colon: Z12.11

## 2019-07-21 LAB — HEMOGLOBIN A1C: Hemoglobin A1C: 5.3

## 2019-07-25 ENCOUNTER — Telehealth: Payer: Self-pay

## 2019-07-25 NOTE — Telephone Encounter (Signed)
Noted  

## 2019-07-25 NOTE — Telephone Encounter (Signed)
Received VM from Renee Lyons, who did a house call on Thursday, 2/4. Urine dipstick was performed and pt had protein of 2+. FYI for PCP

## 2019-08-09 ENCOUNTER — Encounter: Payer: Self-pay | Admitting: Family Medicine

## 2019-08-11 DIAGNOSIS — E890 Postprocedural hypothyroidism: Secondary | ICD-10-CM | POA: Diagnosis not present

## 2019-08-11 LAB — TSH: TSH: 4.04 (ref 0.41–5.90)

## 2019-08-12 ENCOUNTER — Encounter: Payer: Self-pay | Admitting: Family Medicine

## 2019-08-18 DIAGNOSIS — E890 Postprocedural hypothyroidism: Secondary | ICD-10-CM | POA: Diagnosis not present

## 2019-08-19 ENCOUNTER — Other Ambulatory Visit: Payer: Self-pay | Admitting: Cardiovascular Disease

## 2019-08-29 ENCOUNTER — Telehealth: Payer: Self-pay | Admitting: Family Medicine

## 2019-08-29 ENCOUNTER — Encounter: Payer: Self-pay | Admitting: Family Medicine

## 2019-08-29 NOTE — Telephone Encounter (Signed)
Pt's husband, Herbie Baltimore advised of results. Okay per [facility]

## 2019-08-29 NOTE — Telephone Encounter (Signed)
Stool test was NEGATIVE. This is good.  Plan repeat 1 yr.

## 2019-08-30 ENCOUNTER — Encounter: Payer: Self-pay | Admitting: Family Medicine

## 2019-09-19 ENCOUNTER — Encounter: Payer: Self-pay | Admitting: Family Medicine

## 2019-10-03 ENCOUNTER — Other Ambulatory Visit: Payer: Self-pay

## 2019-10-03 MED ORDER — LEVOTHYROXINE SODIUM 88 MCG PO TABS: 88.0000 ug | ORAL_TABLET | Freq: Every day | ORAL | Status: AC

## 2019-10-05 ENCOUNTER — Encounter: Payer: Self-pay | Admitting: Family Medicine

## 2019-10-05 ENCOUNTER — Ambulatory Visit (INDEPENDENT_AMBULATORY_CARE_PROVIDER_SITE_OTHER): Payer: Medicare Other | Admitting: Family Medicine

## 2019-10-05 ENCOUNTER — Other Ambulatory Visit: Payer: Self-pay

## 2019-10-05 VITALS — BP 133/78 | HR 54 | Temp 97.8°F | Resp 16 | Ht 62.0 in | Wt 180.8 lb

## 2019-10-05 DIAGNOSIS — Z0000 Encounter for general adult medical examination without abnormal findings: Secondary | ICD-10-CM | POA: Diagnosis not present

## 2019-10-05 DIAGNOSIS — I10 Essential (primary) hypertension: Secondary | ICD-10-CM | POA: Diagnosis not present

## 2019-10-05 DIAGNOSIS — R809 Proteinuria, unspecified: Secondary | ICD-10-CM | POA: Diagnosis not present

## 2019-10-05 DIAGNOSIS — E7800 Pure hypercholesterolemia, unspecified: Secondary | ICD-10-CM

## 2019-10-05 LAB — LIPID PANEL
Cholesterol: 161 mg/dL (ref 0–200)
HDL: 55.5 mg/dL (ref >39.00)
LDL Cholesterol: 89 mg/dL (ref 0–99)
NonHDL: 105.13
Total CHOL/HDL Ratio: 3
Triglycerides: 80 mg/dL (ref 0.0–149.0)
VLDL: 16 mg/dL (ref 0.0–40.0)

## 2019-10-05 LAB — COMPREHENSIVE METABOLIC PANEL
ALT: 18 U/L (ref 0–35)
AST: 27 U/L (ref 0–37)
Albumin: 4.2 g/dL (ref 3.5–5.2)
Alkaline Phosphatase: 80 U/L (ref 39–117)
BUN: 18 mg/dL (ref 6–23)
CO2: 28 mEq/L (ref 19–32)
Calcium: 9.4 mg/dL (ref 8.4–10.5)
Chloride: 105 mEq/L (ref 96–112)
Creatinine, Ser: 0.89 mg/dL (ref 0.40–1.20)
GFR: 62.53 mL/min (ref >60.00)
Glucose, Bld: 91 mg/dL (ref 70–99)
Potassium: 4.9 mEq/L (ref 3.5–5.1)
Sodium: 140 mEq/L (ref 135–145)
Total Bilirubin: 0.6 mg/dL (ref 0.2–1.2)
Total Protein: 6.9 g/dL (ref 6.0–8.3)

## 2019-10-05 LAB — MICROALBUMIN / CREATININE URINE RATIO
Creatinine,U: 88.4 mg/dL
Microalb Creat Ratio: 0.8 mg/g (ref 0.0–30.0)
Microalb, Ur: <0.7 mg/dL (ref 0.0–1.9)

## 2019-10-05 MED ORDER — MELOXICAM 15 MG PO TABS: 15.0000 mg | ORAL_TABLET | Freq: Every day | ORAL | 3 refills | Status: DC

## 2019-10-05 NOTE — Progress Notes (Signed)
Office Note 10/05/2019  CC:  Chief Complaint  Patient presents with  . Annual Exam    pt is fasting    HPI:  Renee Lyons is a 69 y.o. White female with HTN, HLD, and postsurgical hypothyroidism who is here for annual health maintenance exam. Her GYN MD is Dr. Nori Riis with [facility] in [place].  Working in yard daily, some yoga.  Walks some at night. Soon to restart at [facility] walk. Diet: fair, decreased meat intake, drinking more water.  Limiting colas.  Pt was on meloxicam for osteoarthritis of hands, wrists, feet. Meloxicam prn was helpful. She decided not to pursue surgery for thumbs after all (Dr. Latanya Maudlin). Asks for RF of meloxicam 15mg  qd.   Past Medical History:  Diagnosis Date  . Allergic rhinitis   . Colon cancer screening 07/2019   FIT test NEG 07/24/19. Rpt 1 yr  . GERD (gastroesophageal reflux disease)   . History of thyroidectomy, total    Total thyroidectomy 10/29/16, [facility], surgery done due to compressive sx's  . Hx of cardiovascular stress test 04/09/2012   a. ETT + for ischemia;  b. ETT-MV: (2013) images with no ischemia, EF  77%; +ECG changes c/w ischemia.  Cath and echo normal 2017.  Marland Kitchen Hyperlipidemia    Great response to statin 2020  . Hypertension   . Hypothyroidism, postsurgical 10/2016   Dr. Elyse Hsu follows  . IBS (irritable bowel syndrome)   . Osteoarthritis of both hands 2016   Both hands, wrists, R knee.  Rheum eval 11/2014 NEG (Dr. Trudie Reed).  Dr. Latanya Maudlin to do bilat thumb surgery when pt ready (as of 07/2018).  . Seizure disorder [facility]) 1977    Dr Mervyn Skeeters, Neurology.One episode in 1977    Past Surgical History:  Procedure Laterality Date  . bladder tacking  1974  . CARDIAC CATHETERIZATION N/A 07/03/2015   No angiographic evidence of CAD, normal LV filling pressure and systolic fxn, no pulm HTN.  Procedure: Right/Left Heart Cath and Coronary Angiography;  Surgeon: Burnell Blanks, MD;  Location: [facility];  Service:  Cardiovascular;  Laterality: N/A;  . COLONOSCOPY  2003 & 2007   Dr Sharlett Iles  . TOTAL THYROIDECTOMY  10/2016   Path: benign multinodular goiter (Dr. Celine Ahr, Surg onc at [facility].  Marland Kitchen TRANSTHORACIC ECHOCARDIOGRAM  09/24/2015   Normal.  EF 60-65%.  Marland Kitchen uterine polypectomy  2008    Dr Nori Riis    Family History  Problem Relation Age of Onset  . Prostate cancer Father   . Heart attack Father 31  . Hypertension Mother   . Thalassemia Mother   . Diverticulitis Brother   . Suicidality Brother   . Thalassemia Maternal Aunt   . Stroke Neg Hx   . Diabetes Neg Hx     Social History   Socioeconomic History  . Marital status: Married    Spouse name: Not on file  . Number of children: 0  . Years of education: Not on file  . Highest education level: Not on file  Occupational History  . Occupation: Retired, works part-time now for [employer]: RETIRED  Tobacco Use  . Smoking status: Never Smoker  . Smokeless tobacco: Never Used  Substance and Sexual Activity  . Alcohol use: Yes    Alcohol/week: 1.0 standard drinks    Types: 1 Standard drinks or equivalent per week    Comment: Rarely  . Drug use: No  . Sexual activity: Not on file  Other Topics  Concern  . Not on file  Social History Narrative   Married, husband is pt of mine Dennie Snow).   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     No tobacco.   Alc: 1 drink per week.  No drugs.         Social Determinants of Health   Financial Resource Strain:   . Difficulty of Paying Living Expenses:   Food Insecurity:   . Worried About Charity fundraiser in the Last Year:   . Arboriculturist in the Last Year:   Transportation Needs:   . Film/video editor (Medical):   Marland Kitchen Lack of Transportation (Non-Medical):   Physical Activity:   . Days of Exercise per Week:   . Minutes of Exercise per Session:   Stress:   . Feeling of Stress :   Social Connections:   . Frequency of Communication with Friends and Family:   . Frequency of  Social Gatherings with Friends and Family:   . Attends Religious Services:   . Active Member of Clubs or Organizations:   . Attends Archivist Meetings:   Marland Kitchen Marital Status:   Intimate Partner Violence:   . Fear of Current or Ex-Partner:   . Emotionally Abused:   Marland Kitchen Physically Abused:   . Sexually Abused:     Outpatient Medications Prior to Visit  Medication Sig Dispense Refill  . ALPRAZolam (XANAX) 0.5 MG tablet Take 0.5 mg by mouth daily as needed for anxiety.   5  . amLODipine (NORVASC) 10 MG tablet Take 1 tablet (10 mg total) by mouth daily. Please make annual appt in April with Dr. Angelena Form for refills. Thank you 30 tablet 1  . atorvastatin (LIPITOR) 20 MG tablet TAKE 1 TABLET BY MOUTH DAILY 90 tablet 3  . Cholecalciferol (VITAMIN D) 2000 units CAPS Take 2,000 Units by mouth daily.    . fluticasone (FLONASE) 50 MCG/ACT nasal spray SHAKE LIQUID AND USE 2  SPRAYS IN EACH NOSTRIL  EVERY DAY 48 g 3  . levothyroxine (SYNTHROID) 88 MCG tablet Take 1 tablet (88 mcg total) by mouth daily. Take 1 tablet 6 days a week and just 1/2 tablet 1 day a week; in AM on empty stomach for thyroid (name brand only DAW) (Patient taking differently: Take 88 mcg by mouth daily. Take 1 tablet daily.)    . Multiple Vitamin (MULTIVITAMIN) tablet Take 1 tablet by mouth daily.    . budesonide (RHINOCORT AQUA) 32 MCG/ACT nasal spray Place 2 sprays into both nostrils daily. (Patient not taking: Reported on 10/05/2019) 15 mL 3  . Calcium-Magnesium-Vitamin D (CALCIUM 500 PO) Take 250 tablets by mouth.    . furosemide (LASIX) 20 MG tablet 1 tab po qd as needed for swelling of legs (Patient not taking: Reported on 10/05/2019) 15 tablet 0  . nitroGLYCERIN (NITROSTAT) 0.4 MG SL tablet Place 1 tablet (0.4 mg total) under the tongue every 5 (five) minutes as needed for chest pain. (Patient not taking: Reported on 07/14/2019) 25 tablet 6  . nystatin-triamcinolone ointment (MYCOLOG) APPLY TOPICALLY AS NEEDED FOR SKIN  IRRITATION    . WAL-FEX ALLERGY 60 MG tablet 1/2 tab po bid for allergies (Patient not taking: Reported on 10/05/2019) 90 tablet 3  . meloxicam (MOBIC) 15 MG tablet Take 15 mg by mouth as needed for pain.   2   No facility-administered medications prior to visit.    Allergies  Allergen Reactions  . Achromycin [Tetracycline] Hives  . Other Rash    "  Mycins" - hives    ROS Review of Systems  Constitutional: Negative for appetite change, chills, fatigue and fever.  HENT: Negative for congestion, dental problem, ear pain and sore throat.   Eyes: Negative for discharge, redness and visual disturbance.  Respiratory: Negative for cough, chest tightness, shortness of breath and wheezing.   Cardiovascular: Negative for chest pain, palpitations and leg swelling.  Gastrointestinal: Negative for abdominal pain, blood in stool, diarrhea, nausea and vomiting.  Genitourinary: Negative for difficulty urinating, dysuria, flank pain, frequency, hematuria and urgency.  Musculoskeletal: Positive for arthralgias (osteoarth thumbs, wrists, knees, feet). Negative for back pain, joint swelling, myalgias and neck stiffness.  Skin: Negative for pallor and rash.  Neurological: Negative for dizziness, speech difficulty, weakness and headaches.  Hematological: Negative for adenopathy. Does not bruise/bleed easily.  Psychiatric/Behavioral: Negative for confusion and sleep disturbance. The patient is not nervous/anxious.    PE; Blood pressure 133/78, pulse (!) 54, temperature 97.8 F (36.6 C), temperature source Temporal, resp. rate 16, height 5\' 2"  (1.575 m), weight 180 lb 12.8 oz (82 kg), SpO2 100 %. Body mass index is 33.07 kg/m. Exam chaperoned by Deveron Furlong, CMA.  Gen: Alert, well appearing.  Patient is oriented to person, place, time, and situation. AFFECT: pleasant, lucid thought and speech. ENT: Ears: EACs clear, normal epithelium.  TMs with good light reflex and landmarks bilaterally.  Eyes: no  injection, icteris, swelling, or exudate.  EOMI, PERRLA. Nose: no drainage or turbinate edema/swelling.  No injection or focal lesion.  Mouth: lips without lesion/swelling.  Oral mucosa pink and moist.  Dentition intact and without obvious caries or gingival swelling.  Oropharynx without erythema, exudate, or swelling.  Neck: supple/nontender.  No LAD, mass, or TM.  Carotid pulses 2+ bilaterally, without bruits. CV: RRR, no m/r/g.   LUNGS: CTA bilat, nonlabored resps, good aeration in all lung fields. ABD: soft, NT, ND, BS normal.  No hepatospenomegaly or mass.  No bruits. EXT: no clubbing, cyanosis, or edema.  Musculoskeletal: no joint swelling, erythema, warmth, or tenderness.  ROM of all joints intact. Skin - no sores or suspicious lesions or rashes or color changes   Pertinent labs:  Lab Results  Component Value Date   TSH 4.04 08/11/2019   Lab Results  Component Value Date   WBC 6.0 07/28/2018   HGB 12.4 07/28/2018   HCT 37.1 07/28/2018   MCV 86.1 07/28/2018   PLT 243.0 07/28/2018   Lab Results  Component Value Date   CREATININE 0.94 07/28/2018   BUN 21 07/28/2018   NA 139 07/28/2018   K 4.6 07/28/2018   CL 105 07/28/2018   CO2 27 07/28/2018   Lab Results  Component Value Date   ALT 15 07/28/2018   AST 21 07/28/2018   ALKPHOS 79 07/28/2018   BILITOT 0.5 07/28/2018   Lab Results  Component Value Date   CHOL 143 09/27/2018   Lab Results  Component Value Date   HDL 48.30 09/27/2018   Lab Results  Component Value Date   LDLCALC 75 09/27/2018   Lab Results  Component Value Date   TRIG 101.0 09/27/2018   Lab Results  Component Value Date   CHOLHDL 3 09/27/2018   Lab Results  Component Value Date   HGBA1C 5.3 07/21/2019   ASSESSMENT AND PLAN:   Health maintenance exam: Reviewed age and gender appropriate health maintenance issues (prudent diet, regular exercise, health risks of tobacco and excessive alcohol, use of seatbelts, fire alarms in home, use  of sunscreen).  Also reviewed age and gender appropriate health screening as well as vaccine recommendations. Vaccines:  All UTD.  Covid 19->she has had this vaccine. Labs: TSH and A1c UTD.  FLP, CMET, CBC ordered (HLD, HTN). Cervical ca screening: Dr. Neal-GYN--> Pap smear done 07/21/18-->all ok per pt. Colon ca screening: last colonoscopy was 2007 by Dr. Sharlett Iles. She had a cologuard that was neg in 04/2016. Normal FIT 07/2019 via insurer's home visit.  Rpt FIT 1 yr. Breast ca screening: last mammogram 07/21/2018-->normal-->pt to arrange. Osteoporosis screening: DEXA 10/2015 T-score -2.0 ([facility])->done 2020 at [facility]->no change.  Osteoarthritis: multiple sites.  I'll continue her on meloxicam 15mg  qd prn that her orthopedist had started her on last year.  Muscle cramps->Take 3 ounces of tonic water every evening and one over the counter magnesium hydroxide (500mg ) tab every evening-->this can help with muscle cramps.  An After Visit Summary was printed and given to the patient.  FOLLOW UP:  Return in about 6 months (around 04/05/2020) for routine chronic illness f/u.  Signed:  Crissie Sickles, MD           10/05/2019

## 2019-10-05 NOTE — Patient Instructions (Signed)
Take 3 ounces of tonic water every evening and one over the counter magnesium hydroxide (500mg ) tab every evening-->this can help with muscle cramps.   Health Maintenance, Female Adopting a healthy lifestyle and getting preventive care are important in promoting health and wellness. Ask your health care provider about:  The right schedule for you to have regular tests and exams.  Things you can do on your own to prevent diseases and keep yourself healthy. What should I know about diet, weight, and exercise? Eat a healthy diet   Eat a diet that includes plenty of vegetables, fruits, low-fat dairy products, and lean protein.  Do not eat a lot of foods that are high in solid fats, added sugars, or sodium. Maintain a healthy weight Body mass index (BMI) is used to identify weight problems. It estimates body fat based on height and weight. Your health care provider can help determine your BMI and help you achieve or maintain a healthy weight. Get regular exercise Get regular exercise. This is one of the most important things you can do for your health. Most adults should:  Exercise for at least 150 minutes each week. The exercise should increase your heart rate and make you sweat (moderate-intensity exercise).  Do strengthening exercises at least twice a week. This is in addition to the moderate-intensity exercise.  Spend less time sitting. Even light physical activity can be beneficial. Watch cholesterol and blood lipids Have your blood tested for lipids and cholesterol at 71 years of age, then have this test every 5 years. Have your cholesterol levels checked more often if:  Your lipid or cholesterol levels are high.  You are older than 71 years of age.  You are at high risk for heart disease. What should I know about cancer screening? Depending on your health history and family history, you may need to have cancer screening at various ages. This may include screening for:  Breast  cancer.  Cervical cancer.  Colorectal cancer.  Skin cancer.  Lung cancer. What should I know about heart disease, diabetes, and high blood pressure? Blood pressure and heart disease  High blood pressure causes heart disease and increases the risk of stroke. This is more likely to develop in people who have high blood pressure readings, are of African descent, or are overweight.  Have your blood pressure checked: ? Every 3-5 years if you are 49-21 years of age. ? Every year if you are 41 years old or older. Diabetes Have regular diabetes screenings. This checks your fasting blood sugar level. Have the screening done:  Once every three years after age 28 if you are at a normal weight and have a low risk for diabetes.  More often and at a younger age if you are overweight or have a high risk for diabetes. What should I know about preventing infection? Hepatitis B If you have a higher risk for hepatitis B, you should be screened for this virus. Talk with your health care provider to find out if you are at risk for hepatitis B infection. Hepatitis C Testing is recommended for:  Everyone born from [birth year] through 1967.  Anyone with known risk factors for hepatitis C. Sexually transmitted infections (STIs)  Get screened for STIs, including gonorrhea and chlamydia, if: ? You are sexually active and are younger than 71 years of age. ? You are older than 71 years of age and your health care provider tells you that you are at risk for this type of infection. ?  Your sexual activity has changed since you were last screened, and you are at increased risk for chlamydia or gonorrhea. Ask your health care provider if you are at risk.  Ask your health care provider about whether you are at high risk for HIV. Your health care provider may recommend a prescription medicine to help prevent HIV infection. If you choose to take medicine to prevent HIV, you should first get tested for HIV. You should  then be tested every 3 months for as long as you are taking the medicine. Pregnancy  If you are about to stop having your period (premenopausal) and you may become pregnant, seek counseling before you get pregnant.  Take 400 to 800 micrograms (mcg) of folic acid every day if you become pregnant.  Ask for birth control (contraception) if you want to prevent pregnancy. Osteoporosis and menopause Osteoporosis is a disease in which the bones lose minerals and strength with aging. This can result in bone fractures. If you are 96 years old or older, or if you are at risk for osteoporosis and fractures, ask your health care provider if you should:  Be screened for bone loss.  Take a calcium or vitamin D supplement to lower your risk of fractures.  Be given hormone replacement therapy (HRT) to treat symptoms of menopause. Follow these instructions at home: Lifestyle  Do not use any products that contain nicotine or tobacco, such as cigarettes, e-cigarettes, and chewing tobacco. If you need help quitting, ask your health care provider.  Do not use street drugs.  Do not share needles.  Ask your health care provider for help if you need support or information about quitting drugs. Alcohol use  Do not drink alcohol if: ? Your health care provider tells you not to drink. ? You are pregnant, may be pregnant, or are planning to become pregnant.  If you drink alcohol: ? Limit how much you use to 0-1 drink a day. ? Limit intake if you are breastfeeding.  Be aware of how much alcohol is in your drink. In the [place] one drink equals one 12 oz bottle of beer (355 mL), one 5 oz glass of wine (148 mL), or one 1 oz glass of hard liquor (44 mL). General instructions  Schedule regular health, dental, and eye exams.  Stay current with your vaccines.  Tell your health care provider if: ? You often feel depressed. ? You have ever been abused or do not feel safe at home. Summary  Adopting a  healthy lifestyle and getting preventive care are important in promoting health and wellness.  Follow your health care provider's instructions about healthy diet, exercising, and getting tested or screened for diseases.  Follow your health care provider's instructions on monitoring your cholesterol and blood pressure. This information is not intended to replace advice given to you by your health care provider. Make sure you discuss any questions you have with your health care provider. Document Revised: 05/26/2018 Document Reviewed: 05/26/2018 Elsevier Patient Education  2020 [employer].

## 2019-10-06 ENCOUNTER — Encounter: Payer: Self-pay | Admitting: Family Medicine

## 2019-10-06 LAB — CBC WITH DIFFERENTIAL/PLATELET
Basophils Absolute: 0.1 10*3/uL (ref 0.0–0.1)
Basophils Relative: 1 % (ref 0.0–3.0)
Eosinophils Absolute: 0.1 10*3/uL (ref 0.0–0.7)
Eosinophils Relative: 2.1 % (ref 0.0–5.0)
HCT: 35.5 % — ABNORMAL LOW (ref 36.0–46.0)
Hemoglobin: 11.8 g/dL — ABNORMAL LOW (ref 12.0–15.0)
Lymphocytes Relative: 29.8 % (ref 12.0–46.0)
Lymphs Abs: 1.7 10*3/uL (ref 0.7–4.0)
MCHC: 33.3 g/dL (ref 30.0–36.0)
MCV: 86.9 fl (ref 78.0–100.0)
Monocytes Absolute: 0.4 10*3/uL (ref 0.1–1.0)
Monocytes Relative: 6.9 % (ref 3.0–12.0)
Neutro Abs: 3.5 10*3/uL (ref 1.4–7.7)
Neutrophils Relative %: 60.2 % (ref 43.0–77.0)
Platelets: 236 10*3/uL (ref 150.0–400.0)
RBC: 4.08 Mil/uL (ref 3.87–5.11)
RDW: 13.2 % (ref 11.5–15.5)
WBC: 5.8 10*3/uL (ref 4.0–10.5)

## 2019-10-13 DIAGNOSIS — M9903 Segmental and somatic dysfunction of lumbar region: Secondary | ICD-10-CM | POA: Diagnosis not present

## 2019-10-13 DIAGNOSIS — M5432 Sciatica, left side: Secondary | ICD-10-CM | POA: Diagnosis not present

## 2019-10-14 ENCOUNTER — Ambulatory Visit: Payer: Medicare Other | Admitting: Cardiovascular Disease

## 2019-10-14 ENCOUNTER — Encounter: Payer: Self-pay | Admitting: Cardiovascular Disease

## 2019-10-14 ENCOUNTER — Other Ambulatory Visit: Payer: Self-pay

## 2019-10-14 VITALS — BP 130/70 | HR 59 | Ht 62.0 in | Wt 179.8 lb

## 2019-10-14 DIAGNOSIS — I10 Essential (primary) hypertension: Secondary | ICD-10-CM | POA: Diagnosis not present

## 2019-10-14 NOTE — Progress Notes (Signed)
Chief Complaint  Patient presents with  . Follow-up    HTN   History of Present Illness: 68 yo female with history of IBS, HTN, HLD, and GERD here today for cardiac follow up. She was seen as a new patient in 2013 for evaluation of chest pain. Echo in 2013 with normal LV size and function, mild LVH, mild MR. Treadmill exercise stress test on 04/02/12 with ST depression, chest pain with exercise. Exercise stress myoview 04/08/12 with no evidence of ischemia. Elevated BP with exercise. It was felt that her chest pain may be related to her HTN. Beta blocker stopped due to bradycardia and Norvasc added. I saw her in January and she c/o dyspnea with minimal exertion and substernal chest pain. Cardiac cath 06/27/15 with no evidence of CAD, normal PA pressures. Echo April 2017 with normal LV size and function, no evidence of valvular disease.   She is here today for follow up. The patient denies any chest pain, dyspnea, palpitations, lower extremity edema, orthopnea, PND, dizziness, near syncope or syncope.   Primary Care Physician: [name], MD   Past Medical History:  Diagnosis Date  . Allergic rhinitis   . Colon cancer screening 07/2019   FIT test NEG 07/24/19. Rpt 1 yr  . GERD (gastroesophageal reflux disease)   . History of thyroidectomy, total    Total thyroidectomy 10/29/16, [facility], surgery done due to compressive sx's  . Hx of cardiovascular stress test 04/09/2012   a. ETT + for ischemia;  b. ETT-MV: (2013) images with no ischemia, EF  77%; +ECG changes c/w ischemia.  Cath and echo normal 2017.  Marland Kitchen Hyperlipidemia    Great response to statin 2020  . Hypertension   . Hypothyroidism, postsurgical 10/2016   Dr. Elyse Hsu follows  . IBS (irritable bowel syndrome)   . Osteoarthritis of both hands 2016   Both hands, wrists, R knee.  Rheum eval 11/2014 NEG (Dr. Trudie Reed).  Dr. Latanya Maudlin to do bilat thumb surgery when pt ready (as of 07/2018).  . Seizure disorder [facility]) 1977    Dr Mervyn Skeeters,  Neurology.One episode in 1977    Past Surgical History:  Procedure Laterality Date  . bladder tacking  1974  . CARDIAC CATHETERIZATION N/A 07/03/2015   No angiographic evidence of CAD, normal LV filling pressure and systolic fxn, no pulm HTN.  Procedure: Right/Left Heart Cath and Coronary Angiography;  Surgeon: Burnell Blanks, MD;  Location: [place] [facility];  Service: Cardiovascular;  Laterality: N/A;  . COLONOSCOPY  2003 & 2007   Dr Sharlett Iles  . TOTAL THYROIDECTOMY  10/2016   Path: benign multinodular goiter (Dr. Celine Ahr, Surg onc at [facility].  Marland Kitchen TRANSTHORACIC ECHOCARDIOGRAM  09/24/2015   Normal.  EF 60-65%.  Marland Kitchen uterine polypectomy  2008    Dr Nori Riis    Current Outpatient Medications  Medication Sig Dispense Refill  . ALPRAZolam (XANAX) 0.5 MG tablet Take 0.5 mg by mouth daily as needed for anxiety.   5  . amLODipine (NORVASC) 10 MG tablet Take 1 tablet (10 mg total) by mouth daily. Please make annual appt in April with Dr. Angelena Form for refills. Thank you 30 tablet 1  . atorvastatin (LIPITOR) 20 MG tablet TAKE 1 TABLET BY MOUTH DAILY 90 tablet 3  . Calcium-Magnesium-Vitamin D (CALCIUM 500 PO) Take 250 tablets by mouth.    . Cholecalciferol (VITAMIN D) 2000 units CAPS Take 2,000 Units by mouth daily.    . fluticasone (FLONASE) 50 MCG/ACT nasal spray SHAKE LIQUID AND USE  2  SPRAYS IN EACH NOSTRIL  EVERY DAY 48 g 3  . furosemide (LASIX) 20 MG tablet 1 tab po qd as needed for swelling of legs 15 tablet 0  . levothyroxine (SYNTHROID) 88 MCG tablet Take 1 tablet (88 mcg total) by mouth daily. Take 1 tablet 6 days a week and just 1/2 tablet 1 day a week; in AM on empty stomach for thyroid (name brand only DAW) (Patient taking differently: Take 88 mcg by mouth daily. Take 1 tablet daily.)    . meloxicam (MOBIC) 15 MG tablet Take 1 tablet (15 mg total) by mouth daily. 90 tablet 3  . Multiple Vitamin (MULTIVITAMIN) tablet Take 1 tablet by mouth daily.    Marland Kitchen nystatin-triamcinolone ointment  (MYCOLOG) APPLY TOPICALLY AS NEEDED FOR SKIN IRRITATION    . WAL-FEX ALLERGY 60 MG tablet 1/2 tab po bid for allergies 90 tablet 3  . nitroGLYCERIN (NITROSTAT) 0.4 MG SL tablet Place 1 tablet (0.4 mg total) under the tongue every 5 (five) minutes as needed for chest pain. (Patient not taking: Reported on 07/14/2019) 25 tablet 6   No current facility-administered medications for this visit.    Allergies  Allergen Reactions  . Achromycin [Tetracycline] Hives  . Other Rash    "Mycins" - hives    Social History   Socioeconomic History  . Marital status: Married    Spouse name: Not on file  . Number of children: 0  . Years of education: Not on file  . Highest education level: Not on file  Occupational History  . Occupation: Retired, works part-time now for [employer]: RETIRED  Tobacco Use  . Smoking status: Never Smoker  . Smokeless tobacco: Never Used  Substance and Sexual Activity  . Alcohol use: Yes    Alcohol/week: 1.0 standard drinks    Types: 1 Standard drinks or equivalent per week    Comment: Rarely  . Drug use: No  . Sexual activity: Not on file  Other Topics Concern  . Not on file  Social History Narrative   Married, husband is pt of mine Iara Kesling).   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     No tobacco.   Alc: 1 drink per week.  No drugs.         Social Determinants of Health   Financial Resource Strain:   . Difficulty of Paying Living Expenses:   Food Insecurity:   . Worried About Charity fundraiser in the Last Year:   . Arboriculturist in the Last Year:   Transportation Needs:   . Film/video editor (Medical):   Marland Kitchen Lack of Transportation (Non-Medical):   Physical Activity:   . Days of Exercise per Week:   . Minutes of Exercise per Session:   Stress:   . Feeling of Stress :   Social Connections:   . Frequency of Communication with Friends and Family:   . Frequency of Social Gatherings with Friends and Family:   . Attends  Religious Services:   . Active Member of Clubs or Organizations:   . Attends Archivist Meetings:   Marland Kitchen Marital Status:   Intimate Partner Violence:   . Fear of Current or Ex-Partner:   . Emotionally Abused:   Marland Kitchen Physically Abused:   . Sexually Abused:     Family History  Problem Relation Age of Onset  . Prostate cancer Father   . Heart attack Father 16  . Hypertension Mother   .  Thalassemia Mother   . Diverticulitis Brother   . Suicidality Brother   . Thalassemia Maternal Aunt   . Stroke Neg Hx   . Diabetes Neg Hx     Review of Systems:  As stated in the HPI and otherwise negative.   BP 130/70   Pulse (!) 59   Ht 5\' 2"  (1.575 m)   Wt 179 lb 12.8 oz (81.6 kg)   SpO2 98%   BMI 32.89 kg/m   Physical Examination: General: Well developed, well nourished, NAD  HEENT: OP clear, mucus membranes moist  SKIN: warm, dry. No rashes. Neuro: No focal deficits  Musculoskeletal: Muscle strength 5/5 all ext  Psychiatric: Mood and affect normal  Neck: No JVD, no carotid bruits, no thyromegaly, no lymphadenopathy.  Lungs:Clear bilaterally, no wheezes, rhonci, crackles Cardiovascular: Regular rate and rhythm. No murmurs, gallops or rubs. Abdomen:Soft. Bowel sounds present. Non-tender.  Extremities: No lower extremity edema. Pulses are 2 + in the bilateral DP/PT.  Cardiac cath 07/03/15: 1. No angiographic evidence of CAD 2. Normal LV systolic function.  3. Normal PA pressures. 4. Normal LV filling pressures.   Echo April 2017: Left ventricle: The cavity size was normal. Wall thickness was   normal. Systolic function was normal. The estimated ejection   fraction was in the range of 60% to 65%. Wall motion was normal;   there were no regional wall motion abnormalities. - Left atrium: The atrium was mildly dilated.   EKG:  EKG is ordered today. The ekg ordered today demonstrates sinus brady, poor R wave progression precordial leads.   Recent Labs: 08/11/2019: TSH  4.04 10/05/2019: ALT 18; BUN 18; Creatinine, Ser 0.89; Hemoglobin 11.8; Platelets 236.0; Potassium 4.9; Sodium 140   Lipid Panel    Component Value Date/Time   CHOL 161 10/05/2019 0920   TRIG 80.0 10/05/2019 0920   HDL 55.50 10/05/2019 0920   CHOLHDL 3 10/05/2019 0920   VLDL 16.0 10/05/2019 0920   LDLCALC 89 10/05/2019 0920   LDLDIRECT 144.3 02/17/2012 1540     Wt Readings from Last 3 Encounters:  10/14/19 179 lb 12.8 oz (81.6 kg)  10/05/19 180 lb 12.8 oz (82 kg)  07/14/19 181 lb (82.1 kg)     Other studies Reviewed: Additional studies/ records that were reviewed today include: . Review of the above records demonstrates:    Assessment and Plan:   1. Chest pain: No recurrence of chest pain. No evidence of CAD on cardiac cath in 2017  2. HTN: BP controlled. Continue current therapy. Renal artery dopplers ok.  Current medicines are reviewed at length with the patient today.  The patient does not have concerns regarding medicines.  The following changes have been made:  no change  Labs/ tests ordered today include: right and left heart cath  Orders Placed This Encounter  Procedures  . EKG 12-Lead    Disposition:   FU with me in 24 months   Signed, Lauree Chandler, MD 10/14/2019 10:04 AM    [facility] [address], [place],   [address] Phone: [phone]; Fax: [phone]

## 2019-10-14 NOTE — Patient Instructions (Signed)
Medication Instructions:  No changes *If you need a refill on your cardiac medications before your next appointment, please call your pharmacy*   Lab Work: None ordered  Testing/Procedures: None ordered  Follow-Up: At [facility], you and your health needs are our priority.  As part of our continuing mission to provide you with exceptional heart care, we have created designated Provider Care Teams.  These Care Teams include your primary Cardiologist (physician) and Advanced Practice Providers (APPs -  Physician Assistants and Nurse Practitioners) who all work together to provide you with the care you need, when you need it.  Your next appointment:   24 month(s)  The format for your next appointment:   In Person  Provider:   You may see Lauree Chandler, MD or one of the following Advanced Practice Providers on your designated Care Team:    Melina Copa, PA-C  Ermalinda Barrios, PA-C    Other Instructions

## 2019-10-17 DIAGNOSIS — M5432 Sciatica, left side: Secondary | ICD-10-CM | POA: Diagnosis not present

## 2019-10-17 DIAGNOSIS — M9903 Segmental and somatic dysfunction of lumbar region: Secondary | ICD-10-CM | POA: Diagnosis not present

## 2019-10-20 DIAGNOSIS — M5432 Sciatica, left side: Secondary | ICD-10-CM | POA: Diagnosis not present

## 2019-10-20 DIAGNOSIS — M9903 Segmental and somatic dysfunction of lumbar region: Secondary | ICD-10-CM | POA: Diagnosis not present

## 2019-10-24 DIAGNOSIS — M9903 Segmental and somatic dysfunction of lumbar region: Secondary | ICD-10-CM | POA: Diagnosis not present

## 2019-10-24 DIAGNOSIS — M5432 Sciatica, left side: Secondary | ICD-10-CM | POA: Diagnosis not present

## 2019-10-26 DIAGNOSIS — M5432 Sciatica, left side: Secondary | ICD-10-CM | POA: Diagnosis not present

## 2019-10-26 DIAGNOSIS — M9903 Segmental and somatic dysfunction of lumbar region: Secondary | ICD-10-CM | POA: Diagnosis not present

## 2019-10-31 DIAGNOSIS — M5432 Sciatica, left side: Secondary | ICD-10-CM | POA: Diagnosis not present

## 2019-10-31 DIAGNOSIS — M9903 Segmental and somatic dysfunction of lumbar region: Secondary | ICD-10-CM | POA: Diagnosis not present

## 2019-11-02 DIAGNOSIS — M9903 Segmental and somatic dysfunction of lumbar region: Secondary | ICD-10-CM | POA: Diagnosis not present

## 2019-11-02 DIAGNOSIS — M5432 Sciatica, left side: Secondary | ICD-10-CM | POA: Diagnosis not present

## 2019-11-07 DIAGNOSIS — M9903 Segmental and somatic dysfunction of lumbar region: Secondary | ICD-10-CM | POA: Diagnosis not present

## 2019-11-07 DIAGNOSIS — M5432 Sciatica, left side: Secondary | ICD-10-CM | POA: Diagnosis not present

## 2019-11-08 DIAGNOSIS — H524 Presbyopia: Secondary | ICD-10-CM | POA: Diagnosis not present

## 2019-11-08 DIAGNOSIS — H52202 Unspecified astigmatism, left eye: Secondary | ICD-10-CM | POA: Diagnosis not present

## 2019-11-08 DIAGNOSIS — H5212 Myopia, left eye: Secondary | ICD-10-CM | POA: Diagnosis not present

## 2019-11-08 DIAGNOSIS — Z961 Presence of intraocular lens: Secondary | ICD-10-CM | POA: Diagnosis not present

## 2019-11-10 DIAGNOSIS — M9903 Segmental and somatic dysfunction of lumbar region: Secondary | ICD-10-CM | POA: Diagnosis not present

## 2019-11-10 DIAGNOSIS — M5432 Sciatica, left side: Secondary | ICD-10-CM | POA: Diagnosis not present

## 2019-11-21 DIAGNOSIS — F5101 Primary insomnia: Secondary | ICD-10-CM | POA: Diagnosis not present

## 2019-11-21 DIAGNOSIS — M2550 Pain in unspecified joint: Secondary | ICD-10-CM | POA: Diagnosis not present

## 2019-11-21 DIAGNOSIS — R768 Other specified abnormal immunological findings in serum: Secondary | ICD-10-CM | POA: Diagnosis not present

## 2019-11-22 ENCOUNTER — Other Ambulatory Visit: Payer: Self-pay | Admitting: Cardiovascular Disease

## 2019-12-06 DIAGNOSIS — R768 Other specified abnormal immunological findings in serum: Secondary | ICD-10-CM | POA: Diagnosis not present

## 2019-12-12 DIAGNOSIS — R768 Other specified abnormal immunological findings in serum: Secondary | ICD-10-CM | POA: Diagnosis not present

## 2019-12-29 DIAGNOSIS — L814 Other melanin hyperpigmentation: Secondary | ICD-10-CM | POA: Diagnosis not present

## 2019-12-29 DIAGNOSIS — D229 Melanocytic nevi, unspecified: Secondary | ICD-10-CM | POA: Diagnosis not present

## 2019-12-29 DIAGNOSIS — C44311 Basal cell carcinoma of skin of nose: Secondary | ICD-10-CM | POA: Diagnosis not present

## 2019-12-29 DIAGNOSIS — R768 Other specified abnormal immunological findings in serum: Secondary | ICD-10-CM | POA: Diagnosis not present

## 2019-12-29 DIAGNOSIS — L821 Other seborrheic keratosis: Secondary | ICD-10-CM | POA: Diagnosis not present

## 2019-12-29 DIAGNOSIS — L820 Inflamed seborrheic keratosis: Secondary | ICD-10-CM | POA: Diagnosis not present

## 2020-01-10 DIAGNOSIS — M9903 Segmental and somatic dysfunction of lumbar region: Secondary | ICD-10-CM | POA: Diagnosis not present

## 2020-01-19 DIAGNOSIS — R768 Other specified abnormal immunological findings in serum: Secondary | ICD-10-CM | POA: Diagnosis not present

## 2020-02-01 DIAGNOSIS — C44311 Basal cell carcinoma of skin of nose: Secondary | ICD-10-CM | POA: Diagnosis not present

## 2020-02-02 DIAGNOSIS — R768 Other specified abnormal immunological findings in serum: Secondary | ICD-10-CM | POA: Diagnosis not present

## 2020-02-08 DIAGNOSIS — M9903 Segmental and somatic dysfunction of lumbar region: Secondary | ICD-10-CM | POA: Diagnosis not present

## 2020-02-15 DIAGNOSIS — E890 Postprocedural hypothyroidism: Secondary | ICD-10-CM | POA: Diagnosis not present

## 2020-02-17 ENCOUNTER — Other Ambulatory Visit: Payer: Self-pay | Admitting: Family Medicine

## 2020-02-17 DIAGNOSIS — E890 Postprocedural hypothyroidism: Secondary | ICD-10-CM | POA: Diagnosis not present

## 2020-02-24 ENCOUNTER — Other Ambulatory Visit: Payer: Self-pay | Admitting: Family Medicine

## 2020-03-19 DIAGNOSIS — M9903 Segmental and somatic dysfunction of lumbar region: Secondary | ICD-10-CM | POA: Diagnosis not present

## 2020-04-03 ENCOUNTER — Ambulatory Visit: Payer: Medicare Other | Admitting: Family Medicine

## 2020-04-05 ENCOUNTER — Ambulatory Visit: Payer: Medicare Other | Admitting: Family Medicine

## 2020-04-11 ENCOUNTER — Ambulatory Visit (INDEPENDENT_AMBULATORY_CARE_PROVIDER_SITE_OTHER): Payer: Medicare Other | Admitting: Family Medicine

## 2020-04-11 ENCOUNTER — Other Ambulatory Visit: Payer: Self-pay

## 2020-04-11 ENCOUNTER — Encounter: Payer: Self-pay | Admitting: Family Medicine

## 2020-04-11 VITALS — BP 128/70 | HR 49 | Temp 98.1°F | Resp 16 | Ht 62.0 in | Wt 167.0 lb

## 2020-04-11 DIAGNOSIS — F419 Anxiety disorder, unspecified: Secondary | ICD-10-CM

## 2020-04-11 DIAGNOSIS — I10 Essential (primary) hypertension: Secondary | ICD-10-CM | POA: Diagnosis not present

## 2020-04-11 DIAGNOSIS — M150 Primary generalized (osteo)arthritis: Secondary | ICD-10-CM

## 2020-04-11 DIAGNOSIS — R1032 Left lower quadrant pain: Secondary | ICD-10-CM

## 2020-04-11 DIAGNOSIS — M8949 Other hypertrophic osteoarthropathy, multiple sites: Secondary | ICD-10-CM | POA: Diagnosis not present

## 2020-04-11 DIAGNOSIS — R0989 Other specified symptoms and signs involving the circulatory and respiratory systems: Secondary | ICD-10-CM | POA: Diagnosis not present

## 2020-04-11 DIAGNOSIS — E7800 Pure hypercholesterolemia, unspecified: Secondary | ICD-10-CM

## 2020-04-11 DIAGNOSIS — M159 Polyosteoarthritis, unspecified: Secondary | ICD-10-CM

## 2020-04-11 HISTORY — DX: Other specified symptoms and signs involving the circulatory and respiratory systems: R09.89

## 2020-04-11 LAB — LIPID PANEL
Cholesterol: 160 mg/dL (ref 0–200)
HDL: 54.5 mg/dL (ref >39.00)
LDL Cholesterol: 89 mg/dL (ref 0–99)
NonHDL: 105.09
Total CHOL/HDL Ratio: 3
Triglycerides: 80 mg/dL (ref 0.0–149.0)
VLDL: 16 mg/dL (ref 0.0–40.0)

## 2020-04-11 LAB — COMPREHENSIVE METABOLIC PANEL
ALT: 21 U/L (ref 0–35)
AST: 31 U/L (ref 0–37)
Albumin: 4 g/dL (ref 3.5–5.2)
Alkaline Phosphatase: 77 U/L (ref 39–117)
BUN: 17 mg/dL (ref 6–23)
CO2: 28 mEq/L (ref 19–32)
Calcium: 9.1 mg/dL (ref 8.4–10.5)
Chloride: 106 mEq/L (ref 96–112)
Creatinine, Ser: 0.94 mg/dL (ref 0.40–1.20)
GFR: 61.06 mL/min (ref >60.00)
Glucose, Bld: 82 mg/dL (ref 70–99)
Potassium: 4.4 mEq/L (ref 3.5–5.1)
Sodium: 141 mEq/L (ref 135–145)
Total Bilirubin: 0.5 mg/dL (ref 0.2–1.2)
Total Protein: 6.4 g/dL (ref 6.0–8.3)

## 2020-04-11 NOTE — Progress Notes (Signed)
OFFICE VISIT  04/11/2020  CC:  Chief Complaint  Patient presents with  . Follow-up    RCI, pt is fasting    HPI:    Patient is a 69 y.o. Caucasian female who presents for 6 mo f/u HTN, HLD, anxiety, and osteoarthritis of multiple sites (bilat hands + wrists+ R knee. She is followed by endo for postsurg hypothyroidism.  A/P as of last visit: "Health maintenance exam: Reviewed age and gender appropriate health maintenance issues (prudent diet, regular exercise, health risks of tobacco and excessive alcohol, use of seatbelts, fire alarms in home, use of sunscreen).  Also reviewed age and gender appropriate health screening as well as vaccine recommendations. Vaccines:  All UTD.  Covid 19->she has had this vaccine. Labs: TSH and A1c UTD.  FLP, CMET, CBC ordered (HLD, HTN). Cervical ca screening: Dr. Hipolito Bayley smear done 07/21/18-->all ok per pt. Colon ca screening:last colonoscopy was 2007 by Dr. Sharlett Iles. She had a cologuard that was neg in 04/2016. Normal FIT 07/2019 via insurer's home visit.  Rpt FIT 1 yr. Breast ca screening: last mammogram2/10/2018-->normal-->pt to arrange. Osteoporosis screening: DEXA 10/2015 T-score -2.0 ([facility])->done 2020 at [facility]->no change.  Osteoarthritis: multiple sites.  I'll continue her on meloxicam 15mg  qd prn that her orthopedist had started her on last year."  INTERIM HX: Doing well. Home bp's normal. Working on losing wt: exercising well and dietary changes. Has lost 12 lbs in last 6 mo.  HLD: tolerating statin.  Hx of intermittent LE: venous insuff-->using lasix rarely.  Anxiety: rarely takes alprazolam 1/2 tab at a time.  Hands osteoarthritis: daily meloxicam helps her pain and stiffness. DIPS and PIPs.    PMP AWARE reviewed today: Nothing listed--pt says last fill was about 2 yrs ago.  2 day hx of "nagging" intermittent LLQ pain, no radiation.  Bowel habits unchanged, no hematochezia or melena.  No urinary  complaints.  NO fevers.  No n/v.  No known trigger or alleviating factor.  ROS: no fevers, no CP, no SOB, no wheezing, no cough, no dizziness, no HAs, no rashes, no melena/hematochezia.  No polyuria or polydipsia.  No myalgias or arthralgias.  No focal weakness, paresthesias, or tremors.  No acute vision or hearing abnormalities. No n/v/d.  No palpitations.     Past Medical History:  Diagnosis Date  . Allergic rhinitis   . Colon cancer screening 07/2019   FIT test NEG 07/24/19. Rpt 1 yr  . GERD (gastroesophageal reflux disease)   . History of thyroidectomy, total    Total thyroidectomy 10/29/16, [facility], surgery done due to compressive sx's  . Hx of cardiovascular stress test 04/09/2012   a. ETT + for ischemia;  b. ETT-MV: (2013) images with no ischemia, EF  77%; +ECG changes c/w ischemia.  Cath and echo normal 2017.  Marland Kitchen Hyperlipidemia    Great response to statin 2020  . Hypertension   . Hypothyroidism, postsurgical 10/2016   Dr. Elyse Hsu follows  . IBS (irritable bowel syndrome)   . Osteoarthritis of both hands 2016   Both hands, wrists, R knee.  Rheum eval 11/2014 NEG (Dr. Trudie Reed).  Dr. Latanya Maudlin to do bilat thumb surgery when pt ready (as of 07/2018).  . Seizure disorder [facility]) 1977    Dr Mervyn Skeeters, Neurology.One episode in 1977    Past Surgical History:  Procedure Laterality Date  . bladder tacking  1974  . CARDIAC CATHETERIZATION N/A 07/03/2015   No angiographic evidence of CAD, normal LV filling pressure and systolic fxn, no pulm  HTN.  Procedure: Right/Left Heart Cath and Coronary Angiography;  Surgeon: Burnell Blanks, MD;  Location: [place] [facility];  Service: Cardiovascular;  Laterality: N/A;  . COLONOSCOPY  2003 & 2007   Dr Sharlett Iles  . TOTAL THYROIDECTOMY  10/2016   Path: benign multinodular goiter (Dr. Celine Ahr, Surg onc at [facility].  Marland Kitchen TRANSTHORACIC ECHOCARDIOGRAM  09/24/2015   Normal.  EF 60-65%.  Marland Kitchen uterine polypectomy  2008    Dr Nori Riis    Outpatient Medications Prior to  Visit  Medication Sig Dispense Refill  . ALPRAZolam (XANAX) 0.5 MG tablet Take 0.5 mg by mouth daily as needed for anxiety.   5  . amLODipine (NORVASC) 10 MG tablet TAKE 1 TABLET BY MOUTH  DAILY 90 tablet 3  . atorvastatin (LIPITOR) 20 MG tablet TAKE 1 TABLET BY MOUTH DAILY 90 tablet 1  . Calcium-Magnesium-Vitamin D (CALCIUM 500 PO) Take 250 tablets by mouth.    . Cholecalciferol (VITAMIN D) 2000 units CAPS Take 2,000 Units by mouth daily.    . fluticasone (FLONASE) 50 MCG/ACT nasal spray SHAKE LIQUID AND USE 2  SPRAYS IN EACH NOSTRIL  EVERY DAY 48 g 3  . furosemide (LASIX) 20 MG tablet 1 tab po qd as needed for swelling of legs 15 tablet 0  . levothyroxine (SYNTHROID) 88 MCG tablet Take 1 tablet (88 mcg total) by mouth daily. Take 1 tablet 6 days a week and just 1/2 tablet 1 day a week; in AM on empty stomach for thyroid (name brand only DAW) (Patient taking differently: Take 88 mcg by mouth daily. Take 1 tablet daily.)    . meloxicam (MOBIC) 15 MG tablet Take 1 tablet (15 mg total) by mouth daily. 90 tablet 3  . Multiple Vitamin (MULTIVITAMIN) tablet Take 1 tablet by mouth daily.    Marland Kitchen nystatin-triamcinolone ointment (MYCOLOG) APPLY TOPICALLY AS NEEDED FOR SKIN IRRITATION    . WAL-FEX ALLERGY 60 MG tablet 1/2 tab po bid for allergies 90 tablet 3  . nitroGLYCERIN (NITROSTAT) 0.4 MG SL tablet Place 1 tablet (0.4 mg total) under the tongue every 5 (five) minutes as needed for chest pain. (Patient not taking: Reported on 07/14/2019) 25 tablet 6   No facility-administered medications prior to visit.    Allergies  Allergen Reactions  . Achromycin [Tetracycline] Hives  . Other Rash    "Mycins" - hives    ROS As per HPI  PE: Vitals with BMI 04/11/2020 10/14/2019 10/05/2019  Height 5\' 2"  5\' 2"  5\' 2"   Weight 167 lbs 179 lbs 13 oz 180 lbs 13 oz  BMI 30.54 15.40 08.67  Systolic 619 509 326  Diastolic 70 70 78  Pulse 49 59 54    Exam chaperoned by Deveron Furlong, CMA.  Gen: Alert, well  appearing.  Patient is oriented to person, place, time, and situation. AFFECT: pleasant, lucid thought and speech. CV: RRR, soft syst murmur in AV area, no diastolic murmur, no r/g. Laryngoscopy with fiberoptic scope: excellent visualization of the hypopharynx, supraglottic and laryngeal area including cords is obtained; this is normal, both cords move to midline.  No masses seen. ABD: soft, NT, ND, BS normal.  No hepatospenomegaly or mass.  Prominent aortic pulsation + soft bruit audible. EXT: no clubbing or cyanosis.  no edema.     LABS:  Lab Results  Component Value Date   TSH 4.04 08/11/2019   Lab Results  Component Value Date   WBC 5.8 10/05/2019   HGB 11.8 (L) 10/05/2019   HCT 35.5 (  L) 10/05/2019   MCV 86.9 10/05/2019   PLT 236.0 10/05/2019   Lab Results  Component Value Date   IRON 60 05/01/2008   Lab Results  Component Value Date   VITAMINB12 451 07/05/2010   Lab Results  Component Value Date   CREATININE 0.89 10/05/2019   BUN 18 10/05/2019   NA 140 10/05/2019   K 4.9 10/05/2019   CL 105 10/05/2019   CO2 28 10/05/2019   Lab Results  Component Value Date   ALT 18 10/05/2019   AST 27 10/05/2019   ALKPHOS 80 10/05/2019   BILITOT 0.6 10/05/2019   Lab Results  Component Value Date   CHOL 161 10/05/2019   Lab Results  Component Value Date   HDL 55.50 10/05/2019   Lab Results  Component Value Date   LDLCALC 89 10/05/2019   Lab Results  Component Value Date   TRIG 80.0 10/05/2019   Lab Results  Component Value Date   CHOLHDL 3 10/05/2019   Lab Results  Component Value Date   HGBA1C 5.3 07/21/2019    IMPRESSION AND PLAN:  1) LLQ pain: no worrisome features on hx or exam today. Doubt related to her abd bruit/prominent aortic pulsation. Obs.  2) Aortic bruit w/prominent aortic pulsation: check for AAA with US aorta--ordered.  3) HTN: Stable.  Continue amlodipine 10mg  qd. Lytes/cr today.  4) HLD; tolerating statin--continue this. Lipids  panel and liver panel today.  5) Osteoarth bilat hands: helped well by daily meloxicam. Renal fxn monitoring today.  6) anxiety: RARE use of alprazolam. No new rx needed for this med today.  7) Hypoth: followed by endo.  An After Visit Summary was printed and given to the patient.  FOLLOW UP: Return in about 6 months (around 10/10/2020) for annual CPE (fasting).  Signed:  Crissie Sickles, MD           04/11/2020

## 2020-04-16 DIAGNOSIS — R0989 Other specified symptoms and signs involving the circulatory and respiratory systems: Secondary | ICD-10-CM

## 2020-04-16 HISTORY — DX: Other specified symptoms and signs involving the circulatory and respiratory systems: R09.89

## 2020-04-17 ENCOUNTER — Ambulatory Visit (HOSPITAL_BASED_OUTPATIENT_CLINIC_OR_DEPARTMENT_OTHER)
Admission: RE | Admit: 2020-04-17 | Discharge: 2020-04-17 | Disposition: A | Discharge status: Home / Self Care | Payer: Medicare Other | Source: Ambulatory Visit | Attending: Family Medicine | Admitting: Family Medicine

## 2020-04-17 ENCOUNTER — Other Ambulatory Visit: Payer: Self-pay

## 2020-04-17 DIAGNOSIS — Z136 Encounter for screening for cardiovascular disorders: Secondary | ICD-10-CM | POA: Diagnosis not present

## 2020-04-17 DIAGNOSIS — R0989 Other specified symptoms and signs involving the circulatory and respiratory systems: Secondary | ICD-10-CM | POA: Insufficient documentation

## 2020-04-17 DIAGNOSIS — I700 Atherosclerosis of aorta: Secondary | ICD-10-CM | POA: Insufficient documentation

## 2020-04-18 ENCOUNTER — Encounter: Payer: Self-pay | Admitting: Family Medicine

## 2020-05-02 ENCOUNTER — Ambulatory Visit (INDEPENDENT_AMBULATORY_CARE_PROVIDER_SITE_OTHER): Payer: Medicare Other

## 2020-05-02 VITALS — Ht 62.0 in | Wt 165.0 lb

## 2020-05-02 DIAGNOSIS — Z1159 Encounter for screening for other viral diseases: Secondary | ICD-10-CM | POA: Diagnosis not present

## 2020-05-02 DIAGNOSIS — Z0000 Encounter for general adult medical examination without abnormal findings: Secondary | ICD-10-CM

## 2020-05-02 NOTE — Patient Instructions (Signed)
Renee Lyons , Thank you for taking time to complete  your Medicare Wellness Visit. I appreciate your ongoing commitment to your health goals. Please review the following plan we discussed and let me know if I can assist you in the future.   Screening recommendations/referrals: Colonoscopy: Due- Declined at this time. Please call the office to schedule if you change your mind. Mammogram: Per our conversation, scheduled for 06/19/2020. Bone Density: Due- Discuss with GYN Recommended yearly ophthalmology/optometry visit for glaucoma screening and checkup Recommended yearly dental visit for hygiene and checkup  Vaccinations: Influenza vaccine: Up to date Pneumococcal vaccine: Completed vaccines Tdap vaccine: Up to date- Due-02/16/2022 Shingles vaccine: Completed vaccines  Covid-19:Completed vaccines  Advanced directives: Please bring a copy for your chart  Conditions/risks identified: See problem list  Next appointment: Follow up in one year for your annual wellness visit 05/15/21 @ 12:45   Preventive Care 71 Years and Older, Female Preventive care refers to lifestyle choices and visits with your health care provider that can promote health and wellness. What does preventive care include?  A yearly physical exam. This is also called an annual well check.  Dental exams once or twice a year.  Routine eye exams. Ask your health care provider how often you should have your eyes checked.  Personal lifestyle choices, including:  Daily care of your teeth and gums.  Regular physical activity.  Eating a healthy diet.  Avoiding tobacco and drug use.  Limiting alcohol use.  Practicing safe sex.  Taking low-dose aspirin every day.  Taking vitamin and mineral supplements as recommended by your health care provider. What happens during an annual well check? The services and screenings done by your health care provider during your annual well check will depend on your age, overall health,  lifestyle risk factors, and family history of disease. Counseling  Your health care provider may ask you questions about your:  Alcohol use.  Tobacco use.  Drug use.  Emotional well-being.  Home and relationship well-being.  Sexual activity.  Eating habits.  History of falls.  Memory and ability to understand (cognition).  Work and work Statistician.  Reproductive health. Screening  You may have the following tests or measurements:  Height, weight, and BMI.  Blood pressure.  Lipid and cholesterol levels. These may be checked every 5 years, or more frequently if you are over 33 years old.  Skin check.  Lung cancer screening. You may have this screening every year starting at age 71 if you have a 30-pack-year history of smoking and currently smoke or have quit within the past 15 years.  Fecal occult blood test (FOBT) of the stool. You may have this test every year starting at age 71.  Flexible sigmoidoscopy or colonoscopy. You may have a sigmoidoscopy every 5 years or a colonoscopy every 10 years starting at age 41.  Hepatitis C blood test.  Hepatitis B blood test.  Sexually transmitted disease (STD) testing.  Diabetes screening. This is done by checking your blood sugar (glucose) after you have not eaten for a while (fasting). You may have this done every 1-3 years.  Bone density scan. This is done to screen for osteoporosis. You may have this done starting at age 71.  Mammogram. This may be done every 1-2 years. Talk to your health care provider about how often you should have regular mammograms. Talk with your health care provider about your test results, treatment options, and if necessary, the need for more tests. Vaccines  Your health  care provider may recommend certain vaccines, such as:  Influenza vaccine. This is recommended every year.  Tetanus, diphtheria, and acellular pertussis (Tdap, Td) vaccine. You may need a Td booster every 10 years.  Zoster  vaccine. You may need this after age 71.  Pneumococcal 13-valent conjugate (PCV13) vaccine. One dose is recommended after age 71.  Pneumococcal polysaccharide (PPSV23) vaccine. One dose is recommended after age 71. Talk to your health care provider about which screenings and vaccines you need and how often you need them. This information is not intended to replace advice given to you by your health care provider. Make sure you discuss any questions you have with your health care provider. Document Released: 06/29/2015 Document Revised: 02/20/2016 Document Reviewed: 04/03/2015 Elsevier Interactive Patient Education  2017 [place] Prevention in the Home Falls can cause injuries. They can happen to people of all ages. There are many things you can do to make your home safe and to help prevent falls. What can I do on the outside of my home?  Regularly fix the edges of walkways and driveways and fix any cracks.  Remove anything that might make you trip as you walk through a door, such as a raised step or threshold.  Trim any bushes or trees on the path to your home.  Use bright outdoor lighting.  Clear any walking paths of anything that might make someone trip, such as rocks or tools.  Regularly check to see if handrails are loose or broken. Make sure that both sides of any steps have handrails.  Any raised decks and porches should have guardrails on the edges.  Have any leaves, snow, or ice cleared regularly.  Use sand or salt on walking paths during winter.  Clean up any spills in your garage right away. This includes oil or grease spills. What can I do in the bathroom?  Use night lights.  Install grab bars by the toilet and in the tub and shower. Do not use towel bars as grab bars.  Use non-skid mats or decals in the tub or shower.  If you need to sit down in the shower, use a plastic, non-slip stool.  Keep the floor dry. Clean up any water that spills on the  floor as soon as it happens.  Remove soap buildup in the tub or shower regularly.  Attach bath mats securely with double-sided non-slip rug tape.  Do not have throw rugs and other things on the floor that can make you trip. What can I do in the bedroom?  Use night lights.  Make sure that you have a light by your bed that is easy to reach.  Do not use any sheets or blankets that are too big for your bed. They should not hang down onto the floor.  Have a firm chair that has side arms. You can use this for support while you get dressed.  Do not have throw rugs and other things on the floor that can make you trip. What can I do in the kitchen?  Clean up any spills right away.  Avoid walking on wet floors.  Keep items that you use a lot in easy-to-reach places.  If you need to reach something above you, use a strong step stool that has a grab bar.  Keep electrical cords out of the way.  Do not use floor polish or wax that makes floors slippery. If you must use wax, use non-skid floor wax.  Do not have  throw rugs and other things on the floor that can make you trip. What can I do with my stairs?  Do not leave any items on the stairs.  Make sure that there are handrails on both sides of the stairs and use them. Fix handrails that are broken or loose. Make sure that handrails are as long as the stairways.  Check any carpeting to make sure that it is firmly attached to the stairs. Fix any carpet that is loose or worn.  Avoid having throw rugs at the top or bottom of the stairs. If you do have throw rugs, attach them to the floor with carpet tape.  Make sure that you have a light switch at the top of the stairs and the bottom of the stairs. If you do not have them, ask someone to add them for you. What else can I do to help prevent falls?  Wear shoes that:  Do not have high heels.  Have rubber bottoms.  Are comfortable and fit you well.  Are closed at the toe. Do not wear  sandals.  If you use a stepladder:  Make sure that it is fully opened. Do not climb a closed stepladder.  Make sure that both sides of the stepladder are locked into place.  Ask someone to hold it for you, if possible.  Clearly mark and make sure that you can see:  Any grab bars or handrails.  First and last steps.  Where the edge of each step is.  Use tools that help you move around (mobility aids) if they are needed. These include:  Canes.  Walkers.  Scooters.  Crutches.  Turn on the lights when you go into a dark area. Replace any light bulbs as soon as they burn out.  Set up your furniture so you have a clear path. Avoid moving your furniture around.  If any of your floors are uneven, fix them.  If there are any pets around you, be aware of where they are.  Review your medicines with your doctor. Some medicines can make you feel dizzy. This can increase your chance of falling. Ask your doctor what other things that you can do to help prevent falls. This information is not intended to replace advice given to you by your health care provider. Make sure you discuss any questions you have with your health care provider. Document Released: 03/29/2009 Document Revised: 11/08/2015 Document Reviewed: 07/07/2014 Elsevier Interactive Patient Education  2017 [employer].

## 2020-05-02 NOTE — Progress Notes (Signed)
Subjective:   Renee Lyons is a 68 y.o. female who presents for Medicare Annual (Subsequent) preventive examination.  I connected with Pricilla Holm today by telephone and verified that I am speaking with the correct person using two identifiers. Location patient: home Location provider: work Persons participating in the virtual visit: patient, Marine scientist.    I discussed the limitations, risks, security and privacy concerns of performing an evaluation and management service by telephone and the availability of in person appointments. I also discussed with the patient that there may be a patient responsible charge related to this service. The patient expressed understanding and verbally consented to this telephonic visit.    Interactive audio and video telecommunications were attempted between this provider and patient, however failed, due to patient having technical difficulties OR patient did not have access to video capability.  We continued and completed visit with audio only.  Some vital signs may be absent or patient reported.   Time Spent with patient on telephone encounter: 20 minutes  Review of Systems     Cardiac Risk Factors include: advanced age (>59men, >38 women);dyslipidemia;obesity (BMI >30kg/m2)     Objective:    Today's Vitals   05/02/20 1300  Weight: 165 lb (74.8 kg)  Height: 5\' 2"  (1.575 m)   Body mass index is 30.18 kg/m.  Advanced Directives 05/02/2020 04/23/2018 04/06/2017 04/04/2016 03/31/2016 07/03/2015  Does Patient Have a Medical Advance Directive? Yes Yes Yes Yes No Yes  Type of Paramedic of [place];Living will Living will;Healthcare Power of [place];Living will [place];Living will - [place];Living will  Does patient want to make changes to medical advance directive? - - - No - Patient declined - -  Copy of [place] in Chart? No - copy requested No -  copy requested No - copy requested No - copy requested - No - copy requested    Current Medications (verified) Outpatient Encounter Medications as of 05/02/2020  Medication Sig  . ALPRAZolam (XANAX) 0.5 MG tablet Take 0.5 mg by mouth daily as needed for anxiety.   Marland Kitchen amLODipine (NORVASC) 10 MG tablet TAKE 1 TABLET BY MOUTH  DAILY  . atorvastatin (LIPITOR) 20 MG tablet TAKE 1 TABLET BY MOUTH DAILY  . Calcium-Magnesium-Vitamin D (CALCIUM 500 PO) Take 250 tablets by mouth.  . Cholecalciferol (VITAMIN D) 2000 units CAPS Take 2,000 Units by mouth daily.  . fluticasone (FLONASE) 50 MCG/ACT nasal spray SHAKE LIQUID AND USE 2  SPRAYS IN EACH NOSTRIL  EVERY DAY  . furosemide (LASIX) 20 MG tablet 1 tab po qd as needed for swelling of legs  . levothyroxine (SYNTHROID) 88 MCG tablet Take 1 tablet (88 mcg total) by mouth daily. Take 1 tablet 6 days a week and just 1/2 tablet 1 day a week; in AM on empty stomach for thyroid (name brand only DAW) (Patient taking differently: Take 88 mcg by mouth daily. Take 1 tablet daily.)  . meloxicam (MOBIC) 15 MG tablet Take 1 tablet (15 mg total) by mouth daily.  . Multiple Vitamin (MULTIVITAMIN) tablet Take 1 tablet by mouth daily.  Marland Kitchen nystatin-triamcinolone ointment (MYCOLOG) APPLY TOPICALLY AS NEEDED FOR SKIN IRRITATION  . WAL-FEX ALLERGY 60 MG tablet 1/2 tab po bid for allergies  . nitroGLYCERIN (NITROSTAT) 0.4 MG SL tablet Place 1 tablet (0.4 mg total) under the tongue every 5 (five) minutes as needed for chest pain. (Patient not taking: Reported on 07/14/2019)   No  facility-administered encounter medications on file as of 05/02/2020.    Allergies (verified) Achromycin [tetracycline] and Other   History: Past Medical History:  Diagnosis Date  . Abdominal bruit 04/2020   US aorta 04/2020 NO ANEURISM  . Allergic rhinitis   . Colon cancer screening 07/2019   FIT test NEG 07/24/19. Rpt 1 yr  . GERD (gastroesophageal reflux disease)   . Heart murmur, systolic     aortic valve area, soft, no worrisome features.  . History of thyroidectomy, total    Total thyroidectomy 10/29/16, [facility], surgery done due to compressive sx's  . Hx of cardiovascular stress test 04/09/2012   a. ETT + for ischemia;  b. ETT-MV: (2013) images with no ischemia, EF  77%; +ECG changes c/w ischemia.  Cath and echo normal 2017.  Marland Kitchen Hyperlipidemia    Great response to statin 2020  . Hypertension   . Hypothyroidism, postsurgical 10/2016   Dr. Elyse Hsu follows  . IBS (irritable bowel syndrome)   . Osteoarthritis of both hands 2016   Both hands, wrists, R knee.  Rheum eval 11/2014 NEG (Dr. Trudie Reed).  Dr. Latanya Maudlin to do bilat thumb surgery when pt ready (as of 07/2018).  . Prominent abdominal aortic pulsation 04/11/2020   + aortic bruit->to get u/s aorta 03/2020  . Seizure disorder [facility]) [facility]    Dr Mervyn Skeeters, Neurology.One episode in 1977   Past Surgical History:  Procedure Laterality Date  . bladder tacking  1974  . CARDIAC CATHETERIZATION N/A 07/03/2015   No angiographic evidence of CAD, normal LV filling pressure and systolic fxn, no pulm HTN.  Procedure: Right/Left Heart Cath and Coronary Angiography;  Surgeon: Burnell Blanks, MD;  Location: [facility];  Service: Cardiovascular;  Laterality: N/A;  . COLONOSCOPY  2003 & 2007   Dr Sharlett Iles  . TOTAL THYROIDECTOMY  10/2016   Path: benign multinodular goiter (Dr. Celine Ahr, Surg onc at [facility]  Marland Kitchen TRANSTHORACIC ECHOCARDIOGRAM  09/24/2015   Normal.  EF 60-65%.  Marland Kitchen uterine polypectomy  2008    Dr Nori Riis   Family History  Problem Relation Age of Onset  . Prostate cancer Father   . Heart attack Father 34  . Hypertension Mother   . Thalassemia Mother   . Diverticulitis Brother   . Suicidality Brother   . Thalassemia Maternal Aunt   . Stroke Neg Hx   . Diabetes Neg Hx    Social History   Socioeconomic History  . Marital status: Married    Spouse name: Not on file  . Number of children: 0  . Years of education: Not on file   . Highest education level: Not on file  Occupational History  . Occupation: Retired, works part-time now for [employer]: RETIRED  Tobacco Use  . Smoking status: Never Smoker  . Smokeless tobacco: Never Used  Vaping Use  . Vaping Use: Never used  Substance and Sexual Activity  . Alcohol use: Yes    Alcohol/week: 1.0 standard drink    Types: 1 Standard drinks or equivalent per week    Comment: Rarely  . Drug use: No  . Sexual activity: Not on file  Other Topics Concern  . Not on file  Social History Narrative   Married, husband is pt of mine Kamiah Fite).   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     No tobacco.   Alc: 1 drink per week.  No drugs.         Social Determinants of Health  Financial Resource Strain: Low Risk   . Difficulty of Paying Living Expenses: Not hard at all  Food Insecurity: No Food Insecurity  . Worried About Charity fundraiser in the Last Year: Never true  . Ran Out of Food in the Last Year: Never true  Transportation Needs: No Transportation Needs  . Lack of Transportation (Medical): No  . Lack of Transportation (Non-Medical): No  Physical Activity: Sufficiently Active  . Days of Exercise per Week: 4 days  . Minutes of Exercise per Session: 40 min  Stress: No Stress Concern Present  . Feeling of Stress : Not at all  Social Connections: Socially Integrated  . Frequency of Communication with Friends and Family: More than three times a week  . Frequency of Social Gatherings with Friends and Family: Once a week  . Attends Religious Services: More than 4 times per year  . Active Member of Clubs or Organizations: Yes  . Attends Archivist Meetings: More than 4 times per year  . Marital Status: Married    Tobacco Counseling Counseling given: Not Answered   Clinical Intake:  Pre-visit preparation completed: Yes  Pain : No/denies pain     Nutritional Status: BMI > 30  Obese Nutritional Risks: None Diabetes: No  How  often do you need to have someone help you when you read instructions, pamphlets, or other written materials from your doctor or pharmacy?: 1 - Never What is the last grade level you completed in school?: some college  Diabetic?No  Interpreter Needed?: No  Information entered by :: Caroleen Hamman LPN   Activities of Daily Living In your present state of health, do you have any difficulty performing the following activities: 05/02/2020  Hearing? N  Vision? N  Difficulty concentrating or making decisions? N  Walking or climbing stairs? N  Dressing or bathing? N  Doing errands, shopping? N  Preparing Food and eating ? N  Using the Toilet? N  In the past six months, have you accidently leaked urine? N  Do you have problems with loss of bowel control? N  Managing your Medications? N  Managing your Finances? N  Housekeeping or managing your Housekeeping? N  Some recent data might be hidden    Patient Care Team: [name], MD as PCP - General (Family Medicine) Burnell Blanks, MD as PCP - Cardiology (Cardiology) Maisie Fus, MD as Consulting Physician (Obstetrics and Gynecology) Rutherford Guys, MD as Consulting Physician (Ophthalmology) Randol Kern (Dentistry) [place], Legrand Como, MD as Consulting Physician (Endocrinology) Fredirick Maudlin, MD as Consulting Physician (General Surgery) Loney Loh, MD (Dermatology) Melrose Nakayama, MD as Consulting Physician (Orthopedic Surgery)  Indicate any recent Medical Services you may have received from other than [facility] providers in the past year (date may be approximate).     Assessment:   This is a routine wellness examination for Myeisha.  Hearing/Vision screen  Hearing Screening   125Hz  250Hz  500Hz  1000Hz  2000Hz  3000Hz  4000Hz  6000Hz  8000Hz   Right ear:           Left ear:           Comments: No issues  Vision Screening Comments: Last eye exam-2020- Dr. Gershon Crane  Dietary issues and exercise activities  discussed: Current Exercise Habits: Home exercise routine, Type of exercise: walking;strength training/weights, Time (Minutes): 60, Frequency (Times/Week): 3, Weekly Exercise (Minutes/Week): 180, Intensity: Mild, Exercise limited by: None identified  Goals    . Weight (lb) < 160 lb (72.6 kg)     Lose  weight by continuing to stay active.       Depression Screen PHQ 2/9 Scores 05/02/2020 10/05/2019 04/23/2018 04/06/2017 04/04/2016  PHQ - 2 Score 0 1 0 0 0    Fall Risk Fall Risk  05/02/2020 10/05/2019 04/23/2018 04/06/2017 04/04/2016  Falls in the past year? 1 0 1 No No  Comment - - fell in trash can - -  Number falls in past yr: 0 0 0 - -  Injury with Fall? 0 0 0 - -  Follow up Falls prevention discussed Falls evaluation completed - - -    Any stairs in or around the home? No  Home free of loose throw rugs in walkways, pet beds, electrical cords, etc? Yes  Adequate lighting in your home to reduce risk of falls? Yes   ASSISTIVE DEVICES UTILIZED TO PREVENT FALLS:  Life alert? No  Use of a cane, walker or w/c? No  Grab bars in the bathroom? Yes  Shower chair or bench in shower? No  Elevated toilet seat or a handicapped toilet? No   TIMED UP AND GO:  Was the test performed? No . Phone visit   Cognitive Function:No cognitive impairment noted MMSE - Mini Mental State Exam 04/23/2018  Orientation to time 5  Orientation to Place 5  Registration 3  Attention/ Calculation 3  Recall 1  Language- name 2 objects 2  Language- repeat 1  Language- follow 3 step command 3  Language- read & follow direction 1  Write a sentence 1  Copy design 1  Total score 26        Immunizations Immunization History  Administered Date(s) Administered  . Influenza, High Dose Seasonal PF 04/04/2016, 02/15/2017, 02/05/2019  . Influenza,inj,Quad PF,6+ Mos 03/25/2013, 03/22/2014  . Influenza-Unspecified 03/24/2015, 03/04/2018, 03/13/2020  . Moderna SARS-COVID-2 Vaccination 07/07/2019, 08/12/2019,  04/18/2020  . Pneumococcal Conjugate-13 02/03/2014  . Pneumococcal Polysaccharide-23 04/04/2016  . Tdap 02/17/2012  . Zoster 12/03/2011  . Zoster Recombinat (Shingrix) 03/16/2017, 07/05/2017    TDAP status: Up to date   Flu Vaccine status: Up to date   Pneumococcal vaccine status: Up to date   Covid-19 vaccine status: Completed vaccines  Qualifies for Shingles Vaccine? No   Zostavax completed Yes   Shingrix Completed?: Yes  Screening Tests Health Maintenance  Topic Date Due  . Hepatitis C Screening  Never done  . COLONOSCOPY  04/19/2019  . MAMMOGRAM  07/23/2019  . TETANUS/TDAP  02/16/2022  . INFLUENZA VACCINE  Completed  . DEXA SCAN  Completed  . COVID-19 Vaccine  Completed  . PNA vac Low Risk Adult  Completed    Health Maintenance  Health Maintenance Due  Topic Date Due  . Hepatitis C Screening  Never done  . COLONOSCOPY  04/19/2019  . MAMMOGRAM  07/23/2019    Colorectal cancer screening: Due- Patient declined  Mammogram status: Per patient. Scheduled for 06/19/2020  Bone Density status: Due- Patient says her GYN always orders & she will discuss with him.  Lung Cancer Screening: (Low Dose CT Chest recommended if Age 40-80 years, 30 pack-year currently smoking OR have quit w/in 15years.) does not qualify.     Additional Screening:  Hepatitis C Screening: does qualify; Ordered today. To be drawn at next office viist  Vision Screening: Recommended annual ophthalmology exams for early detection of glaucoma and other disorders of the eye. Is the patient up to date with their annual eye exam?  Yes  Who is the provider or what is the name of the office in  which the patient attends annual eye exams? Dr. Gershon Crane I Dental Screening: Recommended annual dental exams for proper oral hygiene  Community Resource Referral / Chronic Care Management: CRR required this visit?  No   CCM required this visit?  No      Plan:     I have personally reviewed and noted the  following in the patient's chart:   . Medical and social history . Use of alcohol, tobacco or illicit drugs  . Current medications and supplements . Functional ability and status . Nutritional status . Physical activity . Advanced directives . List of other physicians . Hospitalizations, surgeries, and ER visits in previous 12 months . Vitals . Screenings to include cognitive, depression, and falls . Referrals and appointments  In addition, I have reviewed and discussed with patient certain preventive protocols, quality metrics, and best practice recommendations. A written personalized care plan for preventive services as well as general preventive health recommendations were provided to patient.   Due to this being a telephonic visit, the after visit summary with patients personalized plan was offered to patient via mail or my-chart.  Patient would like to access on my-chart.   Marta Antu, LPN   [id number]  Nurse Health Advisor  Nurse Notes: None

## 2020-05-22 ENCOUNTER — Telehealth: Payer: Self-pay | Admitting: Family Medicine

## 2020-05-22 NOTE — Telephone Encounter (Signed)
Immunization record updated.

## 2020-05-22 NOTE — Telephone Encounter (Signed)
Patient called to give the following vaccination information: Flu shot received 02/21/20 Covid booster received 04/13/20.

## 2020-05-31 DIAGNOSIS — R768 Other specified abnormal immunological findings in serum: Secondary | ICD-10-CM | POA: Diagnosis not present

## 2020-06-04 ENCOUNTER — Other Ambulatory Visit: Payer: Self-pay | Admitting: Family Medicine

## 2020-07-05 DIAGNOSIS — Z779 Other contact with and (suspected) exposures hazardous to health: Secondary | ICD-10-CM | POA: Diagnosis not present

## 2020-07-05 DIAGNOSIS — Z1231 Encounter for screening mammogram for malignant neoplasm of breast: Secondary | ICD-10-CM | POA: Diagnosis not present

## 2020-07-05 LAB — HM PAP SMEAR: HM Pap smear: NORMAL

## 2020-07-05 LAB — HM MAMMOGRAPHY

## 2020-07-05 LAB — RESULTS CONSOLE HPV: CHL HPV: NEGATIVE

## 2020-07-16 DIAGNOSIS — R768 Other specified abnormal immunological findings in serum: Secondary | ICD-10-CM | POA: Diagnosis not present

## 2020-07-16 DIAGNOSIS — E890 Postprocedural hypothyroidism: Secondary | ICD-10-CM | POA: Diagnosis not present

## 2020-07-19 ENCOUNTER — Other Ambulatory Visit: Payer: Medicare Other

## 2020-07-29 ENCOUNTER — Other Ambulatory Visit: Payer: Self-pay | Admitting: Family Medicine

## 2020-08-06 ENCOUNTER — Other Ambulatory Visit: Payer: Medicare Other

## 2020-08-15 DIAGNOSIS — E890 Postprocedural hypothyroidism: Secondary | ICD-10-CM | POA: Diagnosis not present

## 2020-08-17 DIAGNOSIS — E890 Postprocedural hypothyroidism: Secondary | ICD-10-CM | POA: Diagnosis not present

## 2020-09-06 ENCOUNTER — Other Ambulatory Visit: Payer: Self-pay | Admitting: Cardiovascular Disease

## 2020-10-24 ENCOUNTER — Other Ambulatory Visit: Payer: Self-pay | Admitting: Family Medicine

## 2020-10-29 ENCOUNTER — Ambulatory Visit (INDEPENDENT_AMBULATORY_CARE_PROVIDER_SITE_OTHER): Payer: Medicare Other | Admitting: Family Medicine

## 2020-10-29 ENCOUNTER — Encounter: Payer: Self-pay | Admitting: Family Medicine

## 2020-10-29 ENCOUNTER — Other Ambulatory Visit: Payer: Self-pay

## 2020-10-29 VITALS — BP 125/63 | HR 53 | Temp 97.7°F | Resp 16 | Ht 61.5 in | Wt 164.8 lb

## 2020-10-29 DIAGNOSIS — E039 Hypothyroidism, unspecified: Secondary | ICD-10-CM | POA: Diagnosis not present

## 2020-10-29 DIAGNOSIS — Z1211 Encounter for screening for malignant neoplasm of colon: Secondary | ICD-10-CM

## 2020-10-29 DIAGNOSIS — M8949 Other hypertrophic osteoarthropathy, multiple sites: Secondary | ICD-10-CM | POA: Diagnosis not present

## 2020-10-29 DIAGNOSIS — R252 Cramp and spasm: Secondary | ICD-10-CM | POA: Diagnosis not present

## 2020-10-29 DIAGNOSIS — Z0000 Encounter for general adult medical examination without abnormal findings: Secondary | ICD-10-CM

## 2020-10-29 DIAGNOSIS — E7800 Pure hypercholesterolemia, unspecified: Secondary | ICD-10-CM

## 2020-10-29 DIAGNOSIS — I10 Essential (primary) hypertension: Secondary | ICD-10-CM

## 2020-10-29 DIAGNOSIS — M159 Polyosteoarthritis, unspecified: Secondary | ICD-10-CM

## 2020-10-29 NOTE — Progress Notes (Signed)
Office Note 10/29/2020  CC:  Chief Complaint  Patient presents with  . Annual Exam    Pt is fasting.     HPI:  Renee Lyons is a 70 y.o. White female who is here for annual health maintenance exam and f/u HTN and HLD. She is followed by endo for postsurgical hypothyroidism. A/P as of last visit: " 1) LLQ pain: no worrisome features on hx or exam today. Doubt related to her abd bruit/prominent aortic pulsation. Obs.  2) Aortic bruit w/prominent aortic pulsation: check for AAA with US aorta--ordered.  3) HTN: Stable.  Continue amlodipine 10mg  qd. Lytes/cr today.  4) HLD; tolerating statin--continue this. Lipids panel and liver panel today.  5) Osteoarth bilat hands: helped well by daily meloxicam. Renal fxn monitoring today.  6) anxiety: RARE use of alprazolam. No new rx needed for this med today.  7) Hypoth: followed by endo."  INTERIM HX: Feeling fine, does yard work.  HTN: no home bp checks.  tAking amlodipine 10mg  qd. HLD:  atorva 20mg  qd w/out problem.  Has muscle cramps in calves and toes sometimes. Also stiffness in fingers/hands bilat, mild chronic pain in PIPs and DIPs. Mild deformity and bony hypertrophy of fingers at IP joints bilat.  No redness or acute periods of swelling.  No other joints bothering her except sometimes bottom of R foot MTP region.   Past Medical History:  Diagnosis Date  . Abdominal bruit 04/2020   US aorta 04/2020 NO ANEURISM  . Allergic rhinitis   . Colon cancer screening 07/2019   FIT test NEG 07/24/19. Rpt 1 yr  . GERD (gastroesophageal reflux disease)   . Heart murmur, systolic    aortic valve area, soft, no worrisome features.  . History of thyroidectomy, total    Total thyroidectomy 10/29/16, [facility], surgery done due to compressive sx's  . Hx of cardiovascular stress test 04/09/2012   a. ETT + for ischemia;  b. ETT-MV: (2013) images with no ischemia, EF  77%; +ECG changes c/w ischemia.  Cath and echo normal 2017.  Marland Kitchen  Hyperlipidemia    Great response to statin 2020  . Hypertension   . Hypothyroidism, postsurgical 10/2016   Dr. Elyse Hsu follows  . IBS (irritable bowel syndrome)   . Osteoarthritis of both hands 2016   Both hands, wrists, R knee.  Rheum eval 11/2014 NEG (Dr. Trudie Reed).  Dr. Latanya Maudlin to do bilat thumb surgery when pt ready (as of 07/2018).  . Prominent abdominal aortic pulsation 04/11/2020   + aortic bruit->to get u/s aorta 03/2020  . Seizure disorder [facility]) 1977    Dr Mervyn Skeeters, Neurology.One episode in 1977    Past Surgical History:  Procedure Laterality Date  . bladder tacking  1974  . CARDIAC CATHETERIZATION N/A 07/03/2015   No angiographic evidence of CAD, normal LV filling pressure and systolic fxn, no pulm HTN.  Procedure: Right/Left Heart Cath and Coronary Angiography;  Surgeon: Burnell Blanks, MD;  Location: [facility];  Service: Cardiovascular;  Laterality: N/A;  . COLONOSCOPY  2003 & 2007   Dr Sharlett Iles  . TOTAL THYROIDECTOMY  10/2016   Path: benign multinodular goiter (Dr. Celine Ahr, Surg onc at [facility].  Marland Kitchen TRANSTHORACIC ECHOCARDIOGRAM  09/24/2015   Normal.  EF 60-65%.  Marland Kitchen uterine polypectomy  2008    Dr Nori Riis    Family History  Problem Relation Age of Onset  . Prostate cancer Father   . Heart attack Father 85  . Hypertension Mother   . Thalassemia Mother   .  Diverticulitis Brother   . Suicidality Brother   . Thalassemia Maternal Aunt   . Stroke Neg Hx   . Diabetes Neg Hx     Social History   Socioeconomic History  . Marital status: Married    Spouse name: Not on file  . Number of children: 0  . Years of education: Not on file  . Highest education level: Not on file  Occupational History  . Occupation: Retired, works part-time now for [employer]: RETIRED  Tobacco Use  . Smoking status: Never Smoker  . Smokeless tobacco: Never Used  Vaping Use  . Vaping Use: Never used  Substance and Sexual Activity  . Alcohol use: Yes    Alcohol/week: 1.0  standard drink    Types: 1 Standard drinks or equivalent per week    Comment: Rarely  . Drug use: No  . Sexual activity: Not on file  Other Topics Concern  . Not on file  Social History Narrative   Married, husband is pt of mine Gerardo Territo).   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     No tobacco.   Alc: 1 drink per week.  No drugs.         Social Determinants of Health   Financial Resource Strain: Low Risk   . Difficulty of Paying Living Expenses: Not hard at all  Food Insecurity: No Food Insecurity  . Worried About Charity fundraiser in the Last Year: Never true  . Ran Out of Food in the Last Year: Never true  Transportation Needs: No Transportation Needs  . Lack of Transportation (Medical): No  . Lack of Transportation (Non-Medical): No  Physical Activity: Sufficiently Active  . Days of Exercise per Week: 4 days  . Minutes of Exercise per Session: 40 min  Stress: No Stress Concern Present  . Feeling of Stress : Not at all  Social Connections: Socially Integrated  . Frequency of Communication with Friends and Family: More than three times a week  . Frequency of Social Gatherings with Friends and Family: Once a week  . Attends Religious Services: More than 4 times per year  . Active Member of Clubs or Organizations: Yes  . Attends Archivist Meetings: More than 4 times per year  . Marital Status: Married  Human resources officer Violence: Not At Risk  . Fear of Current or Ex-Partner: No  . Emotionally Abused: No  . Physically Abused: No  . Sexually Abused: No    Outpatient Medications Prior to Visit  Medication Sig Dispense Refill  . ALPRAZolam (XANAX) 0.5 MG tablet Take 0.5 mg by mouth daily as needed for anxiety.   5  . amLODipine (NORVASC) 10 MG tablet Take 1 tablet (10 mg total) by mouth daily. Please make yearly appt with Dr. Angelena Form for April 2022 for future refills. Thank you 1st attempt 90 tablet 0  . atorvastatin (LIPITOR) 20 MG tablet TAKE 1 TABLET  BY MOUTH DAILY 90 tablet 1  . Calcium-Magnesium-Vitamin D (CALCIUM 500 PO) Take 250 tablets by mouth.    . Cholecalciferol (VITAMIN D) 2000 units CAPS Take 2,000 Units by mouth daily.    . fluticasone (FLONASE) 50 MCG/ACT nasal spray SHAKE LIQUID AND USE 2  SPRAYS IN EACH NOSTRIL  EVERY DAY 48 g 3  . furosemide (LASIX) 20 MG tablet 1 tab po qd as needed for swelling of legs 15 tablet 0  . levothyroxine (SYNTHROID) 88 MCG tablet Take 1 tablet (88 mcg total) by  mouth daily. Take 1 tablet 6 days a week and just 1/2 tablet 1 day a week; in AM on empty stomach for thyroid (name brand only DAW) (Patient taking differently: Take 88 mcg by mouth daily. Take 1 tablet daily.)    . meloxicam (MOBIC) 15 MG tablet TAKE 1 TABLET BY MOUTH  DAILY 90 tablet 3  . Multiple Vitamin (MULTIVITAMIN) tablet Take 1 tablet by mouth daily.    Marland Kitchen nystatin-triamcinolone ointment (MYCOLOG) APPLY TOPICALLY AS NEEDED FOR SKIN IRRITATION    . WAL-FEX ALLERGY 60 MG tablet 1/2 tab po bid for allergies 90 tablet 3  . nitroGLYCERIN (NITROSTAT) 0.4 MG SL tablet Place 1 tablet (0.4 mg total) under the tongue every 5 (five) minutes as needed for chest pain. (Patient not taking: No sig reported) 25 tablet 6   No facility-administered medications prior to visit.    Allergies  Allergen Reactions  . Achromycin [Tetracycline] Hives  . Other Rash    "Mycins" - hives    ROS Review of Systems  Constitutional: Negative for appetite change, chills, fatigue and fever.  HENT: Negative for congestion, dental problem, ear pain and sore throat.   Eyes: Negative for discharge, redness and visual disturbance.  Respiratory: Negative for cough, chest tightness, shortness of breath and wheezing.   Cardiovascular: Negative for chest pain, palpitations and leg swelling.  Gastrointestinal: Negative for abdominal pain, blood in stool, diarrhea, nausea and vomiting.  Genitourinary: Negative for difficulty urinating, dysuria, flank pain, frequency,  hematuria and urgency.  Musculoskeletal: Negative for arthralgias (hands, as per hpi), back pain, joint swelling, myalgias and neck stiffness.  Skin: Negative for pallor and rash.  Neurological: Negative for dizziness, speech difficulty, weakness and headaches.  Hematological: Negative for adenopathy. Does not bruise/bleed easily.  Psychiatric/Behavioral: Negative for confusion and sleep disturbance. The patient is not nervous/anxious.    PE; Vitals with BMI 10/29/2020 05/02/2020 04/11/2020  Height 5' 1.5" 5\' 2"  5\' 2"   Weight 164 lbs 13 oz 165 lbs 167 lbs  BMI 30.64 71.24 58.09  Systolic 983 - 382  Diastolic 63 - 70  Pulse 53 - 49   Gen: Alert, well appearing.  Patient is oriented to person, place, time, and situation. AFFECT: pleasant, lucid thought and speech. ENT: Ears: EACs clear, normal epithelium.  TMs with good light reflex and landmarks bilaterally.  Eyes: no injection, icteris, swelling, or exudate.  EOMI, PERRLA. Nose: no drainage or turbinate edema/swelling.  No injection or focal lesion.  Mouth: lips without lesion/swelling.  Oral mucosa pink and moist.  Dentition intact and without obvious caries or gingival swelling.  Oropharynx without erythema, exudate, or swelling.  Neck: supple/nontender.  No LAD, mass, or TM.  Carotid pulses 2+ bilaterally, without bruits. CV: RRR, no m/r/g.   LUNGS: CTA bilat, nonlabored resps, good aeration in all lung fields. ABD: soft, NT, ND, BS normal.  No hepatospenomegaly or mass.  No bruits. EXT: no clubbing, cyanosis, or edema.  Musculoskeletal: bony hypertrophy of PIPs and DIPs of hands bilat, otherwise no joint swelling, erythema, warmth, or tenderness.  Mild angulation deformities at all PIPs and DIPs of fingers.  ROM of all joints intact. Skin - no sores or suspicious lesions or rashes or color changes   Pertinent labs:  Lab Results  Component Value Date   TSH 4.04 08/11/2019   Lab Results  Component Value Date   WBC 5.8 10/05/2019    HGB 11.8 (L) 10/05/2019   HCT 35.5 (L) 10/05/2019   MCV 86.9 10/05/2019  PLT 236.0 10/05/2019   Lab Results  Component Value Date   IRON 60 05/01/2008   Lab Results  Component Value Date   VITAMINB12 451 07/05/2010   Lab Results  Component Value Date   CREATININE 0.94 04/11/2020   BUN 17 04/11/2020   NA 141 04/11/2020   K 4.4 04/11/2020   CL 106 04/11/2020   CO2 28 04/11/2020   Lab Results  Component Value Date   ALT 21 04/11/2020   AST 31 04/11/2020   ALKPHOS 77 04/11/2020   BILITOT 0.5 04/11/2020   Lab Results  Component Value Date   CHOL 160 04/11/2020   Lab Results  Component Value Date   HDL 54.50 04/11/2020   Lab Results  Component Value Date   LDLCALC 89 04/11/2020   Lab Results  Component Value Date   TRIG 80.0 04/11/2020   Lab Results  Component Value Date   CHOLHDL 3 04/11/2020   Lab Results  Component Value Date   HGBA1C 5.3 07/21/2019   ASSESSMENT AND PLAN:   1) Osteoarth, multiple joints. Reassured pt no sign of inflamm arth. Cont prn nsaid.  2) HTN, good control with amlodipine 10mg  qd. Lytes/cr today.  3) HLD: tolerating atorva 20mg  qd. FLP and hepatic panel today.  4) Muscle cramps: encouraged good hydration. Start otc mag ox 500mg  qd and tonic water 3 oz bid.  5) Health maintenance exam: Reviewed age and gender appropriate health maintenance issues (prudent diet, regular exercise, health risks of tobacco and excessive alcohol, use of seatbelts, fire alarms in home, use of sunscreen).  Also reviewed age and gender appropriate health screening as well as vaccine recommendations. Vaccines: ALL UTD. Labs: cbc, cmet, flp. Cervical ca screening: last was 06/2020 (GYN). Breast ca screening: last was Jan 2022-->gets this via GYN ([facility]). Colon ca screening: FIT neg 07/2019, rpt due->given today. Osteoporosis screening: DEXA 10/2015 T-score -2.0 ([facility])->done 2020 at GYN MD->no change.  An After Visit  Summary was printed and given to the patient.  FOLLOW UP:  No follow-ups on file.  Signed:  Crissie Sickles, MD           10/29/2020

## 2020-10-29 NOTE — Patient Instructions (Signed)
To decrease tendency to get muscle cramps, take 500mg  tab of otc magnesium oxide. Also drink 3 ounces of tonic water every morning and evening.  Health Maintenance, Female Adopting a healthy lifestyle and getting preventive care are important in promoting health and wellness. Ask your health care provider about:  The right schedule for you to have regular tests and exams.  Things you can do on your own to prevent diseases and keep yourself healthy. What should I know about diet, weight, and exercise? Eat a healthy diet  Eat a diet that includes plenty of vegetables, fruits, low-fat dairy products, and lean protein.  Do not eat a lot of foods that are high in solid fats, added sugars, or sodium.   Maintain a healthy weight Body mass index (BMI) is used to identify weight problems. It estimates body fat based on height and weight. Your health care provider can help determine your BMI and help you achieve or maintain a healthy weight. Get regular exercise Get regular exercise. This is one of the most important things you can do for your health. Most adults should:  Exercise for at least 150 minutes each week. The exercise should increase your heart rate and make you sweat (moderate-intensity exercise).  Do strengthening exercises at least twice a week. This is in addition to the moderate-intensity exercise.  Spend less time sitting. Even light physical activity can be beneficial. Watch cholesterol and blood lipids Have your blood tested for lipids and cholesterol at 72 years of age, then have this test every 5 years. Have your cholesterol levels checked more often if:  Your lipid or cholesterol levels are high.  You are older than 72 years of age.  You are at high risk for heart disease. What should I know about cancer screening? Depending on your health history and family history, you may need to have cancer screening at various ages. This may include screening for:  Breast  cancer.  Cervical cancer.  Colorectal cancer.  Skin cancer.  Lung cancer. What should I know about heart disease, diabetes, and high blood pressure? Blood pressure and heart disease  High blood pressure causes heart disease and increases the risk of stroke. This is more likely to develop in people who have high blood pressure readings, are of African descent, or are overweight.  Have your blood pressure checked: ? Every 3-5 years if you are 78-25 years of age. ? Every year if you are 71 years old or older. Diabetes Have regular diabetes screenings. This checks your fasting blood sugar level. Have the screening done:  Once every three years after age 54 if you are at a normal weight and have a low risk for diabetes.  More often and at a younger age if you are overweight or have a high risk for diabetes. What should I know about preventing infection? Hepatitis B If you have a higher risk for hepatitis B, you should be screened for this virus. Talk with your health care provider to find out if you are at risk for hepatitis B infection. Hepatitis C Testing is recommended for:  Everyone born from [birth year] through 1967.  Anyone with known risk factors for hepatitis C. Sexually transmitted infections (STIs)  Get screened for STIs, including gonorrhea and chlamydia, if: ? You are sexually active and are younger than 72 years of age. ? You are older than 72 years of age and your health care provider tells you that you are at risk for this type of infection. ?  Your sexual activity has changed since you were last screened, and you are at increased risk for chlamydia or gonorrhea. Ask your health care provider if you are at risk.  Ask your health care provider about whether you are at high risk for HIV. Your health care provider may recommend a prescription medicine to help prevent HIV infection. If you choose to take medicine to prevent HIV, you should first get tested for HIV. You should  then be tested every 3 months for as long as you are taking the medicine. Pregnancy  If you are about to stop having your period (premenopausal) and you may become pregnant, seek counseling before you get pregnant.  Take 400 to 800 micrograms (mcg) of folic acid every day if you become pregnant.  Ask for birth control (contraception) if you want to prevent pregnancy. Osteoporosis and menopause Osteoporosis is a disease in which the bones lose minerals and strength with aging. This can result in bone fractures. If you are 73 years old or older, or if you are at risk for osteoporosis and fractures, ask your health care provider if you should:  Be screened for bone loss.  Take a calcium or vitamin D supplement to lower your risk of fractures.  Be given hormone replacement therapy (HRT) to treat symptoms of menopause. Follow these instructions at home: Lifestyle  Do not use any products that contain nicotine or tobacco, such as cigarettes, e-cigarettes, and chewing tobacco. If you need help quitting, ask your health care provider.  Do not use street drugs.  Do not share needles.  Ask your health care provider for help if you need support or information about quitting drugs. Alcohol use  Do not drink alcohol if: ? Your health care provider tells you not to drink. ? You are pregnant, may be pregnant, or are planning to become pregnant.  If you drink alcohol: ? Limit how much you use to 0-1 drink a day. ? Limit intake if you are breastfeeding.  Be aware of how much alcohol is in your drink. In the [place] one drink equals one 12 oz bottle of beer (355 mL), one 5 oz glass of wine (148 mL), or one 1 oz glass of hard liquor (44 mL). General instructions  Schedule regular health, dental, and eye exams.  Stay current with your vaccines.  Tell your health care provider if: ? You often feel depressed. ? You have ever been abused or do not feel safe at home. Summary  Adopting a  healthy lifestyle and getting preventive care are important in promoting health and wellness.  Follow your health care provider's instructions about healthy diet, exercising, and getting tested or screened for diseases.  Follow your health care provider's instructions on monitoring your cholesterol and blood pressure. This information is not intended to replace advice given to you by your health care provider. Make sure you discuss any questions you have with your health care provider. Document Revised: 05/26/2018 Document Reviewed: 05/26/2018 Elsevier Patient Education  2021 [employer].

## 2020-10-30 ENCOUNTER — Encounter: Payer: Self-pay | Admitting: Family Medicine

## 2020-10-30 LAB — CBC WITH DIFFERENTIAL/PLATELET
Absolute Monocytes: 369 cells/uL (ref 200–950)
Basophils Absolute: 50 cells/uL (ref 0–200)
Basophils Relative: 0.9 %
Eosinophils Absolute: 77 cells/uL (ref 15–500)
Eosinophils Relative: 1.4 %
HCT: 37.4 % (ref 35.0–45.0)
Hemoglobin: 12.3 g/dL (ref 11.7–15.5)
Lymphs Abs: 1942 cells/uL (ref 850–3900)
MCH: 28.8 pg (ref 27.0–33.0)
MCHC: 32.9 g/dL (ref 32.0–36.0)
MCV: 87.6 fL (ref 80.0–100.0)
MPV: 11.2 fL (ref 7.5–12.5)
Monocytes Relative: 6.7 %
Neutro Abs: 3064 cells/uL (ref 1500–7800)
Neutrophils Relative %: 55.7 %
Platelets: 231 10*3/uL (ref 140–400)
RBC: 4.27 10*6/uL (ref 3.80–5.10)
RDW: 12.9 % (ref 11.0–15.0)
Total Lymphocyte: 35.3 %
WBC: 5.5 10*3/uL (ref 3.8–10.8)

## 2020-10-30 LAB — COMPREHENSIVE METABOLIC PANEL
AG Ratio: 1.4 (calc) (ref 1.0–2.5)
ALT: 20 U/L (ref 6–29)
AST: 26 U/L (ref 10–35)
Albumin: 4.3 g/dL (ref 3.6–5.1)
Alkaline phosphatase (APISO): 87 U/L (ref 37–153)
BUN/Creatinine Ratio: 27 (calc) — ABNORMAL HIGH (ref 6–22)
BUN: 26 mg/dL — ABNORMAL HIGH (ref 7–25)
CO2: 24 mmol/L (ref 20–32)
Calcium: 9.7 mg/dL (ref 8.6–10.4)
Chloride: 106 mmol/L (ref 98–110)
Creat: 0.96 mg/dL — ABNORMAL HIGH (ref 0.60–0.93)
Globulin: 3.1 g/dL (calc) (ref 1.9–3.7)
Glucose, Bld: 79 mg/dL (ref 65–99)
Potassium: 4.9 mmol/L (ref 3.5–5.3)
Sodium: 140 mmol/L (ref 135–146)
Total Bilirubin: 0.6 mg/dL (ref 0.2–1.2)
Total Protein: 7.4 g/dL (ref 6.1–8.1)

## 2020-10-30 LAB — LIPID PANEL
Cholesterol: 174 mg/dL (ref <200)
HDL: 61 mg/dL (ref >=50)
LDL Cholesterol (Calc): 97 mg/dL (calc)
Non-HDL Cholesterol (Calc): 113 mg/dL (calc) (ref <130)
Total CHOL/HDL Ratio: 2.9 (calc) (ref <5.0)
Triglycerides: 70 mg/dL (ref <150)

## 2020-11-05 NOTE — Addendum Note (Signed)
Addended by: Octaviano Glow on: 11/05/2020 02:32 PM   Modules accepted: Orders

## 2020-11-06 ENCOUNTER — Other Ambulatory Visit: Payer: Medicare Other

## 2020-11-06 LAB — FECAL OCCULT BLOOD, IMMUNOCHEMICAL: Fecal Occult Bld: NEGATIVE

## 2020-11-07 ENCOUNTER — Encounter: Payer: Self-pay | Admitting: Family Medicine

## 2020-11-08 DIAGNOSIS — H52202 Unspecified astigmatism, left eye: Secondary | ICD-10-CM | POA: Diagnosis not present

## 2020-11-08 DIAGNOSIS — H5212 Myopia, left eye: Secondary | ICD-10-CM | POA: Diagnosis not present

## 2020-11-08 DIAGNOSIS — H26493 Other secondary cataract, bilateral: Secondary | ICD-10-CM | POA: Diagnosis not present

## 2020-11-08 DIAGNOSIS — Z961 Presence of intraocular lens: Secondary | ICD-10-CM | POA: Diagnosis not present

## 2020-11-08 DIAGNOSIS — H524 Presbyopia: Secondary | ICD-10-CM | POA: Diagnosis not present

## 2020-11-19 DIAGNOSIS — M19071 Primary osteoarthritis, right ankle and foot: Secondary | ICD-10-CM | POA: Diagnosis not present

## 2020-11-19 DIAGNOSIS — M65871 Other synovitis and tenosynovitis, right ankle and foot: Secondary | ICD-10-CM | POA: Diagnosis not present

## 2020-11-19 DIAGNOSIS — M25571 Pain in right ankle and joints of right foot: Secondary | ICD-10-CM | POA: Diagnosis not present

## 2020-11-19 DIAGNOSIS — M79671 Pain in right foot: Secondary | ICD-10-CM | POA: Diagnosis not present

## 2020-11-25 ENCOUNTER — Other Ambulatory Visit: Payer: Self-pay | Admitting: Family Medicine

## 2020-11-28 ENCOUNTER — Other Ambulatory Visit: Payer: Self-pay

## 2020-11-28 DIAGNOSIS — H26492 Other secondary cataract, left eye: Secondary | ICD-10-CM | POA: Diagnosis not present

## 2020-11-28 DIAGNOSIS — H26493 Other secondary cataract, bilateral: Secondary | ICD-10-CM | POA: Diagnosis not present

## 2020-11-28 MED ORDER — AMLODIPINE BESYLATE 10 MG PO TABS: 10.0000 mg | ORAL_TABLET | Freq: Every day | ORAL | 0 refills | Status: DC

## 2020-12-03 DIAGNOSIS — M25571 Pain in right ankle and joints of right foot: Secondary | ICD-10-CM | POA: Diagnosis not present

## 2020-12-03 DIAGNOSIS — M79675 Pain in left toe(s): Secondary | ICD-10-CM | POA: Diagnosis not present

## 2020-12-03 DIAGNOSIS — M79674 Pain in right toe(s): Secondary | ICD-10-CM | POA: Diagnosis not present

## 2020-12-03 DIAGNOSIS — M7671 Peroneal tendinitis, right leg: Secondary | ICD-10-CM | POA: Diagnosis not present

## 2020-12-03 DIAGNOSIS — L600 Ingrowing nail: Secondary | ICD-10-CM | POA: Diagnosis not present

## 2020-12-04 DIAGNOSIS — B351 Tinea unguium: Secondary | ICD-10-CM | POA: Diagnosis not present

## 2020-12-05 ENCOUNTER — Other Ambulatory Visit: Payer: Self-pay | Admitting: Cardiovascular Disease

## 2020-12-05 ENCOUNTER — Telehealth: Payer: Self-pay

## 2020-12-05 NOTE — Telephone Encounter (Signed)
Would like someone to explain more about her test results because she doesn't understand what BUN CREA is.  Please call patient [phone]

## 2020-12-06 NOTE — Telephone Encounter (Signed)
Spoke with pt to inform her that this is apart of her kidney fx. And that her provider is monitoring this along with other labs

## 2020-12-10 ENCOUNTER — Other Ambulatory Visit: Payer: Self-pay | Admitting: Cardiovascular Disease

## 2020-12-31 DIAGNOSIS — M79675 Pain in left toe(s): Secondary | ICD-10-CM | POA: Diagnosis not present

## 2020-12-31 DIAGNOSIS — L600 Ingrowing nail: Secondary | ICD-10-CM | POA: Diagnosis not present

## 2020-12-31 DIAGNOSIS — M79674 Pain in right toe(s): Secondary | ICD-10-CM | POA: Diagnosis not present

## 2020-12-31 DIAGNOSIS — B351 Tinea unguium: Secondary | ICD-10-CM | POA: Diagnosis not present

## 2021-01-03 DIAGNOSIS — L821 Other seborrheic keratosis: Secondary | ICD-10-CM | POA: Diagnosis not present

## 2021-01-03 DIAGNOSIS — L720 Epidermal cyst: Secondary | ICD-10-CM | POA: Diagnosis not present

## 2021-01-03 DIAGNOSIS — D229 Melanocytic nevi, unspecified: Secondary | ICD-10-CM | POA: Diagnosis not present

## 2021-01-03 DIAGNOSIS — Z85828 Personal history of other malignant neoplasm of skin: Secondary | ICD-10-CM | POA: Diagnosis not present

## 2021-01-03 DIAGNOSIS — D489 Neoplasm of uncertain behavior, unspecified: Secondary | ICD-10-CM | POA: Diagnosis not present

## 2021-01-03 DIAGNOSIS — L814 Other melanin hyperpigmentation: Secondary | ICD-10-CM | POA: Diagnosis not present

## 2021-01-03 DIAGNOSIS — C44311 Basal cell carcinoma of skin of nose: Secondary | ICD-10-CM | POA: Diagnosis not present

## 2021-01-03 DIAGNOSIS — L820 Inflamed seborrheic keratosis: Secondary | ICD-10-CM | POA: Diagnosis not present

## 2021-01-28 DIAGNOSIS — M79674 Pain in right toe(s): Secondary | ICD-10-CM | POA: Diagnosis not present

## 2021-01-28 DIAGNOSIS — L600 Ingrowing nail: Secondary | ICD-10-CM | POA: Diagnosis not present

## 2021-01-28 DIAGNOSIS — M79675 Pain in left toe(s): Secondary | ICD-10-CM | POA: Diagnosis not present

## 2021-01-28 DIAGNOSIS — B351 Tinea unguium: Secondary | ICD-10-CM | POA: Diagnosis not present

## 2021-01-30 DIAGNOSIS — C44311 Basal cell carcinoma of skin of nose: Secondary | ICD-10-CM | POA: Diagnosis not present

## 2021-01-30 DIAGNOSIS — D485 Neoplasm of uncertain behavior of skin: Secondary | ICD-10-CM | POA: Diagnosis not present

## 2021-02-25 DIAGNOSIS — M79674 Pain in right toe(s): Secondary | ICD-10-CM | POA: Diagnosis not present

## 2021-02-25 DIAGNOSIS — M79675 Pain in left toe(s): Secondary | ICD-10-CM | POA: Diagnosis not present

## 2021-02-25 DIAGNOSIS — B351 Tinea unguium: Secondary | ICD-10-CM | POA: Diagnosis not present

## 2021-02-25 DIAGNOSIS — L600 Ingrowing nail: Secondary | ICD-10-CM | POA: Diagnosis not present

## 2021-03-11 DIAGNOSIS — E890 Postprocedural hypothyroidism: Secondary | ICD-10-CM | POA: Diagnosis not present

## 2021-03-16 HISTORY — PX: BASAL CELL CARCINOMA EXCISION: SHX1214

## 2021-03-19 DIAGNOSIS — Z79899 Other long term (current) drug therapy: Secondary | ICD-10-CM | POA: Diagnosis not present

## 2021-03-19 DIAGNOSIS — I10 Essential (primary) hypertension: Secondary | ICD-10-CM | POA: Diagnosis not present

## 2021-03-19 DIAGNOSIS — E890 Postprocedural hypothyroidism: Secondary | ICD-10-CM | POA: Diagnosis not present

## 2021-04-02 ENCOUNTER — Encounter: Payer: Self-pay | Admitting: General Surgery

## 2021-04-14 ENCOUNTER — Other Ambulatory Visit: Payer: Self-pay | Admitting: Cardiovascular Disease

## 2021-04-15 ENCOUNTER — Other Ambulatory Visit: Payer: Self-pay | Admitting: *Deleted

## 2021-04-15 MED ORDER — AMLODIPINE BESYLATE 10 MG PO TABS: 10.0000 mg | ORAL_TABLET | Freq: Every day | ORAL | 2 refills | Status: DC

## 2021-04-15 MED ORDER — AMLODIPINE BESYLATE 10 MG PO TABS
10.0000 mg | ORAL_TABLET | Freq: Every day | ORAL | 0 refills | Status: DC
Start: 2021-04-15 — End: 2021-04-15

## 2021-04-21 ENCOUNTER — Other Ambulatory Visit: Payer: Self-pay | Admitting: Cardiovascular Disease

## 2021-05-08 ENCOUNTER — Ambulatory Visit: Payer: Medicare Other

## 2021-05-26 ENCOUNTER — Other Ambulatory Visit: Payer: Self-pay | Admitting: Family Medicine

## 2021-05-27 DIAGNOSIS — B351 Tinea unguium: Secondary | ICD-10-CM | POA: Diagnosis not present

## 2021-05-27 DIAGNOSIS — L600 Ingrowing nail: Secondary | ICD-10-CM | POA: Diagnosis not present

## 2021-05-27 DIAGNOSIS — M79675 Pain in left toe(s): Secondary | ICD-10-CM | POA: Diagnosis not present

## 2021-05-27 DIAGNOSIS — M79674 Pain in right toe(s): Secondary | ICD-10-CM | POA: Diagnosis not present

## 2021-06-19 ENCOUNTER — Other Ambulatory Visit: Payer: Self-pay

## 2021-06-19 ENCOUNTER — Ambulatory Visit (INDEPENDENT_AMBULATORY_CARE_PROVIDER_SITE_OTHER): Payer: Medicare Other

## 2021-06-19 DIAGNOSIS — Z1211 Encounter for screening for malignant neoplasm of colon: Secondary | ICD-10-CM

## 2021-06-19 DIAGNOSIS — Z0000 Encounter for general adult medical examination without abnormal findings: Secondary | ICD-10-CM | POA: Diagnosis not present

## 2021-06-19 NOTE — Progress Notes (Signed)
Virtual Visit via Telephone Note  I connected with  Renee Lyons on 06/19/21 at  1:45 PM EST by telephone and verified that I am speaking with the correct person using two identifiers.  Medicare Annual Wellness visit completed telephonically due to Covid-19 pandemic.   Persons participating in this call: This Health Coach and this patient.   Location: Patient: home Provider: office   I discussed the limitations, risks, security and privacy concerns of performing an evaluation and management service by telephone and the availability of in person appointments. The patient expressed understanding and agreed to proceed.  Unable to perform video visit due to video visit attempted and failed and/or patient does not have video capability.   Some vital signs may be absent or patient reported.   Willette Brace, LPN   Subjective:   Renee Lyons is a 71 y.o. female who presents for Medicare Annual (Subsequent) preventive examination.  Review of Systems     Cardiac Risk Factors include: advanced age (>75men, >26 women);hypertension;obesity (BMI >30kg/m2)     Objective:    There were no vitals filed for this visit. There is no height or weight on file to calculate BMI.  Advanced Directives 06/19/2021 05/02/2020 04/23/2018 04/06/2017 04/04/2016 03/31/2016 07/03/2015  Does Patient Have a Medical Advance Directive? Yes Yes Yes Yes Yes No Yes  Type of Paramedic of [place];Living will Living will;Healthcare Power of [place];Living will [place];Living will - [place];Living will  Does patient want to make changes to medical advance directive? - - - - No - Patient declined - -  Copy of [place] in Chart? No - copy requested No - copy requested No - copy requested No - copy requested No - copy requested - No - copy requested    Current Medications  (verified) Outpatient Encounter Medications as of 06/19/2021  Medication Sig   ALPRAZolam (XANAX) 0.5 MG tablet Take 0.5 mg by mouth daily as needed for anxiety.    amLODipine (NORVASC) 10 MG tablet TAKE 1 TABLET(10 MG) BY MOUTH DAILY   atorvastatin (LIPITOR) 20 MG tablet Take 1 tablet (20 mg total) by mouth daily. OFFICE VISIT NEEDED FOR FURTHER REFILLS   Calcium-Magnesium-Vitamin D (CALCIUM 500 PO) Take 250 tablets by mouth.   Cholecalciferol (VITAMIN D) 2000 units CAPS Take 2,000 Units by mouth daily.   fluticasone (FLONASE) 50 MCG/ACT nasal spray SHAKE LIQUID AND USE 2  SPRAYS IN EACH NOSTRIL  EVERY DAY   levothyroxine (SYNTHROID) 88 MCG tablet Take 1 tablet (88 mcg total) by mouth daily. Take 1 tablet 6 days a week and just 1/2 tablet 1 day a week; in AM on empty stomach for thyroid (name brand only DAW) (Patient taking differently: Take 88 mcg by mouth daily. Take 1 tablet daily.)   meloxicam (MOBIC) 15 MG tablet TAKE 1 TABLET BY MOUTH  DAILY   Multiple Vitamin (MULTIVITAMIN) tablet Take 1 tablet by mouth daily.   nystatin-triamcinolone ointment (MYCOLOG) APPLY TOPICALLY AS NEEDED FOR SKIN IRRITATION   FLUAD QUADRIVALENT 0.5 ML injection    WAL-FEX ALLERGY 60 MG tablet 1/2 tab po bid for allergies (Patient not taking: Reported on 06/19/2021)   [DISCONTINUED] furosemide (LASIX) 20 MG tablet 1 tab po qd as needed for swelling of legs (Patient not taking: Reported on 06/19/2021)   [DISCONTINUED] nitroGLYCERIN (NITROSTAT) 0.4 MG SL tablet Place 1 tablet (0.4 mg total) under the tongue every  5 (five) minutes as needed for chest pain. (Patient not taking: No sig reported)   No facility-administered encounter medications on file as of 06/19/2021.    Allergies (verified) Achromycin [tetracycline] and Other   History: Past Medical History:  Diagnosis Date   Abdominal bruit 04/2020   US aorta 04/2020 NO ANEURISM   Allergic rhinitis    Colon cancer screening 07/2019   FIT test NEG 07/24/19 and  10/2020.   GERD (gastroesophageal reflux disease)    Heart murmur, systolic    aortic valve area, soft, no worrisome features.   History of thyroidectomy, total    Total thyroidectomy 10/29/16, [facility], surgery done due to compressive sx's   Hx of cardiovascular stress test 04/09/2012   a. ETT + for ischemia;  b. ETT-MV: (2013) images with no ischemia, EF  77%; +ECG changes c/w ischemia.  Cath and echo normal 2017.   Hyperlipidemia    Great response to statin 2020   Hypertension    Hypothyroidism, postsurgical 10/2016   Dr. Elyse Hsu follows   IBS (irritable bowel syndrome)    Osteoarthritis of both hands 2016   Both hands, wrists, R knee.  Rheum eval 11/2014 NEG (Dr. Trudie Reed).  Dr. Latanya Maudlin to do bilat thumb surgery when pt ready (as of 07/2018).   Prominent abdominal aortic pulsation 04/11/2020   + aortic bruit->to get u/s aorta 03/2020   Seizure disorder [facility]) 1977    Dr Mervyn Skeeters, Neurology.One episode in 1977   Past Surgical History:  Procedure Laterality Date   bladder tacking  1974   CARDIAC CATHETERIZATION N/A 07/03/2015   No angiographic evidence of CAD, normal LV filling pressure and systolic fxn, no pulm HTN.  Procedure: Right/Left Heart Cath and Coronary Angiography;  Surgeon: Burnell Blanks, MD;  Location: [place] [facility];  Service: Cardiovascular;  Laterality: N/A;   COLONOSCOPY  2003 & 2007   Dr Sharlett Iles   TOTAL THYROIDECTOMY  10/2016   Path: benign multinodular goiter (Dr. Celine Ahr, Surg onc at [facility]   TRANSTHORACIC ECHOCARDIOGRAM  09/24/2015   Normal.  EF 60-65%.   uterine polypectomy  2008    Dr Nori Riis   Family History  Problem Relation Age of Onset   Prostate cancer Father    Heart attack Father 18   Hypertension Mother    Thalassemia Mother    Diverticulitis Brother    Suicidality Brother    Thalassemia Maternal Aunt    Stroke Neg Hx    Diabetes Neg Hx    Social History   Socioeconomic History   Marital status: Married    Spouse name: Not on file    Number of children: 0   Years of education: Not on file   Highest education level: Not on file  Occupational History   Occupation: Retired, works part-time now for [employer]: RETIRED  Tobacco Use   Smoking status: Never   Smokeless tobacco: Never  Vaping Use   Vaping Use: Never used  Substance and Sexual Activity   Alcohol use: Yes    Alcohol/week: 1.0 standard drink    Types: 1 Standard drinks or equivalent per week    Comment: Rarely   Drug use: No   Sexual activity: Not on file  Other Topics Concern   Not on file  Social History Narrative   Married, husband is pt of mine Laban Emperor).   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     No tobacco.   Alc: 1 drink per week.  No  drugs.         Social Determinants of Health   Financial Resource Strain: Low Risk    Difficulty of Paying Living Expenses: Not hard at all  Food Insecurity: No Food Insecurity   Worried About Charity fundraiser in the Last Year: Never true   [place] in the Last Year: Never true  Transportation Needs: No Transportation Needs   Lack of Transportation (Medical): No   Lack of Transportation (Non-Medical): No  Physical Activity: Sufficiently Active   Days of Exercise per Week: 4 days   Minutes of Exercise per Session: 60 min  Stress: No Stress Concern Present   Feeling of Stress : Only a little  Social Connections: Engineer, building services of Communication with Friends and Family: More than three times a week   Frequency of Social Gatherings with Friends and Family: More than three times a week   Attends Religious Services: More than 4 times per year   Active Member of [employer] or Organizations: Yes   Attends Archivist Meetings: 1 to 4 times per year   Marital Status: Married    Tobacco Counseling Counseling given: Not Answered   Clinical Intake:  Pre-visit preparation completed: Yes  Pain : No/denies pain     BMI - recorded: 30.64 Nutritional Status:  BMI > 30  Obese Nutritional Risks: None Diabetes: No  How often do you need to have someone help you when you read instructions, pamphlets, or other written materials from your doctor or pharmacy?: 1 - Never  Diabetic?no  Interpreter Needed?: No  Information entered by :: Charlott Rakes, LPN   Activities of Daily Living In your present state of health, do you have any difficulty performing the following activities: 06/19/2021  Hearing? N  Vision? N  Difficulty concentrating or making decisions? N  Walking or climbing stairs? N  Dressing or bathing? N  Doing errands, shopping? N  Preparing Food and eating ? N  Using the Toilet? N  In the past six months, have you accidently leaked urine? N  Do you have problems with loss of bowel control? N  Managing your Medications? N  Managing your Finances? N  Housekeeping or managing your Housekeeping? N  Some recent data might be hidden    Patient Care Team: [name], MD as PCP - General (Family Medicine) Burnell Blanks, MD as PCP - Cardiology (Cardiology) Maisie Fus, MD as Consulting Physician (Obstetrics and Gynecology) Rutherford Guys, MD as Consulting Physician (Ophthalmology) Randol Kern (Dentistry) Altheimer, Legrand Como, MD as Consulting Physician (Endocrinology) Fredirick Maudlin, MD (Inactive) as Consulting Physician (General Surgery) Loney Loh, MD (Dermatology) Melrose Nakayama, MD as Consulting Physician (Orthopedic Surgery) Williford, Layne Benton, MD as Consulting Physician (Dermatology)  Indicate any recent Medical Services you may have received from other than [facility] providers in the past year (date may be approximate).     Assessment:   This is a routine wellness examination for Renee Lyons.  Hearing/Vision screen Hearing Screening - Comments:: Pt denies any hearing issues  Vision Screening - Comments:: Pt follows up with dr Gershon Crane for annual eye exams   Dietary issues and exercise activities  discussed: Current Exercise Habits: Home exercise routine, Type of exercise: Other - see comments, Time (Minutes): 60, Frequency (Times/Week): 4, Weekly Exercise (Minutes/Week): 240   Goals Addressed             This Visit's Progress    Patient Stated  Lose 10 more        Depression Screen PHQ 2/9 Scores 06/19/2021 05/02/2020 10/05/2019 04/23/2018 04/06/2017 04/04/2016  PHQ - 2 Score 1 0 1 0 0 0    Fall Risk Fall Risk  06/19/2021 05/02/2020 10/05/2019 04/23/2018 04/06/2017  Falls in the past year? 0 1 0 1 No  Comment - - - fell in trash can -  Number falls in past yr: 0 0 0 0 -  Injury with Fall? 0 0 0 0 -  Risk for fall due to : Impaired balance/gait - - - -  Follow up Falls prevention discussed Falls prevention discussed Falls evaluation completed - -    FALL RISK PREVENTION PERTAINING TO THE HOME:  Any stairs in or around the home? No  If so, are there any without handrails? No  Home free of loose throw rugs in walkways, pet beds, electrical cords, etc? Yes  Adequate lighting in your home to reduce risk of falls? Yes   ASSISTIVE DEVICES UTILIZED TO PREVENT FALLS:  Life alert? No  Use of a cane, walker or w/c? No  Grab bars in the bathroom? No  Shower chair or bench in shower? No  Elevated toilet seat or a handicapped toilet? No   TIMED UP AND GO:  Was the test performed? No .   Cognitive Function: MMSE - Mini Mental State Exam 04/23/2018  Orientation to time 5  Orientation to Place 5  Registration 3  Attention/ Calculation 3  Recall 1  Language- name 2 objects 2  Language- repeat 1  Language- follow 3 step command 3  Language- read & follow direction 1  Write a sentence 1  Copy design 1  Total score 26        Immunizations Immunization History  Administered Date(s) Administered   Influenza, High Dose Seasonal PF 04/04/2016, 02/15/2017, 02/05/2019   Influenza,inj,Quad PF,6+ Mos 03/25/2013, 03/22/2014   Influenza-Unspecified 03/24/2015, 03/04/2018,  02/15/2021   Moderna Sars-Covid-2 Vaccination 07/07/2019, 08/12/2019, 04/13/2020, 03/15/2021   Pneumococcal Conjugate-13 02/03/2014   Pneumococcal Polysaccharide-23 04/04/2016   Tdap 02/17/2012   Zoster Recombinat (Shingrix) 03/16/2017, 07/05/2017   Zoster, Live 12/03/2011    TDAP status: Up to date  Flu Vaccine status: Up to date  Pneumococcal vaccine status: Up to date  Covid-19 vaccine status: Completed vaccines  Qualifies for Shingles Vaccine? Yes   Zostavax completed Yes   Shingrix Completed?: Yes  Screening Tests Health Maintenance  Topic Date Due   Hepatitis C Screening  Never done   Fecal DNA (Cologuard)  04/19/2019   COVID-19 Vaccine (5 - Booster for Moderna series) 05/10/2021   MAMMOGRAM  07/05/2021   TETANUS/TDAP  02/16/2022   Pneumonia Vaccine 68+ Years old  Completed   INFLUENZA VACCINE  Completed   DEXA SCAN  Completed   Zoster Vaccines- Shingrix  Completed   HPV VACCINES  Aged Out   COLONOSCOPY (Pts 45-19yrs Insurance coverage will need to be confirmed)  [place] Maintenance Due  Topic Date Due   Hepatitis C Screening  Never done   Fecal DNA (Cologuard)  04/19/2019   COVID-19 Vaccine (5 - Booster for Moderna series) 05/10/2021    Colorectal cancer screening: Referral to GI placed 06/19/21. Pt aware the office will call re: appt.  Mammogram status: Completed 07/05/20. Repeat every year  Bone Density status: Completed 11/05/15. Results reflect: Bone density results: OSTEOPENIA. Repeat every 2 years.    Additional Screening:  Hepatitis C Screening: does qualify;  Vision  Screening: Recommended annual ophthalmology exams for early detection of glaucoma and other disorders of the eye. Is the patient up to date with their annual eye exam?  Yes  Who is the provider or what is the name of the office in which the patient attends annual eye exams? Dr Gershon Crane  If pt is not established with a provider, would they like to be  referred to a provider to establish care? No .   Dental Screening: Recommended annual dental exams for proper oral hygiene  Community Resource Referral / Chronic Care Management: CRR required this visit?  No   CCM required this visit?  No      Plan:     I have personally reviewed and noted the following in the patients chart:   Medical and social history Use of alcohol, tobacco or illicit drugs  Current medications and supplements including opioid prescriptions.  Functional ability and status Nutritional status Physical activity Advanced directives List of other physicians Hospitalizations, surgeries, and ER visits in previous 12 months Vitals Screenings to include cognitive, depression, and falls Referrals and appointments  In addition, I have reviewed and discussed with patient certain preventive protocols, quality metrics, and best practice recommendations. A written personalized care plan for preventive services as well as general preventive health recommendations were provided to patient.     Willette Brace, LPN   12/17/6679   Nurse Notes: None

## 2021-06-19 NOTE — Patient Instructions (Signed)
Renee Lyons , Thank you for taking time to come for your Medicare Wellness Visit. I appreciate your ongoing commitment to your health goals. Please review the following plan we discussed and let me know if I can assist you in the future.   Screening recommendations/referrals: Colonoscopy: order placed 06/19/21 Mammogram: done 07/05/20 repeat every year  Bone Density: done 11/05/15 repeat every 2 years  Recommended yearly ophthalmology/optometry visit for glaucoma screening and checkup Recommended yearly dental visit for hygiene and checkup  Vaccinations: Influenza vaccine: Done 02/15/21 repeat  Pneumococcal vaccine: Up to date Tdap vaccine: done 02/17/12 repeat every 10 years  Shingles vaccine: Completed 03/16/17 & 07/05/17   Covid-19:Completed 1/21, 2/26, 03/17/18 & 03/15/21  Advanced directives: Please bring a copy of your health care power of attorney and living will to the office at your convenience.  Conditions/risks identified: Lose 10lbs    Next appointment: Follow up in one year for your annual wellness visit    Preventive Care 65 Years and Older, Female Preventive care refers to lifestyle choices and visits with your health care provider that can promote health and wellness. What does preventive care include? A yearly physical exam. This is also called an annual well check. Dental exams once or twice a year. Routine eye exams. Ask your health care provider how often you should have your eyes checked. Personal lifestyle choices, including: Daily care of your teeth and gums. Regular physical activity. Eating a healthy diet. Avoiding tobacco and drug use. Limiting alcohol use. Practicing safe sex. Taking low-dose aspirin every day. Taking vitamin and mineral supplements as recommended by your health care provider. What happens during an annual well check? The services and screenings done by your health care provider during your annual well check will depend on your age, overall  health, lifestyle risk factors, and family history of disease. Counseling  Your health care provider may ask you questions about your: Alcohol use. Tobacco use. Drug use. Emotional well-being. Home and relationship well-being. Sexual activity. Eating habits. History of falls. Memory and ability to understand (cognition). Work and work Statistician. Reproductive health. Screening  You may have the following tests or measurements: Height, weight, and BMI. Blood pressure. Lipid and cholesterol levels. These may be checked every 5 years, or more frequently if you are over 33 years old. Skin check. Lung cancer screening. You may have this screening every year starting at age 63 if you have a 30-pack-year history of smoking and currently smoke or have quit within the past 15 years. Fecal occult blood test (FOBT) of the stool. You may have this test every year starting at age 84. Flexible sigmoidoscopy or colonoscopy. You may have a sigmoidoscopy every 5 years or a colonoscopy every 10 years starting at age 3. Hepatitis C blood test. Hepatitis B blood test. Sexually transmitted disease (STD) testing. Diabetes screening. This is done by checking your blood sugar (glucose) after you have not eaten for a while (fasting). You may have this done every 1-3 years. Bone density scan. This is done to screen for osteoporosis. You may have this done starting at age 87. Mammogram. This may be done every 1-2 years. Talk to your health care provider about how often you should have regular mammograms. Talk with your health care provider about your test results, treatment options, and if necessary, the need for more tests. Vaccines  Your health care provider may recommend certain vaccines, such as: Influenza vaccine. This is recommended every year. Tetanus, diphtheria, and acellular pertussis (Tdap, Td)  vaccine. You may need a Td booster every 10 years. Zoster vaccine. You may need this after age  59. Pneumococcal 13-valent conjugate (PCV13) vaccine. One dose is recommended after age 35. Pneumococcal polysaccharide (PPSV23) vaccine. One dose is recommended after age 18. Talk to your health care provider about which screenings and vaccines you need and how often you need them. This information is not intended to replace advice given to you by your health care provider. Make sure you discuss any questions you have with your health care provider. Document Released: 06/29/2015 Document Revised: 02/20/2016 Document Reviewed: 04/03/2015 Elsevier Interactive Patient Education  2017 [place] Prevention in the Home Falls can cause injuries. They can happen to people of all ages. There are many things you can do to make your home safe and to help prevent falls. What can I do on the outside of my home? Regularly fix the edges of walkways and driveways and fix any cracks. Remove anything that might make you trip as you walk through a door, such as a raised step or threshold. Trim any bushes or trees on the path to your home. Use bright outdoor lighting. Clear any walking paths of anything that might make someone trip, such as rocks or tools. Regularly check to see if handrails are loose or broken. Make sure that both sides of any steps have handrails. Any raised decks and porches should have guardrails on the edges. Have any leaves, snow, or ice cleared regularly. Use sand or salt on walking paths during winter. Clean up any spills in your garage right away. This includes oil or grease spills. What can I do in the bathroom? Use night lights. Install grab bars by the toilet and in the tub and shower. Do not use towel bars as grab bars. Use non-skid mats or decals in the tub or shower. If you need to sit down in the shower, use a plastic, non-slip stool. Keep the floor dry. Clean up any water that spills on the floor as soon as it happens. Remove soap buildup in the tub or shower  regularly. Attach bath mats securely with double-sided non-slip rug tape. Do not have throw rugs and other things on the floor that can make you trip. What can I do in the bedroom? Use night lights. Make sure that you have a light by your bed that is easy to reach. Do not use any sheets or blankets that are too big for your bed. They should not hang down onto the floor. Have a firm chair that has side arms. You can use this for support while you get dressed. Do not have throw rugs and other things on the floor that can make you trip. What can I do in the kitchen? Clean up any spills right away. Avoid walking on wet floors. Keep items that you use a lot in easy-to-reach places. If you need to reach something above you, use a strong step stool that has a grab bar. Keep electrical cords out of the way. Do not use floor polish or wax that makes floors slippery. If you must use wax, use non-skid floor wax. Do not have throw rugs and other things on the floor that can make you trip. What can I do with my stairs? Do not leave any items on the stairs. Make sure that there are handrails on both sides of the stairs and use them. Fix handrails that are broken or loose. Make sure that handrails are as long  as the stairways. Check any carpeting to make sure that it is firmly attached to the stairs. Fix any carpet that is loose or worn. Avoid having throw rugs at the top or bottom of the stairs. If you do have throw rugs, attach them to the floor with carpet tape. Make sure that you have a light switch at the top of the stairs and the bottom of the stairs. If you do not have them, ask someone to add them for you. What else can I do to help prevent falls? Wear shoes that: Do not have high heels. Have rubber bottoms. Are comfortable and fit you well. Are closed at the toe. Do not wear sandals. If you use a stepladder: Make sure that it is fully opened. Do not climb a closed stepladder. Make sure that  both sides of the stepladder are locked into place. Ask someone to hold it for you, if possible. Clearly mark and make sure that you can see: Any grab bars or handrails. First and last steps. Where the edge of each step is. Use tools that help you move around (mobility aids) if they are needed. These include: Canes. Walkers. Scooters. Crutches. Turn on the lights when you go into a dark area. Replace any light bulbs as soon as they burn out. Set up your furniture so you have a clear path. Avoid moving your furniture around. If any of your floors are uneven, fix them. If there are any pets around you, be aware of where they are. Review your medicines with your doctor. Some medicines can make you feel dizzy. This can increase your chance of falling. Ask your doctor what other things that you can do to help prevent falls. This information is not intended to replace advice given to you by your health care provider. Make sure you discuss any questions you have with your health care provider. Document Released: 03/29/2009 Document Revised: 11/08/2015 Document Reviewed: 07/07/2014 Elsevier Interactive Patient Education  2017 [employer].

## 2021-06-26 DIAGNOSIS — Z1211 Encounter for screening for malignant neoplasm of colon: Secondary | ICD-10-CM | POA: Diagnosis not present

## 2021-07-03 ENCOUNTER — Encounter: Payer: Self-pay | Admitting: Family Medicine

## 2021-07-03 DIAGNOSIS — L720 Epidermal cyst: Secondary | ICD-10-CM | POA: Diagnosis not present

## 2021-07-03 LAB — COLOGUARD: COLOGUARD: NEGATIVE

## 2021-08-29 DIAGNOSIS — M9903 Segmental and somatic dysfunction of lumbar region: Secondary | ICD-10-CM | POA: Diagnosis not present

## 2021-09-03 ENCOUNTER — Telehealth: Payer: Self-pay | Admitting: Family Medicine

## 2021-09-03 DIAGNOSIS — M9903 Segmental and somatic dysfunction of lumbar region: Secondary | ICD-10-CM | POA: Diagnosis not present

## 2021-09-03 NOTE — Telephone Encounter (Signed)
Pt informed to contact pharmacy for refills. 6 mo supply sent in Dec.  ?

## 2021-09-03 NOTE — Telephone Encounter (Signed)
Med refill ? ?atorvastatin ?atorvastatin (LIPITOR) 20 MG tablet ? ?[place], [place] - [address] Phone:  [phone]  ?Fax:  [phone]  ?  ? ?

## 2021-09-13 DIAGNOSIS — E890 Postprocedural hypothyroidism: Secondary | ICD-10-CM | POA: Diagnosis not present

## 2021-09-24 DIAGNOSIS — E890 Postprocedural hypothyroidism: Secondary | ICD-10-CM | POA: Diagnosis not present

## 2021-09-24 DIAGNOSIS — I10 Essential (primary) hypertension: Secondary | ICD-10-CM | POA: Diagnosis not present

## 2021-09-26 ENCOUNTER — Other Ambulatory Visit: Payer: Self-pay | Admitting: Family Medicine

## 2021-10-13 NOTE — Progress Notes (Signed)
? ?Chief Complaint  ?Patient presents with  ? Follow-up  ?  Dyspnea  ? ?History of Present Illness: 71 yo female with history of IBS, HTN, HLD, and GERD here today for cardiac follow up. She was seen as a new patient in 2013 for evaluation of chest pain. Echo in 2013 with normal LV size and function, mild LVH, mild MR. Treadmill exercise stress test on 04/02/12 with ST depression, chest pain with exercise. Exercise stress myoview 04/08/12 with no evidence of ischemia. Elevated BP with exercise. It was felt that her chest pain may be related to her HTN. Beta blocker stopped due to bradycardia and Norvasc added. I saw her in January and she c/o dyspnea with minimal exertion and substernal chest pain. Cardiac cath 06/27/15 with no evidence of CAD, normal PA pressures. Echo April 2017 with normal LV size and function, no evidence of valvular disease.  ? ?She is here today for follow up. The patient denies any chest pain, dyspnea, palpitations, lower extremity edema, orthopnea, PND, dizziness, near syncope or syncope. She is doing aerobics three days per week. She is participating in the [employer].  ? ?Primary Care Physician: [name], MD ? ?Past Medical History:  ?Diagnosis Date  ? Abdominal bruit 04/2020  ? US aorta 04/2020 NO ANEURISM  ? Allergic rhinitis   ? Colon cancer screening 07/2019  ? FIT test NEG 07/24/19 and 10/2020.  Cologuard NEG 06/2021.  ? GERD (gastroesophageal reflux disease)   ? Heart murmur, systolic   ? aortic valve area, soft, no worrisome features.  ? History of thyroidectomy, total   ? Total thyroidectomy 10/29/16, [facility], surgery done due to compressive sx's  ? Hx of cardiovascular stress test 04/09/2012  ? a. ETT + for ischemia;  b. ETT-MV: (2013) images with no ischemia, EF  77%; +ECG changes c/w ischemia.  Cath and echo normal 2017.  ? Hyperlipidemia   ? Great response to statin 2020  ? Hypertension   ? Hypothyroidism, postsurgical 10/2016  ? Dr. Elyse Hsu follows  ? IBS (irritable bowel  syndrome)   ? Osteoarthritis of both hands 2016  ? Both hands, wrists, R knee.  Rheum eval 11/2014 NEG (Dr. Trudie Reed).  Dr. Latanya Maudlin to do bilat thumb surgery when pt ready (as of 07/2018).  ? Prominent abdominal aortic pulsation 04/11/2020  ? + aortic bruit->to get u/s aorta 03/2020  ? Seizure disorder ([place]) 1977  ?  Dr Mervyn Skeeters, Neurology.One episode in 1977  ? ? ?Past Surgical History:  ?Procedure Laterality Date  ? bladder tacking  1974  ? CARDIAC CATHETERIZATION N/A 07/03/2015  ? No angiographic evidence of CAD, normal LV filling pressure and systolic fxn, no pulm HTN.  Procedure: Right/Left Heart Cath and Coronary Angiography;  Surgeon: Burnell Blanks, MD;  Location: [facility];  Service: Cardiovascular;  Laterality: N/A;  ? COLONOSCOPY  2003 & 2007  ? Dr Sharlett Iles  ? TOTAL THYROIDECTOMY  10/2016  ? Path: benign multinodular goiter (Dr. Celine Ahr, Surg onc [facility].  ? TRANSTHORACIC ECHOCARDIOGRAM  09/24/2015  ? Normal.  EF 60-65%.  ? uterine polypectomy  2008  ?  Dr Nori Riis  ? ? ?Current Outpatient Medications  ?Medication Sig Dispense Refill  ? ALPRAZolam (XANAX) 0.5 MG tablet Take 0.5 mg by mouth daily as needed for anxiety.   5  ? atorvastatin (LIPITOR) 20 MG tablet Take 1 tablet (20 mg total) by mouth daily. OFFICE VISIT NEEDED FOR FURTHER REFILLS 90 tablet 1  ? Calcium-Magnesium-Vitamin D (CALCIUM  500 PO) Take 250 tablets by mouth.    ? Cholecalciferol (VITAMIN D) 2000 units CAPS Take 2,000 Units by mouth daily.    ? FLUAD QUADRIVALENT 0.5 ML injection     ? fluticasone (FLONASE) 50 MCG/ACT nasal spray SHAKE LIQUID AND USE 2  SPRAYS IN EACH NOSTRIL  EVERY DAY 48 g 3  ? levothyroxine (SYNTHROID) 88 MCG tablet Take 1 tablet (88 mcg total) by mouth daily. Take 1 tablet 6 days a week and just 1/2 tablet 1 day a week; in AM on empty stomach for thyroid (name brand only DAW) (Patient taking differently: Take 88 mcg by mouth daily. Take 1 tablet daily.)    ? Multiple Vitamin (MULTIVITAMIN) tablet Take 1 tablet  by mouth daily.    ? nystatin-triamcinolone ointment (MYCOLOG) APPLY TOPICALLY AS NEEDED FOR SKIN IRRITATION    ? WAL-FEX ALLERGY 60 MG tablet 1/2 tab po bid for allergies 90 tablet 3  ? amLODipine (NORVASC) 10 MG tablet TAKE 1 TABLET(10 MG) BY MOUTH DAILY 90 tablet 3  ? meloxicam (MOBIC) 15 MG tablet TAKE 1 TABLET BY MOUTH  DAILY (Patient not taking: Reported on 10/14/2021) 90 tablet 0  ? ?No current facility-administered medications for this visit.  ? ? ?Allergies  ?Allergen Reactions  ? Achromycin [Tetracycline] Hives  ? Other Rash  ?  "Mycins" - hives  ? ? ?Social History  ? ?Socioeconomic History  ? Marital status: Married  ?  Spouse name: Not on file  ? Number of children: 0  ? Years of education: Not on file  ? Highest education level: Not on file  ?Occupational History  ? Occupation: Retired, works part-time now for [employer]  ?  Employer: RETIRED  ?Tobacco Use  ? Smoking status: Never  ? Smokeless tobacco: Never  ?Vaping Use  ? Vaping Use: Never used  ?Substance and Sexual Activity  ? Alcohol use: Yes  ?  Alcohol/week: 1.0 standard drink  ?  Types: 1 Standard drinks or equivalent per week  ?  Comment: Rarely  ? Drug use: No  ? Sexual activity: Not on file  ?Other Topics Concern  ? Not on file  ?Social History Narrative  ? Married, husband is pt of mine Kennady Zimmerle).  ? 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    ? No tobacco.  ? Alc: 1 drink per week.  No drugs.  ?   ?   ? ?Social Determinants of Health  ? ?Financial Resource Strain: Low Risk   ? Difficulty of Paying Living Expenses: Not hard at all  ?Food Insecurity: No Food Insecurity  ? Worried About Charity fundraiser in the Last Year: Never true  ? Ran Out of Food in the Last Year: Never true  ?Transportation Needs: No Transportation Needs  ? Lack of Transportation (Medical): No  ? Lack of Transportation (Non-Medical): No  ?Physical Activity: Sufficiently Active  ? Days of Exercise per Week: 4 days  ? Minutes of Exercise per Session: 60 min  ?Stress: No  Stress Concern Present  ? Feeling of Stress : Only a little  ?Social Connections: Socially Integrated  ? Frequency of Communication with Friends and Family: More than three times a week  ? Frequency of Social Gatherings with Friends and Family: More than three times a week  ? Attends Religious Services: More than 4 times per year  ? Active Member of Clubs or Organizations: Yes  ? Attends Archivist Meetings: 1 to 4 times per year  ?  Marital Status: Married  ?Intimate Partner Violence: Not At Risk  ? Fear of Current or Ex-Partner: No  ? Emotionally Abused: No  ? Physically Abused: No  ? Sexually Abused: No  ? ? ?Family History  ?Problem Relation Age of Onset  ? Prostate cancer Father   ? Heart attack Father 60  ? Hypertension Mother   ? Thalassemia Mother   ? Diverticulitis Brother   ? Suicidality Brother   ? Thalassemia Maternal Aunt   ? Stroke Neg Hx   ? Diabetes Neg Hx   ? ? ?Review of Systems:  As stated in the HPI and otherwise negative.  ? ?BP 136/70   Pulse (!) 55   Ht 5' 1.5" (1.562 m)   Wt 158 lb 6.4 oz (71.8 kg)   SpO2 97%   BMI 29.44 kg/m?  ? ?Physical Examination: ?General: Well developed, well nourished, NAD  ?HEENT: OP clear, mucus membranes moist  ?SKIN: warm, dry. No rashes. ?Neuro: No focal deficits  ?Musculoskeletal: Muscle strength 5/5 all ext  ?Psychiatric: Mood and affect normal  ?Neck: No JVD, no carotid bruits, no thyromegaly, no lymphadenopathy.  ?Lungs:Clear bilaterally, no wheezes, rhonci, crackles ?Cardiovascular: Regular rate and rhythm. Soft systolic murmur.  ?Abdomen:Soft. Bowel sounds present. Non-tender.  ?Extremities: No lower extremity edema. Pulses are 2 + in the bilateral DP/PT. ? ?Cardiac cath 07/03/15: ?1. No angiographic evidence of CAD ?2. Normal LV systolic function.   ?3. Normal PA pressures. ?4. Normal LV filling pressures.  ? ?Echo April 2017: ?Left ventricle: The cavity size was normal. Wall thickness was ?  normal. Systolic function was normal. The estimated  ejection ?  fraction was in the range of 60% to 65%. Wall motion was normal; ?  there were no regional wall motion abnormalities. ?- Left atrium: The atrium was mildly dilated. ? ?EKG:  EKG is ordered today

## 2021-10-14 ENCOUNTER — Ambulatory Visit: Payer: Medicare Other | Admitting: Cardiovascular Disease

## 2021-10-14 ENCOUNTER — Encounter: Payer: Self-pay | Admitting: Cardiovascular Disease

## 2021-10-14 VITALS — BP 136/70 | HR 55 | Ht 61.5 in | Wt 158.4 lb

## 2021-10-14 DIAGNOSIS — I10 Essential (primary) hypertension: Secondary | ICD-10-CM

## 2021-10-14 DIAGNOSIS — R011 Cardiac murmur, unspecified: Secondary | ICD-10-CM

## 2021-10-14 MED ORDER — AMLODIPINE BESYLATE 10 MG PO TABS: ORAL_TABLET | ORAL | 3 refills | Status: DC

## 2021-10-14 NOTE — Patient Instructions (Addendum)
Medication Instructions:  ?Your physician recommends that you continue on your current medications as directed. Please refer to the Current Medication list given to you today. ? ?*If you need a refill on your cardiac medications before your next appointment, please call your pharmacy* ? ? ?Lab Work: ?none ?If you have labs (blood work) drawn today and your tests are completely normal, you will receive your results only by: ?MyChart Message (if you have MyChart) OR ?A paper copy in the mail ?If you have any lab test that is abnormal or we need to change your treatment, we will call you to review the results. ? ? ?Testing/Procedures: ?Your physician has requested that you have an echocardiogram. Echocardiography is a painless test that uses sound waves to create images of your heart. It provides your doctor with information about the size and shape of your heart and how well your heart?s chambers and valves are working. This procedure takes approximately one hour. There are no restrictions for this procedure. ? ? ?Follow-Up: ?At [facility], you and your health needs are our priority.  As part of our continuing mission to provide you with exceptional heart care, we have created designated Provider Care Teams.  These Care Teams include your primary Cardiologist (physician) and Advanced Practice Providers (APPs -  Physician Assistants and Nurse Practitioners) who all work together to provide you with the care you need, when you need it. ? ? ?Your next appointment:   ?24 month(s) ? ?The format for your next appointment:   ?In Person ? ?Provider:   ?Lauree Chandler, MD   ? ? ?Important Information About Sugar ? ? ? ? ?  ?

## 2021-10-15 ENCOUNTER — Encounter: Payer: Medicare Other | Admitting: Family Medicine

## 2021-10-29 DIAGNOSIS — Z961 Presence of intraocular lens: Secondary | ICD-10-CM | POA: Diagnosis not present

## 2021-10-29 DIAGNOSIS — H524 Presbyopia: Secondary | ICD-10-CM | POA: Diagnosis not present

## 2021-10-31 ENCOUNTER — Ambulatory Visit (HOSPITAL_COMMUNITY): Payer: Medicare Other | Attending: Cardiovascular Disease

## 2021-10-31 DIAGNOSIS — R011 Cardiac murmur, unspecified: Secondary | ICD-10-CM | POA: Diagnosis not present

## 2021-10-31 DIAGNOSIS — I10 Essential (primary) hypertension: Secondary | ICD-10-CM | POA: Diagnosis not present

## 2021-10-31 LAB — ECHOCARDIOGRAM COMPLETE
Area-P 1/2: 3.66 cm2
S' Lateral: 2.8 cm

## 2021-11-04 ENCOUNTER — Ambulatory Visit (INDEPENDENT_AMBULATORY_CARE_PROVIDER_SITE_OTHER): Payer: Medicare Other | Admitting: Family Medicine

## 2021-11-04 ENCOUNTER — Encounter: Payer: Self-pay | Admitting: Family Medicine

## 2021-11-04 VITALS — Ht 61.75 in | Wt 159.8 lb

## 2021-11-04 DIAGNOSIS — Z0000 Encounter for general adult medical examination without abnormal findings: Secondary | ICD-10-CM

## 2021-11-04 DIAGNOSIS — I10 Essential (primary) hypertension: Secondary | ICD-10-CM

## 2021-11-04 DIAGNOSIS — E7800 Pure hypercholesterolemia, unspecified: Secondary | ICD-10-CM | POA: Diagnosis not present

## 2021-11-04 LAB — COMPREHENSIVE METABOLIC PANEL
ALT: 20 U/L (ref 0–35)
AST: 29 U/L (ref 0–37)
Albumin: 4.2 g/dL (ref 3.5–5.2)
Alkaline Phosphatase: 83 U/L (ref 39–117)
BUN: 21 mg/dL (ref 6–23)
CO2: 29 mEq/L (ref 19–32)
Calcium: 9.5 mg/dL (ref 8.4–10.5)
Chloride: 106 mEq/L (ref 96–112)
Creatinine, Ser: 0.9 mg/dL (ref 0.40–1.20)
GFR: 63.62 mL/min (ref >60.00)
Glucose, Bld: 80 mg/dL (ref 70–99)
Potassium: 4.8 mEq/L (ref 3.5–5.1)
Sodium: 141 mEq/L (ref 135–145)
Total Bilirubin: 0.6 mg/dL (ref 0.2–1.2)
Total Protein: 7 g/dL (ref 6.0–8.3)

## 2021-11-04 LAB — LIPID PANEL
Cholesterol: 156 mg/dL (ref 0–200)
HDL: 57.4 mg/dL (ref >39.00)
LDL Cholesterol: 83 mg/dL (ref 0–99)
NonHDL: 98.3
Total CHOL/HDL Ratio: 3
Triglycerides: 75 mg/dL (ref 0.0–149.0)
VLDL: 15 mg/dL (ref 0.0–40.0)

## 2021-11-04 MED ORDER — WAL-FEX ALLERGY 60 MG PO TABS: ORAL_TABLET | ORAL | 3 refills | Status: DC

## 2021-11-04 MED ORDER — ATORVASTATIN CALCIUM 20 MG PO TABS: 20.0000 mg | ORAL_TABLET | Freq: Every day | ORAL | 1 refills | Status: DC

## 2021-11-04 MED ORDER — MELOXICAM 15 MG PO TABS: 15.0000 mg | ORAL_TABLET | Freq: Every day | ORAL | 1 refills | Status: DC

## 2021-11-04 NOTE — Progress Notes (Signed)
Office Note 11/04/2021  CC:  Chief Complaint  Patient presents with   Annual Exam    Pt is fasting    HPI:  Patient is a 71 y.o. female who is here for annual health maintenance exam and f/u HTN and HLD.  She is feeling well. 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 She gets annual follow-up with her GYN MD.  She has some uterine prolapse that is giving her some symptoms more regularly now and she is considering hysterectomy.   Past Medical History:  Diagnosis Date   Abdominal bruit 04/2020   US aorta 04/2020 NO ANEURISM   Allergic rhinitis    Colon cancer screening 07/2019   FIT test NEG 07/24/19 and 10/2020.  Cologuard NEG 06/2021.   GERD (gastroesophageal reflux disease)    Heart murmur, systolic    aortic valve area, soft, no worrisome features.   History of thyroidectomy, total    Total thyroidectomy 10/29/16, [facility], surgery done due to compressive sx's   Hx of cardiovascular stress test 04/09/2012   a. ETT + for ischemia;  b. ETT-MV: (2013) images with no ischemia, EF  77%; +ECG changes c/w ischemia.  Cath and echo normal 2017.   Hyperlipidemia    Great response to statin 2020   Hypertension    Hypothyroidism, postsurgical 10/2016   Dr. Elyse Hsu follows   IBS (irritable bowel syndrome)    Osteoarthritis of both hands 2016   Both hands, wrists, R knee.  Rheum eval 11/2014 NEG (Dr. Trudie Reed).  Dr. Latanya Maudlin to do bilat thumb surgery when pt ready (as of 07/2018).   Seizure disorder [facility]) 1977    Dr Mervyn Skeeters, Neurology.One episode in 1977    Past Surgical History:  Procedure Laterality Date   BASAL CELL CARCINOMA EXCISION  03/2021   bladder tacking  1974   CARDIAC CATHETERIZATION N/A 07/03/2015   No angiographic evidence of CAD, normal LV filling pressure and systolic fxn, no pulm HTN.  Procedure: Right/Left Heart Cath and Coronary Angiography;  Surgeon: Burnell Blanks, MD;  Location: [facility];  Service:  Cardiovascular;  Laterality: N/A;   COLONOSCOPY  2003 & 2007   Dr Sharlett Iles   TOTAL THYROIDECTOMY  10/2016   Path: benign multinodular goiter (Dr. Celine Ahr, Surg onc at [facility].   TRANSTHORACIC ECHOCARDIOGRAM  09/24/2015   2017 Normal.  EF 60-65%.  10/31/21 EF 60-65%, mild MR   uterine polypectomy  2008    Dr Nori Riis    Family History  Problem Relation Age of Onset   Prostate cancer Father    Heart attack Father 20   Hypertension Mother    Thalassemia Mother    Diverticulitis Brother    Suicidality Brother    Thalassemia Maternal Aunt    Stroke Neg Hx    Diabetes Neg Hx     Social History   Socioeconomic History   Marital status: Married    Spouse name: Not on file   Number of children: 0   Years of education: Not on file   Highest education level: Not on file  Occupational History   Occupation: Retired, works part-time now for [employer]: RETIRED  Tobacco Use   Smoking status: Never   Smokeless tobacco: Never  Vaping Use   Vaping Use: Never used  Substance and Sexual Activity   Alcohol use: Yes    Alcohol/week: 1.0 standard drink    Types: 1 Standard drinks or equivalent per week  Comment: Rarely   Drug use: No   Sexual activity: Not on file  Other Topics Concern   Not on file  Social History Narrative   Married, husband is pt of mine Laban Emperor).   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     No tobacco.   Alc: 1 drink per week.  No drugs.         Social Determinants of Health   Financial Resource Strain: Low Risk    Difficulty of Paying Living Expenses: Not hard at all  Food Insecurity: No Food Insecurity   Worried About Charity fundraiser in the Last Year: Never true   [place] in the Last Year: Never true  Transportation Needs: No Transportation Needs   Lack of Transportation (Medical): No   Lack of Transportation (Non-Medical): No  Physical Activity: Sufficiently Active   Days of Exercise per Week: 4 days   Minutes of Exercise per  Session: 60 min  Stress: No Stress Concern Present   Feeling of Stress : Only a little  Social Connections: Engineer, building services of Communication with Friends and Family: More than three times a week   Frequency of Social Gatherings with Friends and Family: More than three times a week   Attends Religious Services: More than 4 times per year   Active Member of [employer] or Organizations: Yes   Attends Archivist Meetings: 1 to 4 times per year   Marital Status: Married  Human resources officer Violence: Not At Risk   Fear of Current or Ex-Partner: No   Emotionally Abused: No   Physically Abused: No   Sexually Abused: No    Outpatient Medications Prior to Visit  Medication Sig Dispense Refill   ALPRAZolam (XANAX) 0.5 MG tablet Take 0.5 mg by mouth daily as needed for anxiety.   5   amLODipine (NORVASC) 10 MG tablet TAKE 1 TABLET(10 MG) BY MOUTH DAILY 90 tablet 3   atorvastatin (LIPITOR) 20 MG tablet Take 1 tablet (20 mg total) by mouth daily. OFFICE VISIT NEEDED FOR FURTHER REFILLS 90 tablet 1   Calcium-Magnesium-Vitamin D (CALCIUM 500 PO) Take 250 tablets by mouth.     Cholecalciferol (VITAMIN D) 2000 units CAPS Take 2,000 Units by mouth daily.     fluticasone (FLONASE) 50 MCG/ACT nasal spray SHAKE LIQUID AND USE 2  SPRAYS IN EACH NOSTRIL  EVERY DAY 48 g 3   levothyroxine (SYNTHROID) 88 MCG tablet Take 1 tablet (88 mcg total) by mouth daily. Take 1 tablet 6 days a week and just 1/2 tablet 1 day a week; in AM on empty stomach for thyroid (name brand only DAW) (Patient taking differently: Take 88 mcg by mouth daily. Take 1 tablet daily.)     meloxicam (MOBIC) 15 MG tablet TAKE 1 TABLET BY MOUTH  DAILY 90 tablet 0   Multiple Vitamin (MULTIVITAMIN) tablet Take 1 tablet by mouth daily.     nystatin-triamcinolone ointment (MYCOLOG) APPLY TOPICALLY AS NEEDED FOR SKIN IRRITATION     WAL-FEX ALLERGY 60 MG tablet 1/2 tab po bid for allergies 90 tablet 3   FLUAD QUADRIVALENT 0.5 ML  injection      No facility-administered medications prior to visit.    Allergies  Allergen Reactions   Achromycin [Tetracycline] Hives   Other Rash    "Mycins" - hives   ROS Review of Systems  Constitutional:  Negative for appetite change, chills, fatigue and fever.  HENT:  Negative for congestion, dental problem,  ear pain and sore throat.   Eyes:  Negative for discharge, redness and visual disturbance.  Respiratory:  Negative for cough, chest tightness, shortness of breath and wheezing.   Cardiovascular:  Negative for chest pain, palpitations and leg swelling.  Gastrointestinal:  Negative for abdominal pain, blood in stool, diarrhea, nausea and vomiting.  Genitourinary:  Negative for difficulty urinating, dysuria, flank pain, frequency, hematuria and urgency.  Musculoskeletal:  Negative for arthralgias, back pain, joint swelling, myalgias and neck stiffness.  Skin:  Negative for pallor and rash.  Neurological:  Negative for dizziness, speech difficulty, weakness and headaches.  Hematological:  Negative for adenopathy. Does not bruise/bleed easily.  Psychiatric/Behavioral:  Negative for confusion and sleep disturbance. The patient is not nervous/anxious.    PE;    11/04/2021    9:35 AM 10/14/2021    9:11 AM 10/29/2020    9:30 AM  Vitals with BMI  Height 5' 1.75" 5' 1.5" 5' 1.5"  Weight 159 lbs 13 oz 158 lbs 6 oz 164 lbs 13 oz  BMI 29.48 05.69 79.48  Systolic  016 553  Diastolic  70 63  Pulse  55 53  Exam chaperoned by Deveron Furlong, CMA.  Gen: Alert, well appearing.  Patient is oriented to person, place, time, and situation. AFFECT: pleasant, lucid thought and speech. ENT: Ears: EACs clear, normal epithelium.  TMs with good light reflex and landmarks bilaterally.  Eyes: no injection, icteris, swelling, or exudate.  EOMI, PERRLA. Nose: no drainage or turbinate edema/swelling.  No injection or focal lesion.  Mouth: lips without lesion/swelling.  Oral mucosa pink and moist.   Dentition intact and without obvious caries or gingival swelling.  Oropharynx without erythema, exudate, or swelling.  Neck: supple/nontender.  No LAD, mass, or TM.  Carotid pulses 2+ bilaterally, without bruits. CV: RRR, no m/r/g.   LUNGS: CTA bilat, nonlabored resps, good aeration in all lung fields. ABD: soft, NT, ND, BS normal.  No hepatospenomegaly or mass.  No bruits. EXT: no clubbing, cyanosis, or edema.  Musculoskeletal: no joint swelling, erythema, warmth, or tenderness.  ROM of all joints intact. Skin - no sores or suspicious lesions or rashes or color changes  Pertinent labs:  Lab Results  Component Value Date   TSH 4.04 08/11/2019   Lab Results  Component Value Date   WBC 5.5 10/29/2020   HGB 12.3 10/29/2020   HCT 37.4 10/29/2020   MCV 87.6 10/29/2020   PLT 231 10/29/2020   Lab Results  Component Value Date   CREATININE 0.96 (H) 10/29/2020   BUN 26 (H) 10/29/2020   NA 140 10/29/2020   K 4.9 10/29/2020   CL 106 10/29/2020   CO2 24 10/29/2020   Lab Results  Component Value Date   ALT 20 10/29/2020   AST 26 10/29/2020   ALKPHOS 77 04/11/2020   BILITOT 0.6 10/29/2020   Lab Results  Component Value Date   CHOL 174 10/29/2020   Lab Results  Component Value Date   HDL 61 10/29/2020   Lab Results  Component Value Date   LDLCALC 97 10/29/2020   Lab Results  Component Value Date   TRIG 70 10/29/2020   Lab Results  Component Value Date   CHOLHDL 2.9 10/29/2020   Lab Results  Component Value Date   HGBA1C 5.3 07/21/2019   ASSESSMENT AND PLAN:   #1 hypertension, well controlled on amlodipine 10 mg a day. Electrolytes and creatinine today.  #2 Hyperlipidemia, doing well on atorvastatin 20 mg a day. Lipid panel  and hepatic panel today.  #3 Health maintenance exam: Reviewed age and gender appropriate health maintenance issues (prudent diet, regular exercise, health risks of tobacco and excessive alcohol, use of seatbelts, fire alarms in home, use of  sunscreen).  Also reviewed age and gender appropriate health screening as well as vaccine recommendations. Vaccines: all UTD. Labs: cbc, cmet, lipids Cervical ca screening: per GYN MD Breast ca screening: per GYN MD Colon ca screening: cologuard NEG 06/2021.  Rpt 2026.  An After Visit Summary was printed and given to the patient.  FOLLOW UP:  No follow-ups on file.  Signed:  Crissie Sickles, MD           11/04/2021

## 2021-11-05 ENCOUNTER — Encounter: Payer: Self-pay | Admitting: Family Medicine

## 2021-11-05 DIAGNOSIS — M2550 Pain in unspecified joint: Secondary | ICD-10-CM

## 2021-11-05 DIAGNOSIS — M159 Polyosteoarthritis, unspecified: Secondary | ICD-10-CM

## 2021-11-05 DIAGNOSIS — Z85828 Personal history of other malignant neoplasm of skin: Secondary | ICD-10-CM

## 2021-11-05 NOTE — Telephone Encounter (Signed)
Spoke with pt regarding referral; new referral placed for Dr.Hawkes( last seen 2016)

## 2021-11-05 NOTE — Telephone Encounter (Signed)
Looks like she saw Dr. Rhona Raider in orthopedics and Dr. Trudie Reed in rheumatology in the past.  It would be best to return to one of them for re-evaluation.  She may need new referral.  Diagnosis polyarthralgia and osteoarthritis multiple sites.

## 2021-11-05 NOTE — Telephone Encounter (Signed)
Pt returned your call she may be out when you call her back so please use her cell [phone]

## 2021-12-27 DIAGNOSIS — R82998 Other abnormal findings in urine: Secondary | ICD-10-CM | POA: Diagnosis not present

## 2022-01-09 DIAGNOSIS — Z85828 Personal history of other malignant neoplasm of skin: Secondary | ICD-10-CM | POA: Diagnosis not present

## 2022-01-09 DIAGNOSIS — L814 Other melanin hyperpigmentation: Secondary | ICD-10-CM | POA: Diagnosis not present

## 2022-01-09 DIAGNOSIS — L821 Other seborrheic keratosis: Secondary | ICD-10-CM | POA: Diagnosis not present

## 2022-02-20 DIAGNOSIS — E890 Postprocedural hypothyroidism: Secondary | ICD-10-CM | POA: Diagnosis not present

## 2022-02-28 ENCOUNTER — Telehealth: Payer: Self-pay

## 2022-03-10 NOTE — Telephone Encounter (Signed)
Patient refill request.    atorvastatin (LIPITOR) 20 MG tablet [[id number]]

## 2022-03-11 NOTE — Telephone Encounter (Signed)
Pt's last refill 5/22 (90,1) to [facility]. Pt was made aware. She will contact [facility] and find out status of refill. Advised to call us back once if medication still needed.

## 2022-06-02 ENCOUNTER — Other Ambulatory Visit: Payer: Self-pay | Admitting: Family Medicine

## 2022-06-12 ENCOUNTER — Telehealth: Payer: Self-pay | Admitting: Family Medicine

## 2022-06-12 NOTE — Telephone Encounter (Signed)
Spoke with patient's spouse he stated she will call back to sched [facility]

## 2022-06-24 DIAGNOSIS — M1811 Unilateral primary osteoarthritis of first carpometacarpal joint, right hand: Secondary | ICD-10-CM | POA: Diagnosis not present

## 2022-06-25 ENCOUNTER — Ambulatory Visit (INDEPENDENT_AMBULATORY_CARE_PROVIDER_SITE_OTHER): Payer: Medicare Other

## 2022-06-25 DIAGNOSIS — Z0000 Encounter for general adult medical examination without abnormal findings: Secondary | ICD-10-CM | POA: Diagnosis not present

## 2022-06-25 NOTE — Progress Notes (Signed)
Subjective:   Renee Lyons is a 72 y.o. female who presents for Medicare Annual (Subsequent) preventive examination. I connected with  Renee Lyons on 06/25/22 by a audio enabled telemedicine application and verified that I am speaking with the correct person using two identifiers.  Patient Location: Home  Provider Location: Office/Clinic  I discussed the limitations of evaluation and management by telemedicine. The patient expressed understanding and agreed to proceed.  Review of Systems    Defer to PCP       Objective:    There were no vitals filed for this visit. There is no height or weight on file to calculate BMI.     06/25/2022    2:56 PM 06/19/2021    1:21 PM 05/02/2020    1:03 PM 04/23/2018    8:59 AM 04/06/2017    8:25 AM 04/04/2016    8:26 AM 03/31/2016   10:40 PM  Advanced Directives  Does Patient Have a Medical Advance Directive? Yes Yes Yes Yes Yes Yes No  Type of Paramedic of [place];Living will Healthcare Power of [place];Living will Living will;Healthcare Power of [place];Living will [place];Living will   Does patient want to make changes to medical advance directive?      No - Patient declined   Copy of [place] in Chart?  No - copy requested No - copy requested No - copy requested No - copy requested No - copy requested     Current Medications (verified) Outpatient Encounter Medications as of 06/25/2022  Medication Sig   ALPRAZolam (XANAX) 0.5 MG tablet Take 0.5 mg by mouth daily as needed for anxiety.    amLODipine (NORVASC) 10 MG tablet TAKE 1 TABLET(10 MG) BY MOUTH DAILY   atorvastatin (LIPITOR) 20 MG tablet Take 1 tablet (20 mg total) by mouth daily.   Calcium-Magnesium-Vitamin D (CALCIUM 500 PO) Take 250 tablets by mouth.   Cholecalciferol (VITAMIN D) 2000 units CAPS Take 2,000 Units by mouth daily.   fluticasone (FLONASE) 50  MCG/ACT nasal spray SHAKE LIQUID AND USE 2  SPRAYS IN EACH NOSTRIL  EVERY DAY   levothyroxine (SYNTHROID) 88 MCG tablet Take 1 tablet (88 mcg total) by mouth daily. Take 1 tablet 6 days a week and just 1/2 tablet 1 day a week; in AM on empty stomach for thyroid (name brand only DAW) (Patient taking differently: Take 88 mcg by mouth daily. Take 1 tablet daily.)   meloxicam (MOBIC) 15 MG tablet TAKE 1 TABLET BY MOUTH DAILY   Multiple Vitamin (MULTIVITAMIN) tablet Take 1 tablet by mouth daily.   nystatin-triamcinolone ointment (MYCOLOG) APPLY TOPICALLY AS NEEDED FOR SKIN IRRITATION   PREMARIN vaginal cream SMARTSIG:1 Vaginal Daily   WAL-FEX ALLERGY 60 MG tablet 1/2 tab po bid for allergies   No facility-administered encounter medications on file as of 06/25/2022.    Allergies (verified) Achromycin [tetracycline], Erythromycin, and Other   History: Past Medical History:  Diagnosis Date   Abdominal bruit 04/2020   US aorta 04/2020 NO ANEURISM   Allergic rhinitis    Colon cancer screening 07/2019   FIT test NEG 07/24/19 and 10/2020.  Cologuard NEG 06/2021.   GERD (gastroesophageal reflux disease)    Heart murmur, systolic    aortic valve area, soft, no worrisome features.   History of thyroidectomy, total    Total thyroidectomy 10/29/16, [facility], surgery done due to compressive sx's   Hx of cardiovascular stress  test 04/09/2012   a. ETT + for ischemia;  b. ETT-MV: (2013) images with no ischemia, EF  77%; +ECG changes c/w ischemia.  Cath and echo normal 2017.   Hyperlipidemia    Great response to statin 2020   Hypertension    Hypothyroidism, postsurgical 10/2016   Dr. Elyse Hsu follows   IBS (irritable bowel syndrome)    Osteoarthritis of both hands 2016   Both hands, wrists, R knee.  Rheum eval 11/2014 NEG (Dr. Trudie Reed).  Dr. Latanya Maudlin to do bilat thumb surgery when pt ready (as of 07/2018).   Seizure disorder [facility]) 1977    Dr Mervyn Skeeters, Neurology.One episode in 1977   Past Surgical History:   Procedure Laterality Date   BASAL CELL CARCINOMA EXCISION  03/2021   bladder tacking  1974   CARDIAC CATHETERIZATION N/A 07/03/2015   No angiographic evidence of CAD, normal LV filling pressure and systolic fxn, no pulm HTN.  Procedure: Right/Left Heart Cath and Coronary Angiography;  Surgeon: Burnell Blanks, MD;  Location: [facility];  Service: Cardiovascular;  Laterality: N/A;   COLONOSCOPY  2003 & 2007   Dr Sharlett Iles   TOTAL THYROIDECTOMY  10/2016   Path: benign multinodular goiter (Dr. Celine Ahr, [facility]   TRANSTHORACIC ECHOCARDIOGRAM  09/24/2015   2017 Normal.  EF 60-65%.  10/31/21 EF 60-65%, mild MR   uterine polypectomy  2008    Dr Nori Riis   Family History  Problem Relation Age of Onset   Prostate cancer Father    Heart attack Father 19   Hypertension Mother    Thalassemia Mother    Diverticulitis Brother    Suicidality Brother    Thalassemia Maternal Aunt    Stroke Neg Hx    Diabetes Neg Hx    Social History   Socioeconomic History   Marital status: Married    Spouse name: Not on file   Number of children: 0   Years of education: Not on file   Highest education level: Not on file  Occupational History   Occupation: Retired, works part-time now for [employer]: RETIRED  Tobacco Use   Smoking status: Never   Smokeless tobacco: Never  Vaping Use   Vaping Use: Never used  Substance and Sexual Activity   Alcohol use: Yes    Alcohol/week: 1.0 standard drink of alcohol    Types: 1 Standard drinks or equivalent per week    Comment: Rarely   Drug use: No   Sexual activity: Not on file  Other Topics Concern   Not on file  Social History Narrative   Married, husband is pt of mine Renee Lyons).   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     No tobacco.   Alc: 1 drink per week.  No drugs.         Social Determinants of Health   Financial Resource Strain: Low Risk  (06/25/2022)   Overall Financial Resource Strain (CARDIA)     Difficulty of Paying Living Expenses: Not hard at all  Food Insecurity: No Food Insecurity (06/25/2022)   Hunger Vital Sign    Worried About Running Out of Food in the Last Year: Never true    Ran Out of Food in the Last Year: Never true  Transportation Needs: No Transportation Needs (06/25/2022)   PRAPARE - Hydrologist (Medical): No    Lack of Transportation (Non-Medical): No  Physical Activity: Sufficiently Active (06/25/2022)   Exercise Vital Sign  Days of Exercise per Week: 4 days    Minutes of Exercise per Session: 60 min  Stress: No Stress Concern Present (06/25/2022)   [place]    Feeling of Stress : Only a little  Social Connections: Socially Integrated (06/25/2022)   Social Connection and Isolation Panel [NHANES]    Frequency of Communication with Friends and Family: More than three times a week    Frequency of Social Gatherings with Friends and Family: More than three times a week    Attends Religious Services: More than 4 times per year    Active Member of [employer] or Organizations: Yes    Attends Archivist Meetings: 1 to 4 times per year    Marital Status: Married    Tobacco Counseling Counseling given: Not Answered   Clinical Intake:  Pre-visit preparation completed: Yes  Pain : No/denies pain     Nutritional Risks: None Diabetes: No  How often do you need to have someone help you when you read instructions, pamphlets, or other written materials from your doctor or pharmacy?: (P) 1 - Never What is the last grade level you completed in school?: Technincal school  Diabetic?NO  Interpreter Needed?: No  Information entered by :: Beyonka Pitney,CMA   Activities of Daily Living    06/25/2022    2:58 PM 06/25/2022    2:56 PM  In your present state of health, do you have any difficulty performing the following activities:  Hearing? 0 0  Vision? 0 0   Difficulty concentrating or making decisions? 0 0  Walking or climbing stairs? 0 0  Dressing or bathing? 0 0  Doing errands, shopping? 0 0  Preparing Food and eating ? N N  Using the Toilet? N N  In the past six months, have you accidently leaked urine? N N  Do you have problems with loss of bowel control? N N  Managing your Medications? N N  Managing your Finances? N N  Housekeeping or managing your Housekeeping? N N    Patient Care Team: [name], MD as PCP - General (Family Medicine) Burnell Blanks, MD as PCP - Cardiology (Cardiology) Maisie Fus, MD (Inactive) as Consulting Physician (Obstetrics and Gynecology) Rutherford Guys, MD as Consulting Physician (Ophthalmology) Randol Kern (Dentistry) Altheimer, Legrand Como, MD as Consulting Physician (Endocrinology) Fredirick Maudlin, MD as Consulting Physician (General Surgery) Loney Loh, MD (Dermatology) Melrose Nakayama, MD as Consulting Physician (Orthopedic Surgery) Williford, Layne Benton, MD as Consulting Physician (Dermatology)  Indicate any recent Medical Services you may have received from other than [facility] providers in the past year (date may be approximate).     Assessment:   This is a routine wellness examination for Renee Lyons.  Hearing/Vision screen No results found.  Dietary issues and exercise activities discussed: Current Exercise Habits: Structured exercise class, Type of exercise: stretching;Other - see comments (aerobics), Time (Minutes): 60, Frequency (Times/Week): 4, Weekly Exercise (Minutes/Week): 240, Intensity: Mild   Goals Addressed   None   Depression Screen    06/25/2022    2:54 PM 06/19/2021    1:19 PM 05/02/2020    1:04 PM 10/05/2019    8:58 AM 04/23/2018    9:03 AM 04/06/2017    8:26 AM 04/04/2016    8:27 AM  PHQ 2/9 Scores  PHQ - 2 Score 0 1 0 1 0 0 0    Fall Risk    06/25/2022    2:58 PM 06/25/2022  2:56 PM 06/25/2022    2:46 PM 06/19/2021    1:22 PM 05/02/2020    1:04 PM  Fall  Risk   Falls in the past year? 0 0 0 0 1  Number falls in past yr:  0  0 0  Injury with Fall? 0 0  0 0  Risk for fall due to :  No Fall Risks  Impaired balance/gait   Follow up  Falls evaluation completed  Falls prevention discussed Falls prevention discussed    FALL RISK PREVENTION PERTAINING TO THE HOME:  Any stairs in or around the home? Yes  If so, are there any without handrails? No  Home free of loose throw rugs in walkways, pet beds, electrical cords, etc? Yes  Adequate lighting in your home to reduce risk of falls? Yes   ASSISTIVE DEVICES UTILIZED TO PREVENT FALLS:  Life alert? No  Use of a cane, walker or w/c? No  Grab bars in the bathroom? Yes  Shower chair or bench in shower? Yes  Elevated toilet seat or a handicapped toilet? No   TIMED UP AND GO:  Was the test performed? No .  Length of time to ambulate 10 feet: 0 sec.     Cognitive Function:    04/23/2018    9:04 AM  MMSE - Mini Mental State Exam  Orientation to time 5  Orientation to Place 5  Registration 3  Attention/ Calculation 3  Recall 1  Language- name 2 objects 2  Language- repeat 1  Language- follow 3 step command 3  Language- read & follow direction 1  Write a sentence 1  Copy design 1  Total score 26        06/25/2022    2:58 PM  6CIT Screen  What time? 0 points  Count back from 20 0 points  Months in reverse 0 points  Repeat phrase 0 points    Immunizations Immunization History  Administered Date(s) Administered   Fluad Quad(high Dose 65+) 03/28/2022   Influenza, High Dose Seasonal PF 04/04/2016, 02/15/2017, 02/05/2019   Influenza,inj,Quad PF,6+ Mos 03/25/2013, 03/22/2014   Influenza-Unspecified 03/24/2015, 03/04/2018, 02/05/2019, 02/21/2020, 02/15/2021   Moderna Sars-Covid-2 Vaccination 07/07/2019, 08/12/2019, 04/13/2020, 03/15/2021   Pneumococcal Conjugate-13 02/03/2014   Pneumococcal Polysaccharide-23 04/04/2016   Tdap 02/17/2012   Zoster Recombinat (Shingrix) 03/16/2017,  07/05/2017   Zoster, Live 12/03/2011    TDAP status: Due, Education has been provided regarding the importance of this vaccine. Advised may receive this vaccine at local pharmacy or Health Dept. Aware to provide a copy of the vaccination record if obtained from local pharmacy or Health Dept. Verbalized acceptance and understanding.  Flu Vaccine status: Up to date  Pneumococcal vaccine status: Up to date  Covid-19 vaccine status: Information provided on how to obtain vaccines.   Qualifies for Shingles Vaccine? Yes   Zostavax completed  n/a   Shingrix Completed?: Yes  Screening Tests Health Maintenance  Topic Date Due   COVID-19 Vaccine (5 - 2023-24 season) 02/14/2022   DTaP/Tdap/Td (2 - Td or Tdap) 02/16/2022   MAMMOGRAM  11/05/2022 (Originally 07/05/2021)   Hepatitis C Screening  11/05/2022 (Originally 10/14/1968)   Medicare Annual Wellness (AWV)  06/26/2023   Fecal DNA (Cologuard)  06/26/2024   Pneumonia Vaccine 98+ Years old  Completed   INFLUENZA VACCINE  Completed   DEXA SCAN  Completed   Zoster Vaccines- Shingrix  Completed   HPV VACCINES  Aged Out   COLONOSCOPY (Pts 45-85yr Insurance coverage will need to be confirmed)  Discontinued    Health Maintenance  Health Maintenance Due  Topic Date Due   COVID-19 Vaccine (5 - 2023-24 season) 02/14/2022   DTaP/Tdap/Td (2 - Td or Tdap) 02/16/2022    Colorectal cancer screening: No longer required.   Mammogram status: Completed 07/05/2020. Repeat every year year. Postponed  Bone Density status: Completed 11/05/2015. Results reflect: Bone density results: OSTEOPENIA. Repeat every 0 years.  Lung Cancer Screening: (Low Dose CT Chest recommended if Age 93-80 years, 30 pack-year currently smoking OR have quit w/in 15years.) does not qualify.   Lung Cancer Screening Referral: N/A  Additional Screening:  Hepatitis C Screening: does qualify; Not Completed   Vision Screening: Recommended annual ophthalmology exams for early  detection of glaucoma and other disorders of the eye. Is the patient up to date with their annual eye exam?  Yes  Who is the provider or what is the name of the office in which the patient attends annual eye exams? Dr. Cedric Fishman  If pt is not established with a provider, would they like to be referred to a provider to establish care?  N/A .   Dental Screening: Recommended annual dental exams for proper oral hygiene  Community Resource Referral / Chronic Care Management: CRR required this visit?  No   CCM required this visit?  No      Plan:     I have personally reviewed and noted the following in the patient's chart:   Medical and social history Use of alcohol, tobacco or illicit drugs  Current medications and supplements including opioid prescriptions. Patient is not currently taking opioid prescriptions. Functional ability and status Nutritional status Physical activity Advanced directives List of other physicians Hospitalizations, surgeries, and ER visits in previous 12 months Vitals Screenings to include cognitive, depression, and falls Referrals and appointments  In addition, I have reviewed and discussed with patient certain preventive protocols, quality metrics, and best practice recommendations. A written personalized care plan for preventive services as well as general preventive health recommendations were provided to patient.     Edgar Frisk, [facility]   06/25/2022   Nurse Notes: Non Face to Face 30 minute visit.   Renee Lyons , Thank you for taking time to come for your Medicare Wellness Visit. I appreciate your ongoing commitment to your health goals. Please review the following plan we discussed and let me know if I can assist you in the future.   These are the goals we discussed:  Goals      Patient Stated     Lose 10 more      Weight (lb) < 160 lb (72.6 kg)     Lose weight by continuing to stay active.         This is a list of the screening  recommended for you and due dates:  Health Maintenance  Topic Date Due   COVID-19 Vaccine (5 - 2023-24 season) 02/14/2022   DTaP/Tdap/Td vaccine (2 - Td or Tdap) 02/16/2022   Mammogram  11/05/2022*   Hepatitis C Screening: USPSTF Recommendation to screen - Ages 18-79 yo.  11/05/2022*   Medicare Annual Wellness Visit  06/26/2023   Cologuard (Stool DNA test)  06/26/2024   Pneumonia Vaccine  Completed   Flu Shot  Completed   DEXA scan (bone density measurement)  Completed   Zoster (Shingles) Vaccine  Completed   HPV Vaccine  Aged Out   Colon Cancer Screening  Discontinued  *Topic was postponed. The date shown is not the original due date.

## 2022-06-25 NOTE — Patient Instructions (Signed)

## 2022-07-10 ENCOUNTER — Telehealth: Payer: Self-pay | Admitting: Family Medicine

## 2022-07-10 ENCOUNTER — Encounter: Payer: Self-pay | Admitting: Family Medicine

## 2022-07-10 ENCOUNTER — Ambulatory Visit (INDEPENDENT_AMBULATORY_CARE_PROVIDER_SITE_OTHER): Payer: Medicare Other | Admitting: Family Medicine

## 2022-07-10 ENCOUNTER — Ambulatory Visit: Payer: Medicare Other | Admitting: Family Medicine

## 2022-07-10 VITALS — BP 144/74 | HR 61 | Temp 98.1°F | Ht 61.75 in | Wt 159.4 lb

## 2022-07-10 DIAGNOSIS — H6992 Unspecified Eustachian tube disorder, left ear: Secondary | ICD-10-CM | POA: Diagnosis not present

## 2022-07-10 MED ORDER — NYSTATIN-TRIAMCINOLONE 100000-0.1 UNIT/GM-% EX OINT: TOPICAL_OINTMENT | CUTANEOUS | 1 refills | Status: AC

## 2022-07-10 NOTE — Telephone Encounter (Signed)
Prescription Request  07/10/2022  Is this a "Controlled Substance" medicine? No  LOV: 07/10/2022  What is the name of the medication or equipment? nystatin-triamcinolone ointment ([place])   Have you contacted your pharmacy to request a refill? Yes   Which pharmacy would you like this sent to?  [facility] [place], [place] - [address] OF [place] [address] [place] [address] Phone: [phone] Fax: [phone] Patient notified that their request is being sent to the clinical staff for review and that they should receive a response within 2 business days.   Please advise at Home [phone]

## 2022-07-10 NOTE — Telephone Encounter (Signed)
Medication was sent to the pharmacy. Pt's husband was advised.

## 2022-07-10 NOTE — Progress Notes (Signed)
OFFICE VISIT  07/10/2022  CC:  Chief Complaint  Patient presents with   Left ear concern    Pt used ear drops 2 days ago, it feels stopped up and has constant ringing.     Patient is a 72 y.o. female who presents for left ear symptoms.  HPI: L ear feels full, started 2 wks ago at the tail end of URI "Like I was in a tin can".  Claritin, saline nasal spray, flonase were used. She thought the nasal saline may have caused her sx's b/c seemed to start after using it one day.  No distinct hearing deficit.  No ear pain. Ear "tin can" sx's got better with use of otc ear drops recently but then her ear started having a ringing sensation.   URI has cleared out.   Past Medical History:  Diagnosis Date   Abdominal bruit 04/2020   US aorta 04/2020 NO ANEURISM   Allergic rhinitis    Colon cancer screening 07/2019   FIT test NEG 07/24/19 and 10/2020.  Cologuard NEG 06/2021.   GERD (gastroesophageal reflux disease)    Heart murmur, systolic    aortic valve area, soft, no worrisome features.   History of thyroidectomy, total    Total thyroidectomy 10/29/16, [facility], surgery done due to compressive sx's   Hx of cardiovascular stress test 04/09/2012   a. ETT + for ischemia;  b. ETT-MV: (2013) images with no ischemia, EF  77%; +ECG changes c/w ischemia.  Cath and echo normal 2017.   Hyperlipidemia    Great response to statin 2020   Hypertension    Hypothyroidism, postsurgical 10/2016   Dr. Elyse Hsu follows   IBS (irritable bowel syndrome)    Osteoarthritis of both hands 2016   Both hands, wrists, R knee.  Rheum eval 11/2014 NEG (Dr. Trudie Reed).  Dr. Latanya Maudlin to do bilat thumb surgery when pt ready (as of 07/2018).   Seizure disorder [facility]) 1977    Dr Mervyn Skeeters, Neurology.One episode in 1977    Past Surgical History:  Procedure Laterality Date   BASAL CELL CARCINOMA EXCISION  03/2021   bladder tacking  1974   CARDIAC CATHETERIZATION N/A 07/03/2015   No angiographic evidence of CAD, normal LV filling  pressure and systolic fxn, no pulm HTN.  Procedure: Right/Left Heart Cath and Coronary Angiography;  Surgeon: Burnell Blanks, MD;  Location: [facility];  Service: Cardiovascular;  Laterality: N/A;   COLONOSCOPY  2003 & 2007   Dr Sharlett Iles   TOTAL THYROIDECTOMY  10/2016   Path: benign multinodular goiter (Dr. Celine Ahr, [facility]   TRANSTHORACIC ECHOCARDIOGRAM  09/24/2015   2017 Normal.  EF 60-65%.  10/31/21 EF 60-65%, mild MR   uterine polypectomy  2008    Dr Nori Riis    Outpatient Medications Prior to Visit  Medication Sig Dispense Refill   amLODipine (NORVASC) 10 MG tablet TAKE 1 TABLET(10 MG) BY MOUTH DAILY 90 tablet 3   atorvastatin (LIPITOR) 20 MG tablet Take 1 tablet (20 mg total) by mouth daily. 90 tablet 1   Calcium-Magnesium-Vitamin D (CALCIUM 500 PO) Take 250 tablets by mouth.     Cholecalciferol (VITAMIN D) 2000 units CAPS Take 2,000 Units by mouth daily.     fluticasone (FLONASE) 50 MCG/ACT nasal spray SHAKE LIQUID AND USE 2  SPRAYS IN EACH NOSTRIL  EVERY DAY 48 g 3   levothyroxine (SYNTHROID) 88 MCG tablet Take 1 tablet (88 mcg total) by mouth daily. Take 1 tablet 6 days a week  and just 1/2 tablet 1 day a week; in AM on empty stomach for thyroid (name brand only DAW) (Patient taking differently: Take 88 mcg by mouth daily. Take 1 tablet daily.)     meloxicam (MOBIC) 15 MG tablet TAKE 1 TABLET BY MOUTH DAILY 90 tablet 0   Multiple Vitamin (MULTIVITAMIN) tablet Take 1 tablet by mouth daily.     WAL-FEX ALLERGY 60 MG tablet 1/2 tab po bid for allergies 90 tablet 3   nystatin-triamcinolone ointment (MYCOLOG) APPLY TOPICALLY AS NEEDED FOR SKIN IRRITATION     ALPRAZolam (XANAX) 0.5 MG tablet Take 0.5 mg by mouth daily as needed for anxiety.  (Patient not taking: Reported on 07/10/2022)  5   PREMARIN vaginal cream SMARTSIG:1 Vaginal Daily (Patient not taking: Reported on 07/10/2022)     No facility-administered medications prior to visit.    Allergies  Allergen  Reactions   Achromycin [Tetracycline] Hives   Erythromycin Rash   Other Rash    "Mycins" - hives    Review of Systems  As per HPI  PE:    07/10/2022    8:27 AM 11/04/2021    9:35 AM 10/14/2021    9:11 AM  Vitals with BMI  Height 5' 1.75" 5' 1.75" 5' 1.5"  Weight 159 lbs 6 oz 159 lbs 13 oz 158 lbs 6 oz  BMI 29.41 72.90 21.11  Systolic 552  080  Diastolic 74  70  Pulse 61  55     Physical Exam  Gen: Alert, well appearing.  Patient is oriented to person, place, time, and situation. Nose is clear. Ear canals clear and w/out swelling or erythema.  Tms with normal light reflex and w/out middle ear fluid, no TM bulging or erythema or obvious retraction.   LABS:  none  IMPRESSION AND PLAN:  Left eustachian tube dysfunction. Encouraged her to restart saline nasal irrigation and continue flonase and claritin. Forced nasal exhalation against closed nostrils encouraged periodically. She has already tried a week of sudafed and it did not help. No further sudafed recommended.  An After Visit Summary was printed and given to the patient.  FOLLOW UP: Return if symptoms worsen or fail to improve.  '@esig'$ @

## 2022-07-12 ENCOUNTER — Other Ambulatory Visit: Payer: Self-pay | Admitting: Family Medicine

## 2022-07-16 DIAGNOSIS — J3089 Other allergic rhinitis: Secondary | ICD-10-CM | POA: Diagnosis not present

## 2022-07-16 DIAGNOSIS — H6122 Impacted cerumen, left ear: Secondary | ICD-10-CM | POA: Diagnosis not present

## 2022-07-16 DIAGNOSIS — H9313 Tinnitus, bilateral: Secondary | ICD-10-CM | POA: Diagnosis not present

## 2022-07-22 DIAGNOSIS — E890 Postprocedural hypothyroidism: Secondary | ICD-10-CM | POA: Diagnosis not present

## 2022-07-22 DIAGNOSIS — M1811 Unilateral primary osteoarthritis of first carpometacarpal joint, right hand: Secondary | ICD-10-CM | POA: Diagnosis not present

## 2022-08-05 ENCOUNTER — Other Ambulatory Visit: Payer: Self-pay | Admitting: Family Medicine

## 2022-08-06 ENCOUNTER — Other Ambulatory Visit (HOSPITAL_COMMUNITY): Payer: Self-pay

## 2022-08-06 ENCOUNTER — Telehealth: Payer: Self-pay

## 2022-08-06 MED ORDER — NAFTIFINE HCL 1 % EX CREA: TOPICAL_CREAM | Freq: Every day | CUTANEOUS | 1 refills | Status: AC

## 2022-08-06 NOTE — Telephone Encounter (Signed)
Please advise on alt medication.

## 2022-08-06 NOTE — Telephone Encounter (Signed)
Pt was able to get original rx using coupon.

## 2022-08-06 NOTE — Telephone Encounter (Signed)
Pharmacy Patient Advocate Encounter   Received notification from [facility] that prior authorization for Nystatin/triamcinolone oint is required/requested.  Per Test Claim: product/service not covered. Submit ePA. Ketoconazole cr, clotrimazole, clotrimazole/betamethasone; ciclopirox; naftifine prefered   PA submitted on 08/06/22 to (ins) [employer] via CoverMyMeds Key [id number] Status is pending

## 2022-08-06 NOTE — Telephone Encounter (Signed)
Naftifine cream eRx'd

## 2022-08-08 DIAGNOSIS — E890 Postprocedural hypothyroidism: Secondary | ICD-10-CM | POA: Diagnosis not present

## 2022-08-08 NOTE — Telephone Encounter (Signed)
Noted. See message below. Pt has already picked up rx

## 2022-08-08 NOTE — Telephone Encounter (Signed)
Received a fax regarding Prior Authorization from [employer] Medicare Part D for nystatin-triamcinolone ointment. Authorization has been DENIED because Nystat/Triam oint is not FDA approved for your medical condition: skin irritation. These conditions are not supported by one of the accepted references. Therefore your drug is denied because it is not being used for a "medically accepted indication".  Phone# [phone]

## 2022-08-21 DIAGNOSIS — M25571 Pain in right ankle and joints of right foot: Secondary | ICD-10-CM | POA: Diagnosis not present

## 2022-08-21 DIAGNOSIS — M19071 Primary osteoarthritis, right ankle and foot: Secondary | ICD-10-CM | POA: Diagnosis not present

## 2022-08-21 DIAGNOSIS — M65871 Other synovitis and tenosynovitis, right ankle and foot: Secondary | ICD-10-CM | POA: Diagnosis not present

## 2022-09-01 ENCOUNTER — Other Ambulatory Visit: Payer: Self-pay

## 2022-09-01 ENCOUNTER — Other Ambulatory Visit: Payer: Self-pay | Admitting: Family Medicine

## 2022-09-01 ENCOUNTER — Other Ambulatory Visit: Payer: Self-pay | Admitting: Cardiovascular Disease

## 2022-09-01 MED ORDER — AMLODIPINE BESYLATE 10 MG PO TABS: ORAL_TABLET | ORAL | 3 refills | Status: DC

## 2022-09-14 IMAGING — US US AORTA
1 series · 11 of 11 positions shown · non-contrast
Comparison: None.

CLINICAL DATA: 69-year-old female with prominent abdominal aortic
pulsations, abdominal aortic bruit. History of hypertension,
hyperlipidemia.

EXAM:
ULTRASOUND OF ABDOMINAL AORTA
TECHNIQUE: Ultrasound examination of the abdominal aorta and proximal common
iliac arteries was performed to evaluate for aneurysm. Additional
color and Doppler images of the distal aorta were obtained to
document patency.

[Series 1: us aorta · 11 of 11 slices shown]
[im 1/11]
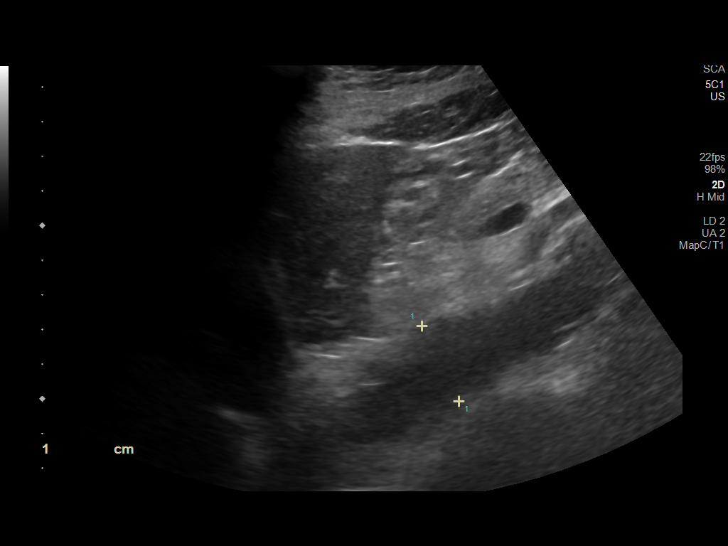
[im 2/11]
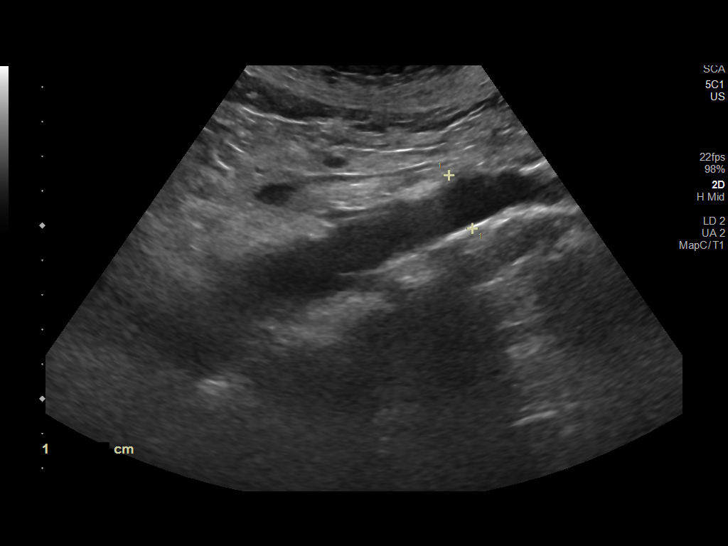
[im 3/11]
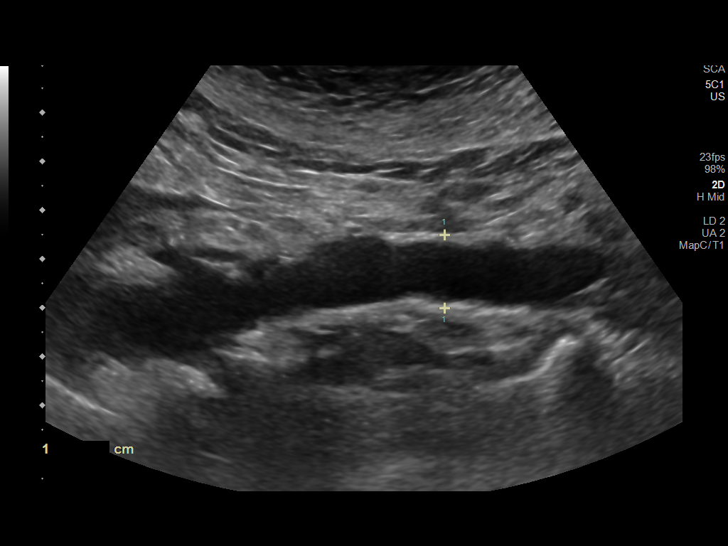
[im 4/11]
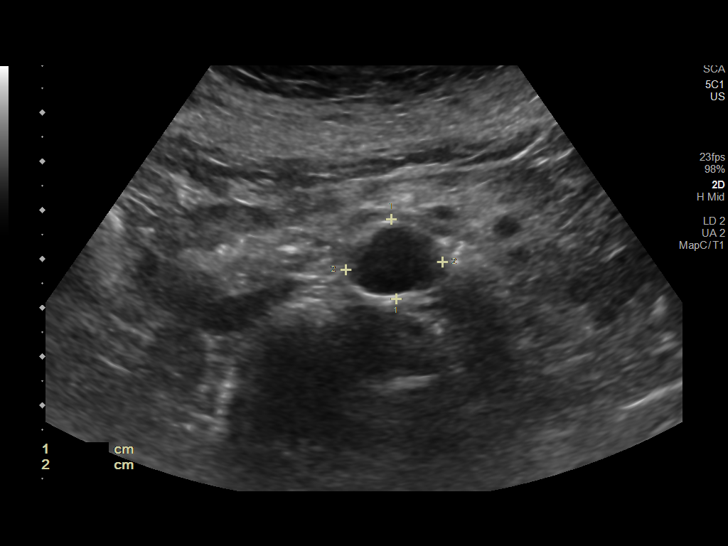
[im 5/11]
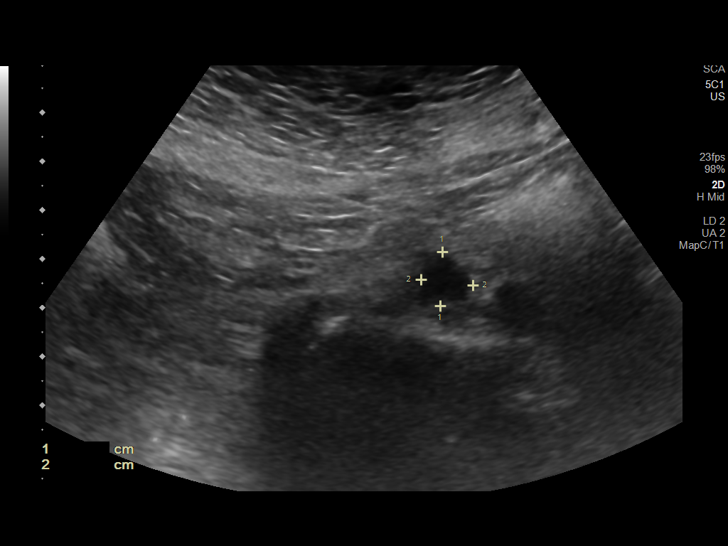
[im 6/11]
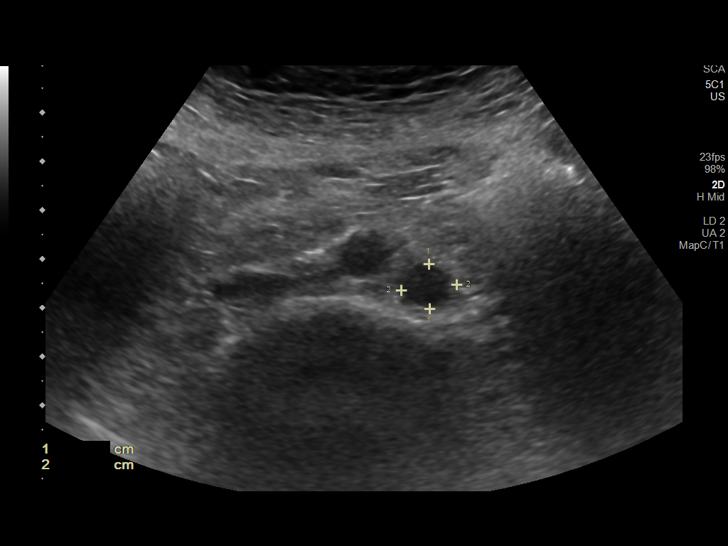
[im 7/11]
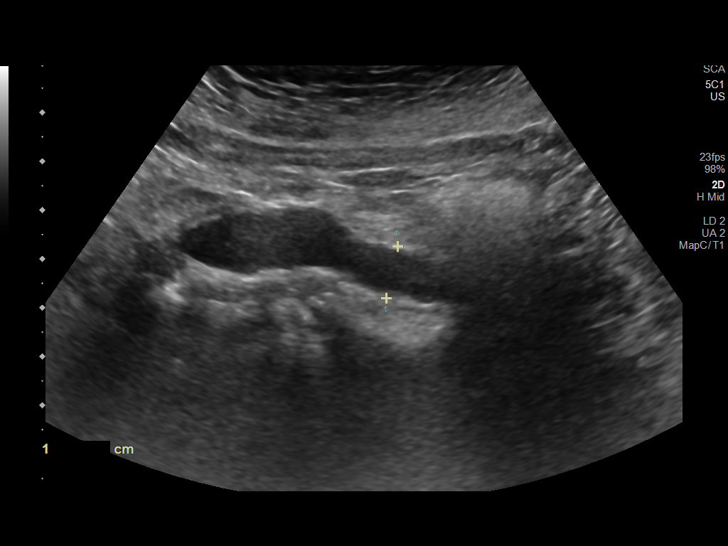
[im 8/11]
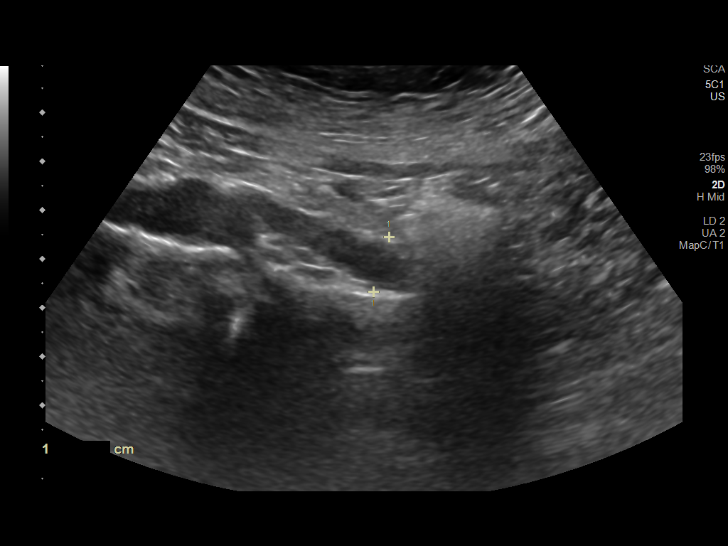
[im 9/11]
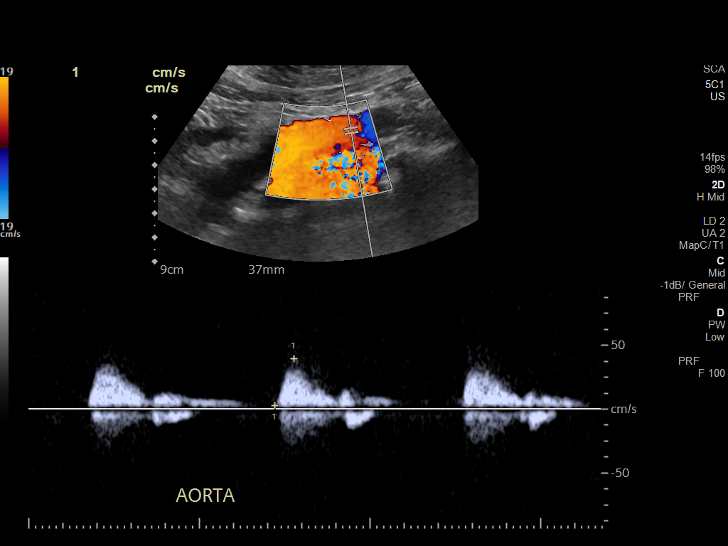
[im 10/11]
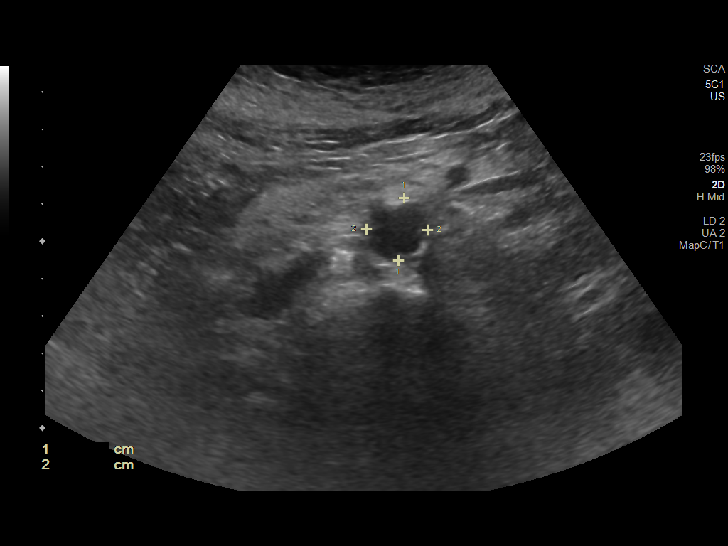
[im 11/11]
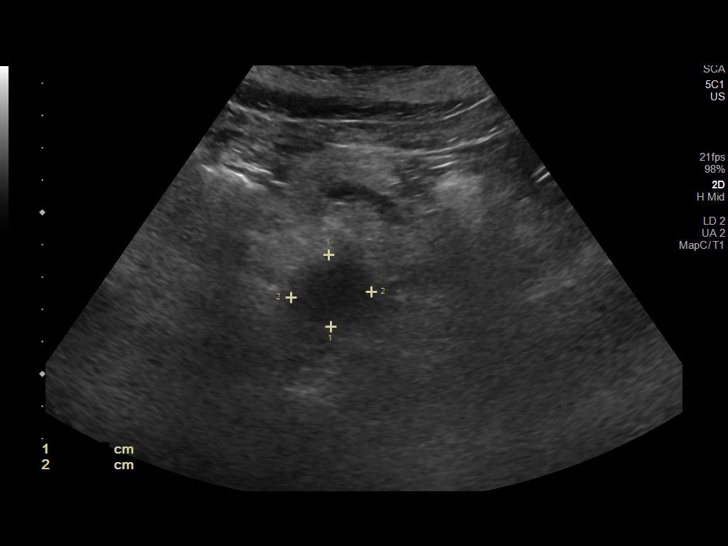

[11 of 11 positions shown; findings below may reference images not displayed]

FINDINGS: Abdominal aortic measurements as follows:

Proximal:  2.2 x 2.5 cm (AP by transverse)

Mid:         1.7 x 1.6 cm

Distal:     1.6 x 2.0 cm
Patent: Yes, peak systolic velocity is 40 cm/s

Right common iliac artery: 1.1 cm

Left common iliac artery: 1.0 cm

Other findings: Irregular aortic lumen compatible with
atherosclerosis (image 3).
IMPRESSION: Evidence of aortic atherosclerosis but negative for abdominal aortic
aneurysm.

## 2022-09-22 ENCOUNTER — Telehealth: Payer: Self-pay | Admitting: Family Medicine

## 2022-09-22 NOTE — Telephone Encounter (Signed)
Pt calls and reports her and her husband have both tested positive for Covid. I informed her I could get them both scheduled for a virtual visit tomorrow and she declined and said can't Dr. Milinda Cave just call the medication in. Please contact the patient if medication can be prescribed.

## 2022-09-22 NOTE — Telephone Encounter (Signed)
Pt advised virtual would be recommended to further discuss symptoms and proper treatment. Tested positive yesterday, re-tested today still positive. Denies fever but has scratchy throat. Covid exposure from sister in law. Agreed to schedule virtual for tomorrow.

## 2022-09-23 ENCOUNTER — Encounter: Payer: Self-pay | Admitting: Family Medicine

## 2022-09-23 ENCOUNTER — Telehealth (INDEPENDENT_AMBULATORY_CARE_PROVIDER_SITE_OTHER): Payer: Medicare Other | Admitting: Family Medicine

## 2022-09-23 VITALS — BP 136/66 | HR 62

## 2022-09-23 DIAGNOSIS — J988 Other specified respiratory disorders: Secondary | ICD-10-CM | POA: Diagnosis not present

## 2022-09-23 DIAGNOSIS — U071 COVID-19: Secondary | ICD-10-CM | POA: Diagnosis not present

## 2022-09-23 MED ORDER — NIRMATRELVIR/RITONAVIR (PAXLOVID)TABLET: 3.0000 | ORAL_TABLET | Freq: Two times a day (BID) | ORAL | 0 refills | Status: AC

## 2022-09-23 MED ORDER — HYDROCODONE BIT-HOMATROP MBR 5-1.5 MG/5ML PO SOLN: ORAL | 0 refills | Status: DC

## 2022-09-23 NOTE — Progress Notes (Signed)
Virtual Visit via Video Note  I connected with Renee Lyons  on 09/23/22 at  9:40 AM EDT by a video enabled telemedicine application and verified that I am speaking with the correct person using two identifiers.  Location patient: [place] Location provider:work or home office Persons participating in the virtual visit: patient, provider  I discussed the limitations and requested verbal permission for telemedicine visit. The patient expressed understanding and agreed to proceed.  CC: 72 year old female being seen today for respiratory symptoms.  HPI: Onset of URI symptoms with cough 3 to 4 days ago.  Mild fatigue.  No fever or shortness of breath.  No gastrointestinal symptoms. Tested COVID-positive at home 2 days ago and retest at home since then has been positive again. Husband with similar symptoms and COVID-positive test as well.  ROS: See pertinent positives and negatives per HPI.  Past Medical History:  Diagnosis Date   Abdominal bruit 04/2020   US aorta 04/2020 NO ANEURISM   Allergic rhinitis    Colon cancer screening 07/2019   FIT test NEG 07/24/19 and 10/2020.  Cologuard NEG 06/2021.   GERD (gastroesophageal reflux disease)    Heart murmur, systolic    aortic valve area, soft, no worrisome features.   History of thyroidectomy, total    Total thyroidectomy 10/29/16, [facility], surgery done due to compressive sx's   Hx of cardiovascular stress test 04/09/2012   a. ETT + for ischemia;  b. ETT-MV: (2013) images with no ischemia, EF  77%; +ECG changes c/w ischemia.  Cath and echo normal 2017.   Hyperlipidemia    Great response to statin 2020   Hypertension    Hypothyroidism, postsurgical 10/2016   Dr. Leslie Dales follows   IBS (irritable bowel syndrome)    Osteoarthritis of both hands 2016   Both hands, wrists, R knee.  Rheum eval 11/2014 NEG (Dr. Nickola Major).  Dr. Yisroel Ramming to do bilat thumb surgery when pt ready (as of 07/2018).   Seizure disorder 86    Dr Ellis Savage, Neurology.One episode in 1977     Past Surgical History:  Procedure Laterality Date   BASAL CELL CARCINOMA EXCISION  03/2021   bladder tacking  1974   CARDIAC CATHETERIZATION N/A 07/03/2015   No angiographic evidence of CAD, normal LV filling pressure and systolic fxn, no pulm HTN.  Procedure: Right/Left Heart Cath and Coronary Angiography;  Surgeon: Kathleene Hazel, MD;  Location: [facility] INVASIVE CV LAB;  Service: Cardiovascular;  Laterality: N/A;   COLONOSCOPY  2003 & 2007   Dr Jarold Motto   TOTAL THYROIDECTOMY  10/2016   Path: benign multinodular goiter (Dr. Lady Gary, Surg onc [facility]   TRANSTHORACIC ECHOCARDIOGRAM  09/24/2015   2017 Normal.  EF 60-65%.  10/31/21 EF 60-65%, mild MR   uterine polypectomy  2008    Dr Jennette Kettle     Current Outpatient Medications:    amLODipine (NORVASC) 10 MG tablet, TAKE 1 TABLET BY MOUTH DAILY, Disp: 90 tablet, Rfl: 3   atorvastatin (LIPITOR) 20 MG tablet, TAKE 1 TABLET BY MOUTH ONCE  DAILY, Disp: 90 tablet, Rfl: 1   Calcium-Magnesium-Vitamin D (CALCIUM 500 PO), Take 250 tablets by mouth., Disp: , Rfl:    Cholecalciferol (VITAMIN D) 2000 units CAPS, Take 2,000 Units by mouth daily., Disp: , Rfl:    fluticasone (FLONASE) 50 MCG/ACT nasal spray, SHAKE LIQUID AND USE 2  SPRAYS IN EACH NOSTRIL  EVERY DAY, Disp: 48 g, Rfl: 3   levothyroxine (SYNTHROID) 88 MCG tablet, Take 1 tablet (88 mcg total) by  mouth daily. Take 1 tablet 6 days a week and just 1/2 tablet 1 day a week; in AM on empty stomach for thyroid (name brand only DAW) (Patient taking differently: Take 88 mcg by mouth daily. Take 1 tablet daily.), Disp:  , Rfl:    meloxicam (MOBIC) 15 MG tablet, TAKE 1 TABLET BY MOUTH DAILY, Disp: 90 tablet, Rfl: 1   Multiple Vitamin (MULTIVITAMIN) tablet, Take 1 tablet by mouth daily., Disp: , Rfl:    naftifine (NAFTIN) 1 % cream, Apply topically daily., Disp: 30 g, Rfl: 1   nystatin-triamcinolone ointment (MYCOLOG), APPLY TOPICALLY AS NEEDED FOR SKIN IRRITATION, Disp: 30 g, Rfl: 1   WAL-FEX ALLERGY  60 MG tablet, 1/2 tab po bid for allergies, Disp: 90 tablet, Rfl: 3   ALPRAZolam (XANAX) 0.5 MG tablet, Take 0.5 mg by mouth daily as needed for anxiety.  (Patient not taking: Reported on 07/10/2022), Disp: , Rfl: 5   PREMARIN vaginal cream, SMARTSIG:1 Vaginal Daily (Patient not taking: Reported on 07/10/2022), Disp: , Rfl:   EXAM:  VITALS per patient if applicable:     09/23/2022    8:26 AM 07/10/2022    8:27 AM 11/04/2021    9:35 AM  Vitals with BMI  Height  5' 1.75" 5' 1.75"  Weight  159 lbs 6 oz 159 lbs 13 oz  BMI  29.41 29.48  Systolic 136 144   Diastolic 66 74   Pulse 62 61     GENERAL: alert, oriented, appears well and in no acute distress  HEENT: atraumatic, conjunttiva clear, no obvious abnormalities on inspection of external nose and ears  NECK: normal movements of the head and neck  LUNGS: on inspection no signs of respiratory distress, breathing rate appears normal, no obvious gross SOB, gasping or wheezing  CV: no obvious cyanosis  MS: moves all visible extremities without noticeable abnormality  PSYCH/NEURO: pleasant and cooperative, no obvious depression or anxiety, speech and thought processing grossly intact  LABS: none today   Chemistry      Component Value Date/Time   NA 141 11/04/2021 1008   K 4.8 11/04/2021 1008   CL 106 11/04/2021 1008   CO2 29 11/04/2021 1008   BUN 21 11/04/2021 1008   CREATININE 0.90 11/04/2021 1008   CREATININE 0.96 (H) 10/29/2020 1001      Component Value Date/Time   CALCIUM 9.5 11/04/2021 1008   ALKPHOS 83 11/04/2021 1008   AST 29 11/04/2021 1008   ALT 20 11/04/2021 1008   BILITOT 0.6 11/04/2021 1008     ASSESSMENT AND PLAN:  Discussed the following assessment and plan:  COVID-19 respiratory illness.  She is at high risk for complication. GFR 64 Ok for paxlovid-->rx sent Hycodan rx sent, 120 ml. Hold statin while on paxlovid.   I discussed the assessment and treatment plan with the patient. The patient was provided  an opportunity to ask questions and all were answered. The patient agreed with the plan and demonstrated an understanding of the instructions.   F/u: If not significantly improving in 3 to 4 days.  Signed:  Santiago Bumpers, MD           09/23/2022

## 2022-10-20 DIAGNOSIS — M9903 Segmental and somatic dysfunction of lumbar region: Secondary | ICD-10-CM | POA: Diagnosis not present

## 2022-10-21 DIAGNOSIS — E890 Postprocedural hypothyroidism: Secondary | ICD-10-CM | POA: Diagnosis not present

## 2022-10-30 DIAGNOSIS — Z961 Presence of intraocular lens: Secondary | ICD-10-CM | POA: Diagnosis not present

## 2022-10-30 DIAGNOSIS — H5211 Myopia, right eye: Secondary | ICD-10-CM | POA: Diagnosis not present

## 2022-10-30 DIAGNOSIS — H524 Presbyopia: Secondary | ICD-10-CM | POA: Diagnosis not present

## 2022-10-30 DIAGNOSIS — H52203 Unspecified astigmatism, bilateral: Secondary | ICD-10-CM | POA: Diagnosis not present

## 2022-11-06 DIAGNOSIS — M9903 Segmental and somatic dysfunction of lumbar region: Secondary | ICD-10-CM | POA: Diagnosis not present

## 2022-11-06 NOTE — Patient Instructions (Signed)

## 2022-11-07 ENCOUNTER — Encounter: Payer: Medicare Other | Admitting: Family Medicine

## 2022-11-13 ENCOUNTER — Encounter: Payer: Self-pay | Admitting: Family Medicine

## 2022-11-13 ENCOUNTER — Ambulatory Visit (INDEPENDENT_AMBULATORY_CARE_PROVIDER_SITE_OTHER): Payer: Medicare Other | Admitting: Family Medicine

## 2022-11-13 VITALS — BP 153/67 | HR 51 | Ht 62.0 in | Wt 163.8 lb

## 2022-11-13 DIAGNOSIS — R1013 Epigastric pain: Secondary | ICD-10-CM | POA: Diagnosis not present

## 2022-11-13 DIAGNOSIS — I10 Essential (primary) hypertension: Secondary | ICD-10-CM

## 2022-11-13 DIAGNOSIS — Z0001 Encounter for general adult medical examination with abnormal findings: Secondary | ICD-10-CM | POA: Diagnosis not present

## 2022-11-13 DIAGNOSIS — E7800 Pure hypercholesterolemia, unspecified: Secondary | ICD-10-CM | POA: Diagnosis not present

## 2022-11-13 DIAGNOSIS — Z23 Encounter for immunization: Secondary | ICD-10-CM | POA: Diagnosis not present

## 2022-11-13 DIAGNOSIS — Z1231 Encounter for screening mammogram for malignant neoplasm of breast: Secondary | ICD-10-CM

## 2022-11-13 DIAGNOSIS — Z0000 Encounter for general adult medical examination without abnormal findings: Secondary | ICD-10-CM

## 2022-11-13 LAB — COMPREHENSIVE METABOLIC PANEL
ALT: 15 U/L (ref 0–35)
AST: 25 U/L (ref 0–37)
Albumin: 4.1 g/dL (ref 3.5–5.2)
Alkaline Phosphatase: 71 U/L (ref 39–117)
BUN: 20 mg/dL (ref 6–23)
CO2: 28 mEq/L (ref 19–32)
Calcium: 9.4 mg/dL (ref 8.4–10.5)
Chloride: 106 mEq/L (ref 96–112)
Creatinine, Ser: 1.02 mg/dL (ref 0.40–1.20)
GFR: 54.36 mL/min — ABNORMAL LOW (ref >60.00)
Glucose, Bld: 84 mg/dL (ref 70–99)
Potassium: 4.7 mEq/L (ref 3.5–5.1)
Sodium: 141 mEq/L (ref 135–145)
Total Bilirubin: 0.5 mg/dL (ref 0.2–1.2)
Total Protein: 6.9 g/dL (ref 6.0–8.3)

## 2022-11-13 LAB — LIPID PANEL
Cholesterol: 167 mg/dL (ref 0–200)
HDL: 61.7 mg/dL (ref >39.00)
LDL Cholesterol: 94 mg/dL (ref 0–99)
NonHDL: 105.46
Total CHOL/HDL Ratio: 3
Triglycerides: 56 mg/dL (ref 0.0–149.0)
VLDL: 11.2 mg/dL (ref 0.0–40.0)

## 2022-11-13 LAB — CBC
HCT: 34.8 % — ABNORMAL LOW (ref 36.0–46.0)
Hemoglobin: 11.6 g/dL — ABNORMAL LOW (ref 12.0–15.0)
MCHC: 33.3 g/dL (ref 30.0–36.0)
MCV: 88.2 fl (ref 78.0–100.0)
Platelets: 214 10*3/uL (ref 150.0–400.0)
RBC: 3.94 Mil/uL (ref 3.87–5.11)
RDW: 13.5 % (ref 11.5–15.5)
WBC: 5.2 10*3/uL (ref 4.0–10.5)

## 2022-11-13 MED ORDER — WAL-FEX ALLERGY 60 MG PO TABS: ORAL_TABLET | ORAL | 3 refills | Status: DC

## 2022-11-13 MED ORDER — TETANUS-DIPHTH-ACELL PERTUSSIS 5-2.5-18.5 LF-MCG/0.5 IM SUSP
0.5000 mL | Freq: Once | INTRAMUSCULAR | 0 refills | Status: AC
Start: 2022-11-13 — End: 2022-11-13

## 2022-11-13 NOTE — Progress Notes (Signed)
Office Note 11/13/2022  CC:  Chief Complaint  Patient presents with   Annual Exam    Pt is fasting.     HPI:  Patient is a 72 y.o. female who is here for annual health maintenance exam and follow-up hypertension and hyperlipidemia.  No home bp monitoring. Says blood pressure at GYN visit last month was "elevated "but she was not told the number.  Past Medical History:  Diagnosis Date   Abdominal bruit 04/2020   US aorta 04/2020 NO ANEURISM   Allergic rhinitis    Colon cancer screening 07/2019   FIT test NEG 07/24/19 and 10/2020.  Cologuard NEG 06/2021.   GERD (gastroesophageal reflux disease)    Heart murmur, systolic    aortic valve area, soft, no worrisome features.   History of thyroidectomy, total    Total thyroidectomy 10/29/16, [facility], surgery done due to compressive sx's   Hx of cardiovascular stress test 04/09/2012   a. ETT + for ischemia;  b. ETT-MV: (2013) images with no ischemia, EF  77%; +ECG changes c/w ischemia.  Cath and echo normal 2017.   Hyperlipidemia    Great response to statin 2020   Hypertension    Hypothyroidism, postsurgical 10/2016   Dr. Leslie Dales follows   IBS (irritable bowel syndrome)    Osteoarthritis of both hands 2016   Both hands, wrists, R knee.  Rheum eval 11/2014 NEG (Dr. Nickola Major).  Dr. Yisroel Ramming to do bilat thumb surgery when pt ready (as of 07/2018).   Seizure disorder [facility]) 1977    Dr Ellis Savage, Neurology.One episode in 1977    Past Surgical History:  Procedure Laterality Date   BASAL CELL CARCINOMA EXCISION  03/2021   bladder tacking  1974   CARDIAC CATHETERIZATION N/A 07/03/2015   No angiographic evidence of CAD, normal LV filling pressure and systolic fxn, no pulm HTN.  Procedure: Right/Left Heart Cath and Coronary Angiography;  Surgeon: Kathleene Hazel, MD;  Location: [facility] INVASIVE CV LAB;  Service: Cardiovascular;  Laterality: N/A;   COLONOSCOPY  2003 & 2007   Dr Jarold Motto   TOTAL THYROIDECTOMY  10/2016   Path: benign multinodular  goiter (Dr. Lady Gary, Surg onc at [facility]   TRANSTHORACIC ECHOCARDIOGRAM  09/24/2015   2017 Normal.  EF 60-65%.  10/31/21 EF 60-65%, mild MR   uterine polypectomy  2008    Dr Jennette Kettle    Family History  Problem Relation Age of Onset   Prostate cancer Father    Heart attack Father 63   Hypertension Mother    Thalassemia Mother    Diverticulitis Brother    Suicidality Brother    Thalassemia Maternal Aunt    Stroke Neg Hx    Diabetes Neg Hx     Social History   Socioeconomic History   Marital status: Married    Spouse name: Not on file   Number of children: 0   Years of education: Not on file   Highest education level: Not on file  Occupational History   Occupation: Retired, works part-time now for [employer]: RETIRED  Tobacco Use   Smoking status: Never   Smokeless tobacco: Never  Vaping Use   Vaping Use: Never used  Substance and Sexual Activity   Alcohol use: Yes    Alcohol/week: 1.0 standard drink of alcohol    Types: 1 Standard drinks or equivalent per week    Comment: Rarely   Drug use: No   Sexual activity: Not on file  Other Topics Concern  Not on file  Social History Narrative   Married, husband is pt of mine Shoshanna Corban).   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     No tobacco.   Alc: 1 drink per week.  No drugs.         Social Determinants of Health   Financial Resource Strain: Low Risk  (06/25/2022)   Overall Financial Resource Strain (CARDIA)    Difficulty of Paying Living Expenses: Not hard at all  Food Insecurity: No Food Insecurity (06/25/2022)   Hunger Vital Sign    Worried About Running Out of Food in the Last Year: Never true    Ran Out of Food in the Last Year: Never true  Transportation Needs: No Transportation Needs (06/25/2022)   PRAPARE - Administrator, Civil Service (Medical): No    Lack of Transportation (Non-Medical): No  Physical Activity: Sufficiently Active (06/25/2022)   Exercise Vital Sign    Days of Exercise  per Week: 4 days    Minutes of Exercise per Session: 60 min  Stress: No Stress Concern Present (06/25/2022)   [employer] of Occupational Health - Occupational Stress Questionnaire    Feeling of Stress : Only a little  Social Connections: Socially Integrated (06/25/2022)   Social Connection and Isolation Panel [NHANES]    Frequency of Communication with Friends and Family: More than three times a week    Frequency of Social Gatherings with Friends and Family: More than three times a week    Attends Religious Services: More than 4 times per year    Active Member of [employer] or Organizations: Yes    Attends Banker Meetings: 1 to 4 times per year    Marital Status: Married  Catering manager Violence: Not At Risk (06/25/2022)   Humiliation, Afraid, Rape, and Kick questionnaire    Fear of Current or Ex-Partner: No    Emotionally Abused: No    Physically Abused: No    Sexually Abused: No    Outpatient Medications Prior to Visit  Medication Sig Dispense Refill   amLODipine (NORVASC) 10 MG tablet TAKE 1 TABLET BY MOUTH DAILY 90 tablet 3   atorvastatin (LIPITOR) 20 MG tablet TAKE 1 TABLET BY MOUTH ONCE  DAILY 90 tablet 1   Calcium-Magnesium-Vitamin D (CALCIUM 500 PO) Take 250 tablets by mouth.     Cholecalciferol (VITAMIN D) 2000 units CAPS Take 2,000 Units by mouth daily.     fluticasone (FLONASE) 50 MCG/ACT nasal spray SHAKE LIQUID AND USE 2  SPRAYS IN EACH NOSTRIL  EVERY DAY 48 g 3   levothyroxine (SYNTHROID) 88 MCG tablet Take 1 tablet (88 mcg total) by mouth daily. Take 1 tablet 6 days a week and just 1/2 tablet 1 day a week; in AM on empty stomach for thyroid (name brand only DAW) (Patient taking differently: Take 88 mcg by mouth daily. Take 1 tablet daily.)     meloxicam (MOBIC) 15 MG tablet TAKE 1 TABLET BY MOUTH DAILY 90 tablet 1   Multiple Vitamin (MULTIVITAMIN) tablet Take 1 tablet by mouth daily.     naftifine (NAFTIN) 1 % cream Apply topically daily. 30 g 1    nystatin-triamcinolone ointment (MYCOLOG) APPLY TOPICALLY AS NEEDED FOR SKIN IRRITATION 30 g 1   PREMARIN vaginal cream      ALPRAZolam (XANAX) 0.5 MG tablet Take 0.5 mg by mouth daily as needed for anxiety.  (Patient not taking: Reported on 07/10/2022)  5   HYDROcodone bit-homatropine (HYCODAN) 5-1.5 MG/5ML syrup  1-2 tsp po bid prn cough 120 mL 0   WAL-FEX ALLERGY 60 MG tablet 1/2 tab po bid for allergies 90 tablet 3   No facility-administered medications prior to visit.    Allergies  Allergen Reactions   Achromycin [Tetracycline] Hives   Erythromycin Rash   Other Rash    "Mycins" - hives    Review of Systems  Constitutional:  Negative for appetite change, chills, fatigue and fever.  HENT:  Negative for congestion, dental problem, ear pain and sore throat.   Eyes:  Negative for discharge, redness and visual disturbance.  Respiratory:  Negative for cough, chest tightness, shortness of breath and wheezing.   Cardiovascular:  Negative for chest pain, palpitations and leg swelling.  Gastrointestinal:  Negative for abdominal pain, blood in stool, diarrhea, nausea and vomiting.       Occ postprandial epigastric discomfort  Genitourinary:  Negative for difficulty urinating, dysuria, flank pain, frequency, hematuria and urgency.  Musculoskeletal:  Negative for arthralgias, back pain, joint swelling, myalgias and neck stiffness.  Skin:  Negative for pallor and rash.  Neurological:  Negative for dizziness, speech difficulty, weakness and headaches.  Hematological:  Negative for adenopathy. Does not bruise/bleed easily.  Psychiatric/Behavioral:  Negative for confusion and sleep disturbance. The patient is not nervous/anxious.     PE;    11/13/2022    8:05 AM 09/23/2022    8:26 AM 07/10/2022    8:27 AM  Vitals with BMI  Height 5\' 2"   5' 1.75"  Weight 163 lbs 13 oz  159 lbs 6 oz  BMI 29.95  29.41  Systolic 154 136 829  Diastolic 75 66 74  Pulse 51 62 61  Exam chaperoned by Sammuel Cooper,  CMA  Gen: Alert, well appearing.  Patient is oriented to person, place, time, and situation. AFFECT: pleasant, lucid thought and speech. ENT: Ears: EACs clear, normal epithelium.  TMs with good light reflex and landmarks bilaterally.  Eyes: no injection, icteris, swelling, or exudate.  EOMI, PERRLA. Nose: no drainage or turbinate edema/swelling.  No injection or focal lesion.  Mouth: lips without lesion/swelling.  Oral mucosa pink and moist.  Dentition intact and without obvious caries or gingival swelling.  Oropharynx without erythema, exudate, or swelling.  Neck: supple/nontender.  No LAD, mass, or TM.  Carotid pulses 2+ bilaterally, without bruits. CV: RRR, no m/r/g.   LUNGS: CTA bilat, nonlabored resps, good aeration in all lung fields. ABD: soft, NT, ND, BS normal.  No hepatospenomegaly or mass.  No bruits. EXT: no clubbing, cyanosis, or edema.  Musculoskeletal: no joint swelling, erythema, warmth, or tenderness.  ROM of all joints intact. Skin - no sores or suspicious lesions or rashes or color changes  Pertinent labs:  Lab Results  Component Value Date   TSH 4.04 08/11/2019   Lab Results  Component Value Date   WBC 5.5 10/29/2020   HGB 12.3 10/29/2020   HCT 37.4 10/29/2020   MCV 87.6 10/29/2020   PLT 231 10/29/2020   Lab Results  Component Value Date   CREATININE 0.90 11/04/2021   BUN 21 11/04/2021   NA 141 11/04/2021   K 4.8 11/04/2021   CL 106 11/04/2021   CO2 29 11/04/2021   Lab Results  Component Value Date   ALT 20 11/04/2021   AST 29 11/04/2021   ALKPHOS 83 11/04/2021   BILITOT 0.6 11/04/2021   Lab Results  Component Value Date   CHOL 156 11/04/2021   Lab Results  Component Value Date  HDL 57.40 11/04/2021   Lab Results  Component Value Date   LDLCALC 83 11/04/2021   Lab Results  Component Value Date   TRIG 75.0 11/04/2021   Lab Results  Component Value Date   CHOLHDL 3 11/04/2021   Lab Results  Component Value Date   HGBA1C 5.3  07/21/2019   ASSESSMENT AND PLAN:   #1 health maintenance exam: Reviewed age and gender appropriate health maintenance issues (prudent diet, regular exercise, health risks of tobacco and excessive alcohol, use of seatbelts, fire alarms in home, use of sunscreen).  Also reviewed age and gender appropriate health screening as well as vaccine recommendations. Vaccines: all UTD. Labs: cbc, cmet, lipids Cervical ca screening: per GYN MD Breast ca screening: per GYN MD Colon ca screening: cologuard NEG 06/2021.  Rpt 2026. Osteoporosis screening: DEXA per GYN.  #2 elevated blood pressure reading in patient with hypertension. She is hesitant to start an additional medication right now and prefers to get her home blood pressure cuff out and start checking regularly.  She will follow-up in 2 weeks here to review these with me. Goal 130/80 average reviewed with patient today. Continue amlodipine 10 mg a day. Electrolytes and creatinine today.  3.  Hypercholesterolemia, doing well on atorvastatin 20 mg a day. Last LDL was 83 about 1 yr ago. Lipid and hepatic panel today.  #4 dyspepsia. Discussed avoiding overeating, avoiding culprit foods. Trial of over-the-counter antacid or H2 blocker. Monitor for worsening.  An After Visit Summary was printed and given to the patient.  FOLLOW UP:  Return in about 2 weeks (around 11/27/2022) for Follow-up hypertension.  Signed:  Santiago Bumpers, MD           11/13/2022

## 2022-11-17 ENCOUNTER — Other Ambulatory Visit: Payer: Self-pay | Admitting: Family Medicine

## 2022-11-17 ENCOUNTER — Encounter: Payer: Self-pay | Admitting: Cardiovascular Disease

## 2022-11-17 DIAGNOSIS — I10 Essential (primary) hypertension: Secondary | ICD-10-CM

## 2022-11-17 DIAGNOSIS — D649 Anemia, unspecified: Secondary | ICD-10-CM

## 2022-11-20 NOTE — Progress Notes (Signed)
Cardiology Office Note:    Date:  11/25/2022   ID:  Renee Lyons, DOB 1951/02/11, MRN [id number]  PCP:  Renee Massed, MD  [facility] Providers Cardiologist:  Renee Carrow, MD     Referring MD: Renee Massed, MD   Chief Complaint:  Hypertension     History of Present Illness:   Renee Lyons is a 72 y.o. female with history of IBS, HTN, HLD, and GERD     She was seen as a new patient in 2013 for evaluation of chest pain. Echo in 2013 with normal LV size and function, mild LVH, mild MR. Treadmill exercise stress test on 04/02/12 with ST depression, chest pain with exercise. Exercise stress myoview 04/08/12 with no evidence of ischemia. Elevated BP with exercise. It was felt that her chest pain may be related to her HTN. Beta blocker stopped due to bradycardia and Norvasc added.  Cardiac cath 06/27/15 with no evidence of CAD, normal PA pressures. Echo April 2017 with normal LV size and function, no evidence of valvular disease.   Patient called in stating her BP has been up for the past 6 months since her hysterectomy. Patient has been checking her BP in am before she takes her meds. 150-160's in am 140's in pm. She has had cortisone shots in hand in [place] and in her foot right foot. Weight has been relatively stable. Does senior aerobics 2-3 times a week. Is getting some extra salt, eats out a lot-mexican, chinese, hot dogs. On Mobic but doesn't think it's helping.     Past Medical History:  Diagnosis Date   Abdominal bruit 04/2020   US aorta 04/2020 NO ANEURISM   Allergic rhinitis    Colon cancer screening 07/2019   FIT test NEG 07/24/19 and 10/2020.  Cologuard NEG 06/2021.   GERD (gastroesophageal reflux disease)    Heart murmur, systolic    aortic valve area, soft, no worrisome features.   History of thyroidectomy, total    Total thyroidectomy 10/29/16, [facility], surgery done due to compressive sx's   Hx of cardiovascular stress test 04/09/2012   a. ETT + for ischemia;   b. ETT-MV: (2013) images with no ischemia, EF  77%; +ECG changes c/w ischemia.  Cath and echo normal 2017.   Hyperlipidemia    Great response to statin 2020   Hypertension    Hypothyroidism, postsurgical 10/2016   Dr. Leslie Lyons follows   IBS (irritable bowel syndrome)    Osteoarthritis of both hands 2016   Both hands, wrists, R knee.  Rheum eval 11/2014 NEG (Dr. Nickola Lyons).  Dr. Yisroel Lyons to do bilat thumb surgery when pt ready (as of 07/2018).   Seizure disorder [facility]) 1977    Dr Renee Lyons, Neurology.One episode in 1977   Current Medications: Current Meds  Medication Sig   ALPRAZolam (XANAX) 0.5 MG tablet Take 0.5 mg by mouth daily as needed for anxiety.   amLODipine (NORVASC) 10 MG tablet TAKE 1 TABLET BY MOUTH DAILY   atorvastatin (LIPITOR) 20 MG tablet TAKE 1 TABLET BY MOUTH ONCE  DAILY   Calcium-Magnesium-Vitamin D (CALCIUM 500 PO) Take 250 tablets by mouth.   Cholecalciferol (VITAMIN D) 2000 units CAPS Take 2,000 Units by mouth daily.   fluticasone (FLONASE) 50 MCG/ACT nasal spray SHAKE LIQUID AND USE 2  SPRAYS IN EACH NOSTRIL  EVERY DAY   levothyroxine (SYNTHROID) 88 MCG tablet Take 1 tablet (88 mcg total) by mouth daily. Take 1 tablet 6 days a week and just 1/2  tablet 1 day a week; in AM on empty stomach for thyroid (name brand only DAW) (Patient taking differently: Take 88 mcg by mouth daily. Take 1 tablet daily.)   meloxicam (MOBIC) 15 MG tablet TAKE 1 TABLET BY MOUTH DAILY   Multiple Vitamin (MULTIVITAMIN) tablet Take 1 tablet by mouth daily.   naftifine (NAFTIN) 1 % cream Apply topically daily.   nystatin-triamcinolone ointment (MYCOLOG) APPLY TOPICALLY AS NEEDED FOR SKIN IRRITATION   PREMARIN vaginal cream    WAL-FEX ALLERGY 60 MG tablet 1/2 tab po bid for allergies    Allergies:   Achromycin [tetracycline], Erythromycin, and Other   Social History   Tobacco Use   Smoking status: Never   Smokeless tobacco: Never  Vaping Use   Vaping Use: Never used  Substance Use Topics    Alcohol use: Yes    Alcohol/week: 1.0 standard drink of alcohol    Types: 1 Standard drinks or equivalent per week    Comment: Rarely   Drug use: No    Family Hx: The patient's family history includes Diverticulitis in her brother; Heart attack (age of onset: 71) in her father; Hypertension in her mother; Prostate cancer in her father; Suicidality in her brother; Thalassemia in her maternal aunt and mother. There is no history of Stroke or Diabetes.  ROS     Physical Exam:    VS:  BP (!) 140/60   Pulse (!) 53   Ht 5\' 2"  (1.575 m)   Wt 162 lb 9.6 oz (73.8 kg)   SpO2 97%   BMI 29.74 kg/m     Wt Readings from Last 3 Encounters:  11/25/22 162 lb 9.6 oz (73.8 kg)  11/13/22 163 lb 12.8 oz (74.3 kg)  07/10/22 159 lb 6.4 oz (72.3 kg)    Physical Exam  GEN: Well nourished, well developed, in no acute distress  Neck: no JVD, carotid bruits, or masses Cardiac:RRR; no murmurs, rubs, or gallops  Respiratory:  clear to auscultation bilaterally, normal work of breathing GI: soft, nontender, nondistended, + BS Ext: without cyanosis, clubbing, or edema, Good distal pulses bilaterally Neuro:  Alert and Oriented x 3,  Psych: euthymic mood, full affect        EKGs/Labs/Other Test Reviewed:    EKG:  EKG is   ordered today.  The ekg ordered today demonstrates sinus bradycardia with poor R wave progression  Recent Labs: 11/13/2022: ALT 15; BUN 20; Creatinine, Ser 1.02; Hemoglobin 11.6; Platelets 214.0; Potassium 4.7; Sodium 141   Recent Lipid Panel Recent Labs    11/13/22 0838  CHOL 167  TRIG 56.0  HDL 61.70  VLDL 11.2  LDLCALC 94     Prior CV Studies:     Echo 10/2021 IMPRESSIONS     1. Left ventricular ejection fraction, by estimation, is 60 to 65%. The  left ventricle has normal function. The left ventricle has no regional  wall motion abnormalities. Left ventricular diastolic parameters were  normal.   2. Right ventricular systolic function is normal. The right  ventricular  size is normal. There is normal pulmonary artery systolic pressure.   3. Left atrial size was mild to moderately dilated.   4. The mitral valve is normal in structure. Mild mitral valve  regurgitation. No evidence of mitral stenosis.   5. The aortic valve is tricuspid. Aortic valve regurgitation is not  visualized. No aortic stenosis is present.   6. The inferior vena cava is normal in size with greater than 50%  respiratory variability, suggesting  right atrial pressure of 3 mmHg.    Cardiac cath 07/03/15: 1. No angiographic evidence of CAD 2. Normal LV systolic function.   3. Normal PA pressures. 4. Normal LV filling pressures.    Echo April 2017: Left ventricle: The cavity size was normal. Wall thickness was   normal. Systolic function was normal. The estimated ejection   fraction was in the range of 60% to 65%. Wall motion was normal;   there were no regional wall motion abnormalities. - Left atrium: The atrium was mildly dilated.   Risk Assessment/Calculations/Metrics:         HYPERTENSION CONTROL Vitals:   11/25/22 0835 11/25/22 0900  BP: (!) 144/78 (!) 140/60    The patient's blood pressure is elevated above target today.  In order to address the patient's elevated BP: Blood pressure will be monitored at home to determine if medication changes need to be made.; Follow up with general cardiology has been recommended.       ASSESSMENT & PLAN:   No problem-specific Assessment & Plan notes found for this encounter.   HTN BP running high but getting extra salt in her diet. She will try to adjust diet with 2 gm sodium diet, stop mobic since it's not working, send [place] readings for 2 weeks checking 2 hrs after she takes her amlodipine. If still elevated will add another medication.  History of chest pain: No recent chest pain. No evidence of CAD on cardiac cath in 2017   Mild MR-murmur minimal and no symptoms              Dispo:  No follow-ups on file.    Medication Adjustments/Labs and Tests Ordered: Current medicines are reviewed at length with the patient today.  Concerns regarding medicines are outlined above.  Tests Ordered: Orders Placed This Encounter  Procedures   EKG 12-Lead   Medication Changes: No orders of the defined types were placed in this encounter.  Elson Clan, PA-C  11/25/2022 9:12 AM    [facility] [address] [place], [place], [place]  [address] Phone: [phone]; Fax: [phone]

## 2022-11-25 ENCOUNTER — Ambulatory Visit: Payer: Medicare Other | Attending: Physician Assistant | Admitting: Physician Assistant

## 2022-11-25 ENCOUNTER — Encounter: Payer: Self-pay | Admitting: Physician Assistant

## 2022-11-25 VITALS — BP 140/60 | HR 53 | Ht 62.0 in | Wt 162.6 lb

## 2022-11-25 DIAGNOSIS — I340 Nonrheumatic mitral (valve) insufficiency: Secondary | ICD-10-CM

## 2022-11-25 DIAGNOSIS — Z87898 Personal history of other specified conditions: Secondary | ICD-10-CM | POA: Diagnosis not present

## 2022-11-25 DIAGNOSIS — I10 Essential (primary) hypertension: Secondary | ICD-10-CM | POA: Diagnosis not present

## 2022-11-25 NOTE — Patient Instructions (Addendum)
Medication Instructions:   FOR THE NEXT 2 WEEKS : TAKE BLOOD PRESSURE A HOUR AFTER TAKING AMLODIPINE SENT LOG TO [facility]   Your physician recommends that you continue on your current medications as directed. Please refer to the Current Medication list given to you today.  *If you need a refill on your cardiac medications before your next appointment, please call your pharmacy*   Lab Work:  NONE ORDERED  TODAY   If you have labs (blood work) drawn today and your tests are completely normal, you will receive your results only by: MyChart Message (if you have MyChart) OR A paper copy in the mail If you have any lab test that is abnormal or we need to change your treatment, we will call you to review the results.   Testing/Procedures: NONE ORDERED  TODAY    Follow-Up: At [facility], you and your health needs are our priority.  As part of our continuing mission to provide you with exceptional heart care, we have created designated Provider Care Teams.  These Care Teams include your primary Cardiologist (physician) and Advanced Practice Providers (APPs -  Physician Assistants and Nurse Practitioners) who all work together to provide you with the care you need, when you need it.  We recommend signing up for the patient portal called "MyChart".  Sign up information is provided on this After Visit Summary.  MyChart is used to connect with patients for Virtual Visits (Telemedicine).  Patients are able to view lab/test results, encounter notes, upcoming appointments, etc.  Non-urgent messages can be sent to your provider as well.   To learn more about what you can do with MyChart, go to [email or web address].    Your next appointment:    1 year(s)  Provider:    Verne Carrow, MD     Other Instructions Low-Sodium Eating Plan Salt (sodium) helps you keep a healthy balance of fluids in your body. Too much sodium can raise your blood pressure. It can also cause fluid and  waste to be held in your body. Your health care provider or dietitian may recommend a low-sodium eating plan if you have high blood pressure (hypertension), kidney disease, liver disease, or heart failure. Eating less sodium can help lower your blood pressure and reduce swelling. It can also protect your heart, liver, and kidneys. What are tips for following this plan? Reading food labels  Check food labels for the amount of sodium per serving. If you eat more than one serving, you must multiply the listed amount by the number of servings. Choose foods with less than 140 milligrams (mg) of sodium per serving. Avoid foods with 300 mg of sodium or more per serving. Always check how much sodium is in a product, even if the label says "unsalted" or "no salt added." Shopping  Buy products labeled as "low-sodium" or "no salt added." Buy fresh foods. Avoid canned foods and pre-made or frozen meals. Avoid canned, cured, or processed meats. Buy breads that have less than 80 mg of sodium per slice. Cooking  Eat more home-cooked food. Try to eat less restaurant, buffet, and fast food. Try not to add salt when you cook. Use salt-free seasonings or herbs instead of table salt or sea salt. Check with your provider or pharmacist before using salt substitutes. Cook with plant-based oils, such as canola, sunflower, or olive oil. Meal planning When eating at a restaurant, ask if your food can be made with less salt or no salt. Avoid dishes labeled as brined,  pickled, cured, or smoked. Avoid dishes made with soy sauce, miso, or teriyaki sauce. Avoid foods that have monosodium glutamate (MSG) in them. MSG may be added to some restaurant food, sauces, soups, bouillon, and canned foods. Make meals that can be grilled, baked, poached, roasted, or steamed. These are often made with less sodium. General information Try to limit your sodium intake to 1,500-2,300 mg each day, or the amount told by your  provider. What foods should I eat? Fruits Fresh, frozen, or canned fruit. Fruit juice. Vegetables Fresh or frozen vegetables. "No salt added" canned vegetables. "No salt added" tomato sauce and paste. Low-sodium or reduced-sodium tomato and vegetable juice. Grains Low-sodium cereals, such as oats, puffed wheat and rice, and shredded wheat. Low-sodium crackers. Unsalted rice. Unsalted pasta. Low-sodium bread. Whole grain breads and whole grain pasta. Meats and other proteins Fresh or frozen meat, poultry, seafood, and fish. These should have no added salt. Low-sodium canned tuna and salmon. Unsalted nuts. Dried peas, beans, and lentils without added salt. Unsalted canned beans. Eggs. Unsalted nut butters. Dairy Milk. Soy milk. Cheese that is naturally low in sodium, such as ricotta cheese, fresh mozzarella, or Swiss cheese. Low-sodium or reduced-sodium cheese. Cream cheese. Yogurt. Seasonings and condiments Fresh and dried herbs and spices. Salt-free seasonings. Low-sodium mustard and ketchup. Sodium-free salad dressing. Sodium-free light mayonnaise. Fresh or refrigerated horseradish. Lemon juice. Vinegar. Other foods Homemade, reduced-sodium, or low-sodium soups. Unsalted popcorn and pretzels. Low-salt or salt-free chips. The items listed above may not be all the foods and drinks you can have. Talk to a dietitian to learn more. What foods should I avoid? Vegetables Sauerkraut, pickled vegetables, and relishes. Olives. [place] fries. Onion rings. Regular canned vegetables, except low-sodium or reduced-sodium items. Regular canned tomato sauce and paste. Regular tomato and vegetable juice. Frozen vegetables in sauces. Grains Instant hot cereals. Bread stuffing, pancake, and biscuit mixes. Croutons. Seasoned rice or pasta mixes. Noodle soup cups. Boxed or frozen macaroni and cheese. Regular salted crackers. Self-rising flour. Meats and other proteins Meat or fish that is salted, canned, smoked,  spiced, or pickled. Precooked or cured meat, such as sausages or meat loaves. Tomasa Blase. Ham. Pepperoni. Hot dogs. Corned beef. Chipped beef. Salt pork. Jerky. Pickled herring, anchovies, and sardines. Regular canned tuna. Salted nuts. Dairy Processed cheese and cheese spreads. Hard cheeses. Cheese curds. Blue cheese. Feta cheese. String cheese. Regular cottage cheese. Buttermilk. Canned milk. Fats and oils Salted butter. Regular margarine. Ghee. Bacon fat. Seasonings and condiments Onion salt, garlic salt, seasoned salt, table salt, and sea salt. Canned and packaged gravies. Worcestershire sauce. Tartar sauce. Barbecue sauce. Teriyaki sauce. Soy sauce, including reduced-sodium soy sauce. Steak sauce. Fish sauce. Oyster sauce. Cocktail sauce. Horseradish that you find on the shelf. Regular ketchup and mustard. Meat flavorings and tenderizers. Bouillon cubes. Hot sauce. Pre-made or packaged marinades. Pre-made or packaged taco seasonings. Relishes. Regular salad dressings. Salsa. Other foods Salted popcorn and pretzels. Corn chips and puffs. Potato and tortilla chips. Canned or dried soups. Pizza. Frozen entrees and pot pies. The items listed above may not be all the foods and drinks you should avoid. Talk to a dietitian to learn more. This information is not intended to replace advice given to you by your health care provider. Make sure you discuss any questions you have with your health care provider. Document Revised: 06/19/2022 Document Reviewed: 06/19/2022 Elsevier Patient Education  2024 [employer].

## 2022-11-26 ENCOUNTER — Other Ambulatory Visit: Payer: Self-pay | Admitting: Family Medicine

## 2022-11-26 ENCOUNTER — Telehealth: Payer: Self-pay | Admitting: Family Medicine

## 2022-11-26 NOTE — Telephone Encounter (Signed)
Renee Lyons called to report she received a message from [facility] that her prescription is expiring for atorvastatin (LIPITOR) 20 MG tablet and she will need a new prescription. She is not out of medication and has enough for a couple more weeks.

## 2022-11-26 NOTE — Telephone Encounter (Signed)
Medication requested was refilled 3/18 for a 6 month supply (#90 with 1 refill), she should not need a new prescription from [place]. She is due for 2 week BP follow up per provider recommendations. She had a scheduled appt for 6/18 but cancelled. Please see if we can get her rescheduled.

## 2022-11-27 ENCOUNTER — Other Ambulatory Visit: Payer: Self-pay

## 2022-11-27 ENCOUNTER — Encounter: Payer: Self-pay | Admitting: Obstetrics & Gynecology

## 2022-11-27 NOTE — Telephone Encounter (Signed)
I tried to get the patient rescheduled but she denied at time of phone call. She stated she say her heart Dr and they said the same thing.  She did not want to schedule although I informed her this was for her BP.

## 2022-11-27 NOTE — Telephone Encounter (Signed)
LVM for pt regarding refill. She should have 1 refill available that should last until September.

## 2022-11-28 MED ORDER — ATORVASTATIN CALCIUM 20 MG PO TABS: 20.0000 mg | ORAL_TABLET | Freq: Every day | ORAL | 1 refills | Status: DC

## 2022-11-28 NOTE — Telephone Encounter (Signed)
Spoke with pharmacist at [facility], partial refill was given. Insurance does allow 100 day supply. Note to pharmacy, "only fill for 90 day". Pt would need new rx sent to receive full 90 day supply.

## 2022-11-28 NOTE — Telephone Encounter (Signed)
Pt's husband advised refill sent to mail order pharmacy, he will inform wife.

## 2022-12-01 MED ORDER — LOSARTAN POTASSIUM 25 MG PO TABS: 25.0000 mg | ORAL_TABLET | Freq: Every day | ORAL | 3 refills | Status: DC

## 2022-12-01 NOTE — Telephone Encounter (Signed)
Pam, can you let her know we'll start losartan 25 mg once daily. Can you please send it in to her pharmacy? She had a recent bmet that was good but will need another in 1 week. Thanks, Jari Pigg patient with Michele's message. Patient agreed to plan and will give our office a call or mychart a message if she has any other concerns.

## 2022-12-02 ENCOUNTER — Ambulatory Visit: Payer: Medicare Other | Admitting: Family Medicine

## 2022-12-08 ENCOUNTER — Ambulatory Visit: Payer: Medicare Other | Attending: Physician Assistant

## 2022-12-08 DIAGNOSIS — I10 Essential (primary) hypertension: Secondary | ICD-10-CM | POA: Diagnosis not present

## 2022-12-09 ENCOUNTER — Telehealth: Payer: Self-pay

## 2022-12-09 DIAGNOSIS — I10 Essential (primary) hypertension: Secondary | ICD-10-CM

## 2022-12-09 LAB — BASIC METABOLIC PANEL
BUN/Creatinine Ratio: 19 (ref 12–28)
BUN: 17 mg/dL (ref 8–27)
CO2: 24 mmol/L (ref 20–29)
Calcium: 9.4 mg/dL (ref 8.7–10.3)
Chloride: 106 mmol/L (ref 96–106)
Creatinine, Ser: 0.91 mg/dL (ref 0.57–1.00)
Glucose: 86 mg/dL (ref 70–99)
Potassium: 4.7 mmol/L (ref 3.5–5.2)
Sodium: 140 mmol/L (ref 134–144)
eGFR: 66 mL/min/{1.73_m2} (ref >59)

## 2022-12-09 MED ORDER — LOSARTAN POTASSIUM 50 MG PO TABS: 50.0000 mg | ORAL_TABLET | Freq: Every day | ORAL | 3 refills | Status: DC

## 2022-12-09 NOTE — Telephone Encounter (Signed)
-----   Message from Dyann Kief, PA-C sent at 12/09/2022  9:57 AM EDT ----- Please ask her if she's following a low salt diet. Can increase losartan 50 mg once daily-maybe take at night and amlodipine in am. Bmet in 2 weeks. thanks ----- Message ----- From: Roseanne Reno, CMA Sent: 12/09/2022   8:15 AM EDT To: Dyann Kief, PA-C  Patient notified.  Patient BP: 6/21- 131/56 6/23 (PM)- 159/69 6/24- 128/67

## 2022-12-09 NOTE — Telephone Encounter (Signed)
Patient notified and verbalized understanding. 

## 2022-12-23 ENCOUNTER — Other Ambulatory Visit (INDEPENDENT_AMBULATORY_CARE_PROVIDER_SITE_OTHER): Payer: Medicare Other

## 2022-12-23 DIAGNOSIS — D649 Anemia, unspecified: Secondary | ICD-10-CM | POA: Diagnosis not present

## 2022-12-23 LAB — CBC
HCT: 34.1 % — ABNORMAL LOW (ref 36.0–46.0)
Hemoglobin: 11.4 g/dL — ABNORMAL LOW (ref 12.0–15.0)
MCHC: 33.4 g/dL (ref 30.0–36.0)
MCV: 88 fl (ref 78.0–100.0)
Platelets: 205 10*3/uL (ref 150.0–400.0)
RBC: 3.87 Mil/uL (ref 3.87–5.11)
RDW: 13.3 % (ref 11.5–15.5)
WBC: 5.1 10*3/uL (ref 4.0–10.5)

## 2022-12-23 LAB — VITAMIN B12: Vitamin B-12: 355 pg/mL (ref 211–911)

## 2022-12-23 NOTE — Progress Notes (Signed)
Pt came for labs only, tolerated draw well.   

## 2022-12-24 LAB — IRON,TIBC AND FERRITIN PANEL
%SAT: 34 % (calc) (ref 16–45)
Ferritin: 95 ng/mL (ref 16–288)
Iron: 82 ug/dL (ref 45–160)
TIBC: 239 mcg/dL (calc) — ABNORMAL LOW (ref 250–450)

## 2023-01-05 MED ORDER — LOSARTAN POTASSIUM 100 MG PO TABS: 100.0000 mg | ORAL_TABLET | Freq: Every day | ORAL | 3 refills | Status: DC

## 2023-01-12 ENCOUNTER — Ambulatory Visit: Payer: Medicare Other | Attending: Physician Assistant

## 2023-01-12 DIAGNOSIS — I10 Essential (primary) hypertension: Secondary | ICD-10-CM | POA: Diagnosis not present

## 2023-02-05 ENCOUNTER — Other Ambulatory Visit: Payer: Self-pay | Admitting: Family Medicine

## 2023-02-12 DIAGNOSIS — D239 Other benign neoplasm of skin, unspecified: Secondary | ICD-10-CM | POA: Diagnosis not present

## 2023-02-12 DIAGNOSIS — L821 Other seborrheic keratosis: Secondary | ICD-10-CM | POA: Diagnosis not present

## 2023-02-12 DIAGNOSIS — L820 Inflamed seborrheic keratosis: Secondary | ICD-10-CM | POA: Diagnosis not present

## 2023-02-18 DIAGNOSIS — M9903 Segmental and somatic dysfunction of lumbar region: Secondary | ICD-10-CM | POA: Diagnosis not present

## 2023-03-17 DIAGNOSIS — E890 Postprocedural hypothyroidism: Secondary | ICD-10-CM | POA: Diagnosis not present

## 2023-03-17 DIAGNOSIS — I10 Essential (primary) hypertension: Secondary | ICD-10-CM | POA: Diagnosis not present

## 2023-04-01 DIAGNOSIS — E890 Postprocedural hypothyroidism: Secondary | ICD-10-CM | POA: Diagnosis not present

## 2023-04-23 ENCOUNTER — Other Ambulatory Visit: Payer: Self-pay | Admitting: Family Medicine

## 2023-05-08 ENCOUNTER — Ambulatory Visit (INDEPENDENT_AMBULATORY_CARE_PROVIDER_SITE_OTHER): Payer: Medicare Other | Admitting: Urgent Care

## 2023-05-08 ENCOUNTER — Encounter: Payer: Self-pay | Admitting: Urgent Care

## 2023-05-08 VITALS — BP 148/78 | HR 58 | Temp 98.3°F | Wt 160.4 lb

## 2023-05-08 DIAGNOSIS — H0011 Chalazion right upper eyelid: Secondary | ICD-10-CM | POA: Diagnosis not present

## 2023-05-08 DIAGNOSIS — L03213 Periorbital cellulitis: Secondary | ICD-10-CM | POA: Diagnosis not present

## 2023-05-08 MED ORDER — AMOXICILLIN-POT CLAVULANATE 875-125 MG PO TABS
1.0000 | ORAL_TABLET | Freq: Two times a day (BID) | ORAL | 0 refills | Status: AC
Start: 2023-05-08 — End: 2023-05-13

## 2023-05-08 MED ORDER — BACITRACIN-POLYMYXIN B 500-10000 UNIT/GM OP OINT
1.0000 | TOPICAL_OINTMENT | Freq: Two times a day (BID) | OPHTHALMIC | 0 refills | Status: AC
Start: 2023-05-08 — End: ?

## 2023-05-08 NOTE — Patient Instructions (Addendum)
Read the attached handouts regarding your eyelid conditions.  Please avoid touching your eye; if you do, [facility] YOUR HANDS! I have prescribed polymixin eye ointment. Use this on your right eye twice daily x 5 days. If the ointment is too hard to administer, hold it in your warm hands for 5 minutes prior and it will act more like a drop. Warm moist washcloths with Laural Benes and Johnson baby shampoo should be used to gently massage and cleans to eyes prior to each ointment administration.  Please take the augmentin antibiotic twice daily with food. Take for all 5 days.  Return to clinic if you develop any fever, eye pain, change in vision, or worsening swelling.

## 2023-05-08 NOTE — Progress Notes (Signed)
Established Patient Office Visit  Subjective:  Patient ID: Renee Lyons, female    DOB: 11-17-1950  Age: 73 y.o. MRN: [id number]  Chief Complaint  Patient presents with   Eye Pain    Right eye pain that started earlier this week. She states this morning it was swollen and she had some ocular discharge.    Eye Pain  The right eye is affected. This is a new problem. The current episode started in the past 7 days (started several days ago with what felt like a stye, eyelid swelling and redness started yesterday). The problem has been gradually worsening. There was no injury mechanism (pt worked out in the yard this past weekend, but denied injury or FB. Pt does not wear contact lenses).    Patient Active Problem List   Diagnosis Date Noted   Postsurgical hypothyroidism 01/08/2017   Dyspnea    History of basal cell cancer 03/28/2014   Health maintenance examination 02/03/2014   Cough 10/12/2013   Sinusitis, acute maxillary 11/29/2012   Tinnitus 07/05/2010   SEIZURE DISORDER 07/05/2010   HYPERLIPIDEMIA 03/15/2008   GERD 03/15/2008   ANXIETY DISORDER, MILD, HX OF 03/15/2008   PALPITATIONS 11/16/2007   UNSPECIFIED ANEMIA 08/20/2007   GOITER NOS 03/09/2007   ALLERGIC RHINITIS 03/09/2007   Past Medical History:  Diagnosis Date   Abdominal bruit 04/2020   US aorta 04/2020 NO ANEURISM   Allergic rhinitis    Colon cancer screening 07/2019   FIT test NEG 07/24/19 and 10/2020.  Cologuard NEG 06/2021.   GERD (gastroesophageal reflux disease)    Heart murmur, systolic    aortic valve area, soft, no worrisome features.   History of thyroidectomy, total    Total thyroidectomy 10/29/16, [facility], surgery done due to compressive sx's   Hx of cardiovascular stress test 04/09/2012   a. ETT + for ischemia;  b. ETT-MV: (2013) images with no ischemia, EF  77%; +ECG changes c/w ischemia.  Cath and echo normal 2017.   Hyperlipidemia    Great response to statin 2020   Hypertension    Hypothyroidism,  postsurgical 10/2016   Dr. Leslie Dales follows   IBS (irritable bowel syndrome)    Osteoarthritis of both hands 2016   Both hands, wrists, R knee.  Rheum eval 11/2014 NEG (Dr. Nickola Major).  Dr. Yisroel Ramming to do bilat thumb surgery when pt ready (as of 07/2018).   Seizure disorder [facility]) 1977    Dr Ellis Savage, Neurology.One episode in 1977   Social History   Tobacco Use   Smoking status: Never   Smokeless tobacco: Never  Vaping Use   Vaping status: Never Used  Substance Use Topics   Alcohol use: Yes    Alcohol/week: 1.0 standard drink of alcohol    Types: 1 Standard drinks or equivalent per week    Comment: Rarely   Drug use: No      ROS: as noted in HPI  Objective:     BP (!) 148/78   Pulse (!) 58   Temp 98.3 F (36.8 C) (Oral)   Wt 160 lb 6.4 oz (72.8 kg)   SpO2 100%   BMI 29.34 kg/m  BP Readings from Last 3 Encounters:  05/08/23 (!) 148/78  11/25/22 (!) 140/60  11/13/22 (!) 153/67   Wt Readings from Last 3 Encounters:  05/08/23 160 lb 6.4 oz (72.8 kg)  11/25/22 162 lb 9.6 oz (73.8 kg)  11/13/22 163 lb 12.8 oz (74.3 kg)      Physical Exam Vitals and  nursing note reviewed.  Constitutional:      General: She is not in acute distress.    Appearance: Normal appearance. She is not ill-appearing, toxic-appearing or diaphoretic.  HENT:     Head: Normocephalic and atraumatic.     Right Ear: External ear normal.     Nose: Nose normal.     Mouth/Throat:     Mouth: Mucous membranes are moist.     Pharynx: Oropharynx is clear.  Eyes:     General: Vision grossly intact. Gaze aligned appropriately. No allergic shiner, visual field deficit or scleral icterus.       Right eye: Hordeolum present. No foreign body or discharge.        Left eye: No foreign body, discharge or hordeolum.     Extraocular Movements: Extraocular movements intact.     Right eye: Normal extraocular motion and no nystagmus.     Left eye: Normal extraocular motion and no nystagmus.     Conjunctiva/sclera:      Right eye: Right conjunctiva is not injected. No chemosis, exudate or hemorrhage.    Left eye: Left conjunctiva is not injected. No chemosis, exudate or hemorrhage.    Pupils: Pupils are equal, round, and reactive to light.     Visual Fields: Right eye visual fields normal and left eye visual fields normal.   Neurological:     Mental Status: She is alert.      No results found for any visits on 05/08/23.    The 10-year ASCVD risk score (Arnett DK, et al., 2019) is: 24.1%  Assessment & Plan:  Chalazion of right upper eyelid -     Amoxicillin-Pot Clavulanate; Take 1 tablet by mouth 2 (two) times daily with a meal for 5 days.  Dispense: 10 tablet; Refill: 0 -     Bacitracin-Polymyxin B; Place 1 Application into the right eye every 12 (twelve) hours. apply to eye every 12 hours while awake  Dispense: 3.5 g; Refill: 0  Preseptal cellulitis of right upper eyelid -     Amoxicillin-Pot Clavulanate; Take 1 tablet by mouth 2 (two) times daily with a meal for 5 days.  Dispense: 10 tablet; Refill: 0 -     Bacitracin-Polymyxin B; Place 1 Application into the right eye every 12 (twelve) hours. apply to eye every 12 hours while awake  Dispense: 3.5 g; Refill: 0  Please avoid touching your eye; if you do, [facility] YOUR HANDS! I have prescribed polymixin eye ointment. Use this on your right eye twice daily x 5 days. If the ointment is too hard to administer, hold it in your warm hands for 5 minutes prior and it will act more like a drop. Warm moist washcloths with Laural Benes and Johnson baby shampoo should be used to gently massage and cleans to eyes prior to each ointment administration.  Please take the augmentin antibiotic twice daily with food. Take for all 5 days.  Return to clinic if you develop any fever, eye pain, change in vision, or worsening swelling.  No follow-ups on file.   Maretta Bees, PA

## 2023-05-16 ENCOUNTER — Other Ambulatory Visit: Payer: Self-pay | Admitting: Family Medicine

## 2023-07-01 ENCOUNTER — Ambulatory Visit: Payer: Medicare Other | Admitting: *Deleted

## 2023-07-01 DIAGNOSIS — Z0000 Encounter for general adult medical examination without abnormal findings: Secondary | ICD-10-CM | POA: Diagnosis not present

## 2023-07-01 NOTE — Progress Notes (Signed)
Subjective:   Renee Lyons is a 73 y.o. female who presents for Medicare Annual (Subsequent) preventive examination.  Visit Complete: Virtual I connected with  Renee Lyons on 07/01/23 by a audio enabled telemedicine application and verified that I am speaking with the correct person using two identifiers.  Patient Location: Home  Provider Location: Home Office  I discussed the limitations of evaluation and management by telemedicine. The patient expressed understanding and agreed to proceed.  Vital Signs: Because this visit was a virtual/telehealth visit, some criteria may be missing or patient reported. Any vitals not documented were not able to be obtained and vitals that have been documented are patient reported.  Unable to connect video.  Cardiac Risk Factors include: advanced age (>44men, >66 women)     Objective:    There were no vitals filed for this visit. There is no height or weight on file to calculate BMI.     07/01/2023    3:15 PM 06/25/2022    2:56 PM 06/19/2021    1:21 PM 05/02/2020    1:03 PM 04/23/2018    8:59 AM 04/06/2017    8:25 AM 04/04/2016    8:26 AM  Advanced Directives  Does Patient Have a Medical Advance Directive? Yes Yes Yes Yes Yes Yes Yes  Type of Estate agent of [employer] Power of [place];Living will Healthcare Power of [employer] of [place];Living will Living will;Healthcare Power of [employer] Power of [place];Living will Healthcare Power of [place];Living will  Does patient want to make changes to medical advance directive?       No - Patient declined  Copy of Healthcare Power of Attorney in Chart? Yes - validated most recent copy scanned in chart (See row information)  No - copy requested No - copy requested No - copy requested No - copy requested No - copy requested    Current Medications (verified) Outpatient Encounter Medications as of 07/01/2023  Medication Sig   ALPRAZolam (XANAX)  0.5 MG tablet Take 0.5 mg by mouth daily as needed for anxiety.   amLODipine (NORVASC) 10 MG tablet TAKE 1 TABLET BY MOUTH DAILY   atorvastatin (LIPITOR) 20 MG tablet TAKE 1 TABLET BY MOUTH DAILY   bacitracin-polymyxin b (POLYSPORIN) ophthalmic ointment Place 1 Application into the right eye every 12 (twelve) hours. apply to eye every 12 hours while awake   Calcium-Magnesium-Vitamin D (CALCIUM 500 PO) Take 250 tablets by mouth.   Cholecalciferol (VITAMIN D) 2000 units CAPS Take 2,000 Units by mouth daily.   fluticasone (FLONASE) 50 MCG/ACT nasal spray SHAKE LIQUID AND USE 2  SPRAYS IN EACH NOSTRIL  EVERY DAY   levothyroxine (SYNTHROID) 88 MCG tablet Take 1 tablet (88 mcg total) by mouth daily. Take 1 tablet 6 days a week and just 1/2 tablet 1 day a week; in AM on empty stomach for thyroid (name brand only DAW) (Patient taking differently: Take 88 mcg by mouth daily. Take 1 tablet daily.)   losartan (COZAAR) 100 MG tablet Take 1 tablet (100 mg total) by mouth daily.   meloxicam (MOBIC) 15 MG tablet TAKE 1 TABLET BY MOUTH DAILY   Multiple Vitamin (MULTIVITAMIN) tablet Take 1 tablet by mouth daily.   naftifine (NAFTIN) 1 % cream Apply topically daily.   nystatin-triamcinolone ointment (MYCOLOG) APPLY TOPICALLY AS NEEDED FOR SKIN IRRITATION   PREMARIN vaginal cream    WAL-FEX ALLERGY 60 MG tablet 1/2 tab po bid for allergies   No facility-administered encounter medications on  file as of 07/01/2023.    Allergies (verified) Achromycin [tetracycline], Erythromycin, and Other   History: Past Medical History:  Diagnosis Date   Abdominal bruit 04/2020   US aorta 04/2020 NO ANEURISM   Allergic rhinitis    Colon cancer screening 07/2019   FIT test NEG 07/24/19 and 10/2020.  Cologuard NEG 06/2021.   GERD (gastroesophageal reflux disease)    Heart murmur, systolic    aortic valve area, soft, no worrisome features.   History of thyroidectomy, total    Total thyroidectomy 10/29/16, [facility], surgery done due  to compressive sx's   Hx of cardiovascular stress test 04/09/2012   a. ETT + for ischemia;  b. ETT-MV: (2013) images with no ischemia, EF  77%; +ECG changes c/w ischemia.  Cath and echo normal 2017.   Hyperlipidemia    Great response to statin 2020   Hypertension    Hypothyroidism, postsurgical 10/2016   Dr. Leslie Dales follows   IBS (irritable bowel syndrome)    Osteoarthritis of both hands 2016   Both hands, wrists, R knee.  Rheum eval 11/2014 NEG (Dr. Nickola Major).  Dr. Yisroel Ramming to do bilat thumb surgery when pt ready (as of 07/2018).   Seizure disorder [facility]) 1977    Dr Ellis Savage, Neurology.One episode in 1977   Past Surgical History:  Procedure Laterality Date   BASAL CELL CARCINOMA EXCISION  03/2021   bladder tacking  1974   CARDIAC CATHETERIZATION N/A 07/03/2015   No angiographic evidence of CAD, normal LV filling pressure and systolic fxn, no pulm HTN.  Procedure: Right/Left Heart Cath and Coronary Angiography;  Surgeon: Kathleene Hazel, MD;  Location: [facility] INVASIVE CV LAB;  Service: Cardiovascular;  Laterality: N/A;   COLONOSCOPY  2003 & 2007   Dr Jarold Motto   TOTAL THYROIDECTOMY  10/2016   Path: benign multinodular goiter (Dr. Lady Gary, Surg onc at [facility]   TRANSTHORACIC ECHOCARDIOGRAM  09/24/2015   2017 Normal.  EF 60-65%.  10/31/21 EF 60-65%, mild MR   uterine polypectomy  2008    Dr Jennette Kettle   Family History  Problem Relation Age of Onset   Prostate cancer Father    Heart attack Father 57   Hypertension Mother    Thalassemia Mother    Diverticulitis Brother    Suicidality Brother    Thalassemia Maternal Aunt    Stroke Neg Hx    Diabetes Neg Hx    Social History   Socioeconomic History   Marital status: Married    Spouse name: Not on file   Number of children: 0   Years of education: Not on file   Highest education level: Not on file  Occupational History   Occupation: Retired, works part-time now for [employer]: RETIRED  Tobacco Use   Smoking status: Never    Smokeless tobacco: Never  Vaping Use   Vaping status: Never Used  Substance and Sexual Activity   Alcohol use: Yes    Alcohol/week: 1.0 standard drink of alcohol    Types: 1 Standard drinks or equivalent per week    Comment: Rarely   Drug use: No   Sexual activity: Not Currently  Other Topics Concern   Not on file  Social History Narrative   Married, husband is pt of mine Iler Lo).   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     No tobacco.   Alc: 1 drink per week.  No drugs.         Social Drivers of Dispensing optician  Resource Strain: Low Risk  (07/01/2023)   Overall Financial Resource Strain (CARDIA)    Difficulty of Paying Living Expenses: Not hard at all  Food Insecurity: No Food Insecurity (07/01/2023)   Hunger Vital Sign    Worried About Running Out of Food in the Last Year: Never true    Ran Out of Food in the Last Year: Never true  Transportation Needs: No Transportation Needs (07/01/2023)   PRAPARE - Administrator, Civil Service (Medical): No    Lack of Transportation (Non-Medical): No  Physical Activity: Sufficiently Active (07/01/2023)   Exercise Vital Sign    Days of Exercise per Week: 5 days    Minutes of Exercise per Session: 40 min  Stress: No Stress Concern Present (07/01/2023)   [employer] of Occupational Health - Occupational Stress Questionnaire    Feeling of Stress : Not at all  Social Connections: Socially Integrated (07/01/2023)   Social Connection and Isolation Panel [NHANES]    Frequency of Communication with Friends and Family: More than three times a week    Frequency of Social Gatherings with Friends and Family: More than three times a week    Attends Religious Services: More than 4 times per year    Active Member of [employer] or Organizations: Yes    Attends Engineer, structural: More than 4 times per year    Marital Status: Married    Tobacco Counseling Counseling given: Not Answered   Clinical  Intake:  Pre-visit preparation completed: Yes  Pain : No/denies pain     Diabetes: No  How often do you need to have someone help you when you read instructions, pamphlets, or other written materials from your doctor or pharmacy?: 1 - Never  Interpreter Needed?: No  Information entered by :: Remi Haggard LPN   Activities of Daily Living    07/01/2023    3:15 PM  In your present state of health, do you have any difficulty performing the following activities:  Hearing? 0  Vision? 0  Difficulty concentrating or making decisions? 0  Walking or climbing stairs? 0  Dressing or bathing? 0  Doing errands, shopping? 0  Preparing Food and eating ? N  Using the Toilet? N  In the past six months, have you accidently leaked urine? N  Do you have problems with loss of bowel control? N  Managing your Medications? N  Managing your Finances? N  Housekeeping or managing your Housekeeping? N    Patient Care Team: Jeoffrey Massed, MD as PCP - General (Family Medicine) Kathleene Hazel, MD as PCP - Cardiology (Cardiology) Freddy Finner, MD (Inactive) as Consulting Physician (Obstetrics and Gynecology) Jethro Bolus, MD as Consulting Physician (Ophthalmology) Jon Billings (Dentistry) Altheimer, Casimiro Needle, MD as Consulting Physician (Endocrinology) Duanne Guess, MD as Consulting Physician (General Surgery) Basilio Cairo, MD (Dermatology) Marcene Corning, MD as Consulting Physician (Orthopedic Surgery) Williford, Talmadge Coventry, MD as Consulting Physician (Dermatology)  Indicate any recent Medical Services you may have received from other than [facility] providers in the past year (date may be approximate).     Assessment:   This is a routine wellness examination for [place].  Hearing/Vision screen Hearing Screening - Comments:: No trouble hearing Vision Screening - Comments:: Up to date Looking for new one   Goals Addressed             This Visit's Progress    Patient Stated        Maintain current lifestyle  Depression Screen    07/01/2023    3:19 PM 11/13/2022    8:08 AM 07/10/2022    8:43 AM 06/25/2022    2:54 PM 06/19/2021    1:19 PM 05/02/2020    1:04 PM 10/05/2019    8:58 AM  PHQ 2/9 Scores  PHQ - 2 Score 2 0 0 0 1 0 1  PHQ- 9 Score 4 2         Fall Risk    07/01/2023    3:34 PM 11/13/2022    8:08 AM 06/25/2022    2:58 PM 06/25/2022    2:56 PM 06/25/2022    2:46 PM  Fall Risk   Falls in the past year? 0 0 0 0 0  Number falls in past yr: 0   0   Injury with Fall? 0  0 0   Risk for fall due to :    No Fall Risks   Follow up Falls evaluation completed;Education provided;Falls prevention discussed   Falls evaluation completed     MEDICARE RISK AT HOME: Medicare Risk at Home Any stairs in or around the home?: No If so, are there any without handrails?: No Home free of loose throw rugs in walkways, pet beds, electrical cords, etc?: Yes Adequate lighting in your home to reduce risk of falls?: Yes Life alert?: No Use of a cane, walker or w/c?: No Grab bars in the bathroom?: Yes Shower chair or bench in shower?: Yes Elevated toilet seat or a handicapped toilet?: No  TIMED UP AND GO:  Was the test performed?  No    Cognitive Function:    04/23/2018    9:04 AM  MMSE - Mini Mental State Exam  Orientation to time 5  Orientation to Place 5  Registration 3  Attention/ Calculation 3  Recall 1  Language- name 2 objects 2  Language- repeat 1  Language- follow 3 step command 3  Language- read & follow direction 1  Write a sentence 1  Copy design 1  Total score 26        07/01/2023    3:16 PM 06/25/2022    2:58 PM  6CIT Screen  What Year? 0 points   What month? 0 points   What time? 0 points 0 points  Count back from 20 0 points 0 points  Months in reverse 0 points 0 points  Repeat phrase 0 points 0 points  Total Score 0 points     Immunizations Immunization History  Administered Date(s) Administered   Fluad Quad(high Dose 65+)  03/28/2022, 03/03/2023   Influenza, High Dose Seasonal PF 04/04/2016, 02/15/2017, 02/05/2019   Influenza,inj,Quad PF,6+ Mos 03/25/2013, 03/22/2014   Influenza-Unspecified 03/24/2015, 03/04/2018, 02/05/2019, 02/21/2020, 02/15/2021   Moderna Covid-19 Fall Seasonal Vaccine 62yrs & older 03/28/2022   Moderna Sars-Covid-2 Vaccination 07/07/2019, 08/12/2019, 04/13/2020, 03/15/2021   Pneumococcal Conjugate-13 02/03/2014   Pneumococcal Polysaccharide-23 04/04/2016   Tdap 02/17/2012   Zoster Recombinant(Shingrix) 03/16/2017, 07/05/2017   Zoster, Live 12/03/2011    TDAP status: Due, Education has been provided regarding the importance of this vaccine. Advised may receive this vaccine at local pharmacy or Health Dept. Aware to provide a copy of the vaccination record if obtained from local pharmacy or Health Dept. Verbalized acceptance and understanding.  Flu Vaccine status: Up to date  Pneumococcal vaccine status: Up to date  Covid-19 vaccine status: Information provided on how to obtain vaccines.   Qualifies for Shingles Vaccine? Yes   Zostavax completed No   Shingrix Completed?: No.  Education has been provided regarding the importance of this vaccine. Patient has been advised to call insurance company to determine out of pocket expense if they have not yet received this vaccine. Advised may also receive vaccine at local pharmacy or Health Dept. Verbalized acceptance and understanding.  Screening Tests Health Maintenance  Topic Date Due   MAMMOGRAM  12/29/2023 (Originally 07/05/2021)   DTaP/Tdap/Td (2 - Td or Tdap) 06/30/2024 (Originally 02/16/2022)   Hepatitis C Screening  07/27/2024 (Originally 10/14/1968)   COVID-19 Vaccine (6 - 2024-25 season) 08/02/2024 (Originally 02/15/2023)   Fecal DNA (Cologuard)  06/26/2024   Medicare Annual Wellness (AWV)  06/30/2024   Pneumonia Vaccine 24+ Years old  Completed   INFLUENZA VACCINE  Completed   DEXA SCAN  Completed   Zoster Vaccines- Shingrix   Completed   HPV VACCINES  Aged Out   Colonoscopy  Discontinued    Health Maintenance  There are no preventive care reminders to display for this patient.   Colorectal cancer screening: Type of screening: Cologuard. Completed 2023. Repeat every 3 years  Mammogram  will schedule  Bone Density status: Completed 2017. Results reflect: Bone density results: OSTEOPENIA. Repeat every 5 years.      Education provided  Lung Cancer Screening: (Low Dose CT Chest recommended if Age 53-80 years, 20 pack-year currently smoking OR have quit w/in 15years.) does not qualify.   Lung Cancer Screening Referral:   Additional Screening:  Hepatitis C Screening:  never done  Vision Screening: Recommended annual ophthalmology exams for early detection of glaucoma and other disorders of the eye. Is the patient up to date with their annual eye exam?  Yes  Who is the provider or what is the name of the office in which the patient attends annual eye exams? Looking for a new one If pt is not established with a provider, would they like to be referred to a provider to establish care? No .   Dental Screening: Recommended annual dental exams for proper oral hygiene    Community Resource Referral / Chronic Care Management: CRR required this visit?  No   CCM required this visit?  No     Plan:     I have personally reviewed and noted the following in the patient's chart:   Medical and social history Use of alcohol, tobacco or illicit drugs  Current medications and supplements including opioid prescriptions. Patient is not currently taking opioid prescriptions. Functional ability and status Nutritional status Physical activity Advanced directives List of other physicians Hospitalizations, surgeries, and ER visits in previous 12 months Vitals Screenings to include cognitive, depression, and falls Referrals and appointments  In addition, I have reviewed and discussed with patient certain  preventive protocols, quality metrics, and best practice recommendations. A written personalized care plan for preventive services as well as general preventive health recommendations were provided to patient.     Remi Haggard, LPN   09/06/4008   After Visit Summary: (MyChart) Due to this being a telephonic visit, the after visit summary with patients personalized plan was offered to patient via MyChart   Nurse Notes:

## 2023-07-01 NOTE — Patient Instructions (Signed)
 Renee Lyons , Thank you for taking time to come for your Medicare Wellness Visit. I appreciate your ongoing commitment to your health goals. Please review the following plan we discussed and let me know if I can assist you in the future.   Screening recommendations/referrals: Colonoscopy: up to date Mammogram: Education provided Bone Density: Education provided Recommended yearly ophthalmology/optometry visit for glaucoma screening and checkup Recommended yearly dental visit for hygiene and checkup  Vaccinations: Influenza vaccine: up to date Pneumococcal vaccine: up to date Tdap vaccine: Education provided Shingles vaccine: up to date      Preventive Care 65 Years and Older, Female Preventive care refers to lifestyle choices and visits with your health care provider that can promote health and wellness. What does preventive care include? A yearly physical exam. This is also called an annual well check. Dental exams once or twice a year. Routine eye exams. Ask your health care provider how often you should have your eyes checked. Personal lifestyle choices, including: Daily care of your teeth and gums. Regular physical activity. Eating a healthy diet. Avoiding tobacco and drug use. Limiting alcohol use. Practicing safe sex. Taking low-dose aspirin  every day. Taking vitamin and mineral supplements as recommended by your health care provider. What happens during an annual well check? The services and screenings done by your health care provider during your annual well check will depend on your age, overall health, lifestyle risk factors, and family history of disease. Counseling  Your health care provider may ask you questions about your: Alcohol use. Tobacco use. Drug use. Emotional well-being. Home and relationship well-being. Sexual activity. Eating habits. History of falls. Memory and ability to understand (cognition). Work and work Astronomer. Reproductive  health. Screening  You may have the following tests or measurements: Height, weight, and BMI. Blood pressure. Lipid and cholesterol levels. These may be checked every 5 years, or more frequently if you are over 27 years old. Skin check. Lung cancer screening. You may have this screening every year starting at age 58 if you have a 30-pack-year history of smoking and currently smoke or have quit within the past 15 years. Fecal occult blood test (FOBT) of the stool. You may have this test every year starting at age 17. Flexible sigmoidoscopy or colonoscopy. You may have a sigmoidoscopy every 5 years or a colonoscopy every 10 years starting at age 33. Hepatitis C blood test. Hepatitis B blood test. Sexually transmitted disease (STD) testing. Diabetes screening. This is done by checking your blood sugar (glucose) after you have not eaten for a while (fasting). You may have this done every 1-3 years. Bone density scan. This is done to screen for osteoporosis. You may have this done starting at age 76. Mammogram. This may be done every 1-2 years. Talk to your health care provider about how often you should have regular mammograms. Talk with your health care provider about your test results, treatment options, and if necessary, the need for more tests. Vaccines  Your health care provider may recommend certain vaccines, such as: Influenza vaccine. This is recommended every year. Tetanus, diphtheria, and acellular pertussis (Tdap, Td) vaccine. You may need a Td booster every 10 years. Zoster vaccine. You may need this after age 44. Pneumococcal 13-valent conjugate (PCV13) vaccine. One dose is recommended after age 23. Pneumococcal polysaccharide (PPSV23) vaccine. One dose is recommended after age 1. Talk to your health care provider about which screenings and vaccines you need and how often you need them. This information is not  intended to replace advice given to you by your health care provider.  Make sure you discuss any questions you have with your health care provider. Document Released: 06/29/2015 Document Revised: 02/20/2016 Document Reviewed: 04/03/2015 Elsevier Interactive Patient Education  2017 [employer].  Fall Prevention in the Home Falls can cause injuries. They can happen to people of all ages. There are many things you can do to make your home safe and to help prevent falls. What can I do on the outside of my home? Regularly fix the edges of walkways and driveways and fix any cracks. Remove anything that might make you trip as you walk through a door, such as a raised step or threshold. Trim any bushes or trees on the path to your home. Use bright outdoor lighting. Clear any walking paths of anything that might make someone trip, such as rocks or tools. Regularly check to see if handrails are loose or broken. Make sure that both sides of any steps have handrails. Any raised decks and porches should have guardrails on the edges. Have any leaves, snow, or ice cleared regularly. Use sand or salt on walking paths during winter. Clean up any spills in your garage right away. This includes oil or grease spills. What can I do in the bathroom? Use night lights. Install grab bars by the toilet and in the tub and shower. Do not use towel bars as grab bars. Use non-skid mats or decals in the tub or shower. If you need to sit down in the shower, use a plastic, non-slip stool. Keep the floor dry. Clean up any water that spills on the floor as soon as it happens. Remove soap buildup in the tub or shower regularly. Attach bath mats securely with double-sided non-slip rug tape. Do not have throw rugs and other things on the floor that can make you trip. What can I do in the bedroom? Use night lights. Make sure that you have a light by your bed that is easy to reach. Do not use any sheets or blankets that are too big for your bed. They should not hang down onto the floor. Have a  firm chair that has side arms. You can use this for support while you get dressed. Do not have throw rugs and other things on the floor that can make you trip. What can I do in the kitchen? Clean up any spills right away. Avoid walking on wet floors. Keep items that you use a lot in easy-to-reach places. If you need to reach something above you, use a strong step stool that has a grab bar. Keep electrical cords out of the way. Do not use floor polish or wax that makes floors slippery. If you must use wax, use non-skid floor wax. Do not have throw rugs and other things on the floor that can make you trip. What can I do with my stairs? Do not leave any items on the stairs. Make sure that there are handrails on both sides of the stairs and use them. Fix handrails that are broken or loose. Make sure that handrails are as long as the stairways. Check any carpeting to make sure that it is firmly attached to the stairs. Fix any carpet that is loose or worn. Avoid having throw rugs at the top or bottom of the stairs. If you do have throw rugs, attach them to the floor with carpet tape. Make sure that you have a light switch at the top of the stairs  and the bottom of the stairs. If you do not have them, ask someone to add them for you. What else can I do to help prevent falls? Wear shoes that: Do not have high heels. Have rubber bottoms. Are comfortable and fit you well. Are closed at the toe. Do not wear sandals. If you use a stepladder: Make sure that it is fully opened. Do not climb a closed stepladder. Make sure that both sides of the stepladder are locked into place. Ask someone to hold it for you, if possible. Clearly mark and make sure that you can see: Any grab bars or handrails. First and last steps. Where the edge of each step is. Use tools that help you move around (mobility aids) if they are needed. These include: Canes. Walkers. Scooters. Crutches. Turn on the lights when you  go into a dark area. Replace any light bulbs as soon as they burn out. Set up your furniture so you have a clear path. Avoid moving your furniture around. If any of your floors are uneven, fix them. If there are any pets around you, be aware of where they are. Review your medicines with your doctor. Some medicines can make you feel dizzy. This can increase your chance of falling. Ask your doctor what other things that you can do to help prevent falls. This information is not intended to replace advice given to you by your health care provider. Make sure you discuss any questions you have with your health care provider. Document Released: 03/29/2009 Document Revised: 11/08/2015 Document Reviewed: 07/07/2014 Elsevier Interactive Patient Education  2017 [employer].

## 2023-08-17 DIAGNOSIS — C44719 Basal cell carcinoma of skin of left lower limb, including hip: Secondary | ICD-10-CM | POA: Diagnosis not present

## 2023-08-17 DIAGNOSIS — D485 Neoplasm of uncertain behavior of skin: Secondary | ICD-10-CM | POA: Diagnosis not present

## 2023-08-17 DIAGNOSIS — C44311 Basal cell carcinoma of skin of nose: Secondary | ICD-10-CM | POA: Diagnosis not present

## 2023-08-20 ENCOUNTER — Other Ambulatory Visit: Payer: Self-pay | Admitting: Cardiovascular Disease

## 2023-08-21 NOTE — Telephone Encounter (Signed)
 Marcelle Overlie please do referral. I assume this is a dermatology office but please clarify with pt. Unfortunately, I do not know anything about this treatment.

## 2023-09-25 DIAGNOSIS — C44311 Basal cell carcinoma of skin of nose: Secondary | ICD-10-CM | POA: Diagnosis not present

## 2023-09-25 DIAGNOSIS — C44719 Basal cell carcinoma of skin of left lower limb, including hip: Secondary | ICD-10-CM | POA: Diagnosis not present

## 2023-10-12 ENCOUNTER — Ambulatory Visit: Payer: Medicare Other | Admitting: Cardiovascular Disease

## 2023-10-14 ENCOUNTER — Ambulatory Visit: Attending: Cardiovascular Disease | Admitting: Cardiovascular Disease

## 2023-10-14 ENCOUNTER — Encounter: Payer: Self-pay | Admitting: Cardiovascular Disease

## 2023-10-14 VITALS — BP 138/68 | HR 53 | Ht 62.0 in | Wt 158.0 lb

## 2023-10-14 DIAGNOSIS — I340 Nonrheumatic mitral (valve) insufficiency: Secondary | ICD-10-CM | POA: Diagnosis not present

## 2023-10-14 DIAGNOSIS — I10 Essential (primary) hypertension: Secondary | ICD-10-CM

## 2023-10-14 NOTE — Patient Instructions (Signed)
 Medication Instructions:  No changes *If you need a refill on your cardiac medications before your next appointment, please call your pharmacy*  Lab Work: none If you have labs (blood work) drawn today and your tests are completely normal, you will receive your results only by: MyChart Message (if you have MyChart) OR A paper copy in the mail If you have any lab test that is abnormal or we need to change your treatment, we will call you to review the results.  Testing/Procedures: No changes  Follow-Up: At [facility], you and your health needs are our priority.  As part of our continuing mission to provide you with exceptional heart care, our providers are all part of one team.  This team includes your primary Cardiologist (physician) and Advanced Practice Providers or APPs (Physician Assistants and Nurse Practitioners) who all work together to provide you with the care you need, when you need it.  Your next appointment:   12 month(s)  Provider:   Antoinette Batman, MD    We recommend signing up for the patient portal called "MyChart".  Sign up information is provided on this After Visit Summary.  MyChart is used to connect with patients for Virtual Visits (Telemedicine).  Patients are able to view lab/test results, encounter notes, upcoming appointments, etc.  Non-urgent messages can be sent to your provider as well.   To learn more about what you can do with MyChart, go to [email or web address].   Other Instructions

## 2023-10-14 NOTE — Progress Notes (Signed)
 Chief Complaint  Patient presents with   Follow-up    Mitral regurgitation   History of Present Illness: 73 yo female with history of IBS, HTN, HLD, and GERD here today for cardiac follow up. She was seen in our office in January 2017 with c/o dyspnea with minimal exertion and substernal chest pain. Cardiac cath January 2017 with no evidence of CAD. Most recent echo May 2023 with LVEF=60-65%. Mild mitral regurgitation.   She is here today for follow up. The patient denies any chest pain, dyspnea, palpitations, lower extremity edema, orthopnea, PND, dizziness, near syncope or syncope.   Primary Care Physician: Shelvia Dick, MD  Past Medical History:  Diagnosis Date   Abdominal bruit 04/2020   US  aorta 04/2020 NO ANEURISM   Allergic rhinitis    Colon cancer screening 07/2019   FIT test NEG 07/24/19 and 10/2020.  Cologuard NEG 06/2021.   GERD (gastroesophageal reflux disease)    Heart murmur, systolic    aortic valve area, soft, no worrisome features.   History of thyroidectomy, total    Total thyroidectomy 10/29/16, [facility], surgery done due to compressive sx's   Hx of cardiovascular stress test 04/09/2012   a. ETT + for ischemia;  b. ETT-MV: (2013) images with no ischemia, EF  77%; +ECG changes c/w ischemia.  Cath and echo normal 2017.   Hyperlipidemia    Great response to statin 2020   Hypertension    Hypothyroidism, postsurgical 10/2016   Dr. Christobal Craft follows   IBS (irritable bowel syndrome)    Osteoarthritis of both hands 2016   Both hands, wrists, R knee.  Rheum eval 11/2014 NEG (Dr. Meredith Stalls).  Dr. Daldorf to do bilat thumb surgery when pt ready (as of 07/2018).   Seizure disorder [facility]) 1977    Dr Iline Mallory, Neurology.One episode in 1977    Past Surgical History:  Procedure Laterality Date   BASAL CELL CARCINOMA EXCISION  03/2021   bladder tacking  1974   CARDIAC CATHETERIZATION N/A 07/03/2015   No angiographic evidence of CAD, normal LV filling pressure and systolic fxn, no  pulm HTN.  Procedure: Right/Left Heart Cath and Coronary Angiography;  Surgeon: Odie Benne, MD;  Location: [facility] INVASIVE CV LAB;  Service: Cardiovascular;  Laterality: N/A;   COLONOSCOPY  2003 & 2007   Dr Adan Holms   TOTAL THYROIDECTOMY  10/2016   Path: benign multinodular goiter (Dr. Leslye Rast, Surg onc at [facility]   TRANSTHORACIC ECHOCARDIOGRAM  09/24/2015   2017 Normal.  EF 60-65%.  10/31/21 EF 60-65%, mild MR   uterine polypectomy  2008    Dr Andree Kayser    Current Outpatient Medications  Medication Sig Dispense Refill   ALPRAZolam (XANAX) 0.5 MG tablet Take 0.5 mg by mouth daily as needed for anxiety.  5   amLODipine  (NORVASC ) 10 MG tablet TAKE 1 TABLET BY MOUTH DAILY 100 tablet 0   atorvastatin  (LIPITOR) 20 MG tablet TAKE 1 TABLET BY MOUTH DAILY 30 tablet 0   bacitracin -polymyxin b  (POLYSPORIN ) ophthalmic ointment Place 1 Application into the right eye every 12 (twelve) hours. apply to eye every 12 hours while awake 3.5 g 0   Calcium -Magnesium-Vitamin D  (CALCIUM  500 PO) Take 250 tablets by mouth.     Cholecalciferol (VITAMIN D ) 2000 units CAPS Take 2,000 Units by mouth daily.     fluticasone  (FLONASE ) 50 MCG/ACT nasal spray SHAKE LIQUID AND USE 2  SPRAYS IN EACH NOSTRIL  EVERY DAY 48 g 3   levothyroxine  (SYNTHROID ) 88 MCG tablet Take 1  tablet (88 mcg total) by mouth daily. Take 1 tablet 6 days a week and just 1/2 tablet 1 day a week; in AM on empty stomach for thyroid  (name brand only DAW) (Patient taking differently: Take 88 mcg by mouth daily. Take 1 tablet daily.)     losartan  (COZAAR ) 100 MG tablet Take 1 tablet (100 mg total) by mouth daily. 90 tablet 3   meloxicam  (MOBIC ) 15 MG tablet TAKE 1 TABLET BY MOUTH DAILY 90 tablet 1   Multiple Vitamin (MULTIVITAMIN) tablet Take 1 tablet by mouth daily.     naftifine  (NAFTIN ) 1 % cream Apply topically daily. 30 g 1   nystatin -triamcinolone  ointment (MYCOLOG) APPLY TOPICALLY AS NEEDED FOR SKIN IRRITATION 30 g 1   PREMARIN vaginal cream       WAL-FEX ALLERGY  60 MG tablet 1/2 tab po bid for allergies 90 tablet 3   No current facility-administered medications for this visit.    Allergies  Allergen Reactions   Achromycin [Tetracycline] Hives   Erythromycin Rash   Other Rash    "Mycins" - hives    Social History   Socioeconomic History   Marital status: Married    Spouse name: Not on file   Number of children: 0   Years of education: Not on file   Highest education level: Not on file  Occupational History   Occupation: Retired, works part-time now for [employer]: RETIRED  Tobacco Use   Smoking status: Never   Smokeless tobacco: Never  Vaping Use   Vaping status: Never Used  Substance and Sexual Activity   Alcohol use: Yes    Alcohol/week: 1.0 standard drink of alcohol    Types: 1 Standard drinks or equivalent per week    Comment: Rarely   Drug use: No   Sexual activity: Not Currently  Other Topics Concern   Not on file  Social History Narrative   Married, husband is pt of mine Mckala Fontenot).   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     No tobacco.   Alc: 1 drink per week.  No drugs.         Social Drivers of Corporate investment banker Strain: Low Risk  (07/01/2023)   Overall Financial Resource Strain (CARDIA)    Difficulty of Paying Living Expenses: Not hard at all  Food Insecurity: No Food Insecurity (07/01/2023)   Hunger Vital Sign    Worried About Running Out of Food in the Last Year: Never true    Ran Out of Food in the Last Year: Never true  Transportation Needs: No Transportation Needs (07/01/2023)   PRAPARE - Administrator, Civil Service (Medical): No    Lack of Transportation (Non-Medical): No  Physical Activity: Sufficiently Active (07/01/2023)   Exercise Vital Sign    Days of Exercise per Week: 5 days    Minutes of Exercise per Session: 40 min  Stress: No Stress Concern Present (07/01/2023)   [employer] of Occupational Health - Occupational Stress Questionnaire     Feeling of Stress : Not at all  Social Connections: Socially Integrated (07/01/2023)   Social Connection and Isolation Panel [NHANES]    Frequency of Communication with Friends and Family: More than three times a week    Frequency of Social Gatherings with Friends and Family: More than three times a week    Attends Religious Services: More than 4 times per year    Active Member of [employer] or Organizations: Yes  Attends Banker Meetings: More than 4 times per year    Marital Status: Married  Catering manager Violence: Not At Risk (07/01/2023)   Humiliation, Afraid, Rape, and Kick questionnaire    Fear of Current or Ex-Partner: No    Emotionally Abused: No    Physically Abused: No    Sexually Abused: No    Family History  Problem Relation Age of Onset   Prostate cancer Father    Heart attack Father 35   Hypertension Mother    Thalassemia Mother    Diverticulitis Brother    Suicidality Brother    Thalassemia Maternal Aunt    Stroke Neg Hx    Diabetes Neg Hx     Review of Systems:  As stated in the HPI and otherwise negative.   BP 138/68   Pulse (!) 53   Ht 5\' 2"  (1.575 m)   Wt 71.7 kg   SpO2 99%   BMI 28.90 kg/m   Physical Examination: General: Well developed, well nourished, NAD  HEENT: OP clear, mucus membranes moist  SKIN: warm, dry. No rashes. Neuro: No focal deficits  Musculoskeletal: Muscle strength 5/5 all ext  Psychiatric: Mood and affect normal  Neck: No JVD, no carotid bruits, no thyromegaly, no lymphadenopathy.  Lungs:Clear bilaterally, no wheezes, rhonci, crackles Cardiovascular: Regular rate and rhythm. No murmurs, gallops or rubs. Abdomen:Soft. Bowel sounds present. Non-tender.  Extremities: No lower extremity edema. Pulses are 2 + in the bilateral DP/PT.  EKG:  EKG is ordered today. The ekg ordered today demonstrates  EKG Interpretation Date/Time:  Wednesday October 14 2023 09:06:58 EDT Ventricular Rate:  52 PR Interval:  174 QRS  Duration:  98 QT Interval:  440 QTC Calculation: 409 R Axis:   -21  Text Interpretation: Sinus bradycardia Poor R wave progression unchanged Confirmed by Antoinette Batman [phone]) on 10/14/2023 9:15:58 AM    Recent Labs: 11/13/2022: ALT 15 12/23/2022: Hemoglobin 11.4; Platelets 205.0 01/12/2023: BUN 21; Creatinine, Ser 0.96; Potassium 4.8; Sodium 141   Lipid Panel    Component Value Date/Time   CHOL 167 11/13/2022 0838   TRIG 56.0 11/13/2022 0838   HDL 61.70 11/13/2022 0838   CHOLHDL 3 11/13/2022 0838   VLDL 11.2 11/13/2022 0838   LDLCALC 94 11/13/2022 0838   LDLCALC 97 10/29/2020 1001   LDLDIRECT 144.3 02/17/2012 1540     Wt Readings from Last 3 Encounters:  10/14/23 71.7 kg  05/08/23 72.8 kg  11/25/22 73.8 kg    Assessment and Plan:   1. History of chest pain: Her chest pain in the past is not felt to be cardiac related. No evidence of CAD on cardiac cath in 2017  2. HTN: BP is well controlled. Continue Norvasc  and Losartan   3. Mitral regurgitation: Mild MR by echo in May 2023. Repeat echo in May 2026.   Labs/ tests ordered today include: right and left heart cath Orders Placed This Encounter  Procedures   EKG 12-Lead   Disposition:   F/U with me in 12 months  Signed, Antoinette Batman, MD 10/14/2023 9:27 AM    [facility] [address] [place], [place], [place]  [address] Phone: [phone]; Fax: [phone]

## 2023-10-21 DIAGNOSIS — H903 Sensorineural hearing loss, bilateral: Secondary | ICD-10-CM | POA: Diagnosis not present

## 2023-10-26 ENCOUNTER — Other Ambulatory Visit: Payer: Self-pay | Admitting: Family Medicine

## 2023-10-26 ENCOUNTER — Other Ambulatory Visit: Payer: Self-pay | Admitting: Cardiovascular Disease

## 2023-10-26 DIAGNOSIS — H35372 Puckering of macula, left eye: Secondary | ICD-10-CM | POA: Diagnosis not present

## 2023-10-26 DIAGNOSIS — H52223 Regular astigmatism, bilateral: Secondary | ICD-10-CM | POA: Diagnosis not present

## 2023-11-10 ENCOUNTER — Other Ambulatory Visit: Payer: Self-pay | Admitting: Family Medicine

## 2023-12-03 DIAGNOSIS — M1811 Unilateral primary osteoarthritis of first carpometacarpal joint, right hand: Secondary | ICD-10-CM | POA: Diagnosis not present

## 2023-12-03 DIAGNOSIS — M19041 Primary osteoarthritis, right hand: Secondary | ICD-10-CM | POA: Diagnosis not present

## 2023-12-09 ENCOUNTER — Ambulatory Visit: Admitting: Family Medicine

## 2023-12-09 ENCOUNTER — Encounter: Payer: Self-pay | Admitting: Family Medicine

## 2023-12-09 VITALS — BP 130/80 | HR 74 | Temp 98.6°F | Wt 160.0 lb

## 2023-12-09 DIAGNOSIS — H6993 Unspecified Eustachian tube disorder, bilateral: Secondary | ICD-10-CM | POA: Diagnosis not present

## 2023-12-09 DIAGNOSIS — J329 Chronic sinusitis, unspecified: Secondary | ICD-10-CM

## 2023-12-09 DIAGNOSIS — H9203 Otalgia, bilateral: Secondary | ICD-10-CM

## 2023-12-09 DIAGNOSIS — B9689 Other specified bacterial agents as the cause of diseases classified elsewhere: Secondary | ICD-10-CM | POA: Diagnosis not present

## 2023-12-09 LAB — POC COVID19 BINAXNOW: SARS Coronavirus 2 Ag: NEGATIVE

## 2023-12-09 MED ORDER — METHYLPREDNISOLONE ACETATE 80 MG/ML IJ SUSP
80.0000 mg | Freq: Once | INTRAMUSCULAR | Status: AC
  Administered 2023-12-09: 80 mg via INTRAMUSCULAR

## 2023-12-09 MED ORDER — AMOXICILLIN 875 MG PO TABS: 875.0000 mg | ORAL_TABLET | Freq: Two times a day (BID) | ORAL | 0 refills | Status: DC

## 2023-12-09 NOTE — Patient Instructions (Addendum)

## 2023-12-09 NOTE — Progress Notes (Signed)
 Renee Lyons , 03-Apr-1951, 73 y.o., female MRN: [id number] Patient Care Team    Relationship Specialty Notifications Start End  McGowen, Aleene DEL, MD PCP - General Family Medicine  10/12/13    Comment: Dr. Tish retirement (Merged)  Verlin Lonni BIRCH, MD PCP - Cardiology Cardiology Admissions 10/06/18   Rosalynn LELON Ingle, MD (Inactive) Consulting Physician Obstetrics and Gynecology  04/04/16   Roz Anes, MD Consulting Physician Ophthalmology  04/04/16   Jesus  Dentistry  04/04/16   Altheimer, Ozell, MD Consulting Physician Endocrinology  09/08/16   Marolyn Nest, MD Consulting Physician General Surgery  11/24/16   Mollie Greig PARAS, MD  Dermatology  04/06/17   Sheril Coy, MD Consulting Physician Orthopedic Surgery  04/23/18   Williford, Manus HERO, MD Consulting Physician Dermatology  02/12/21     Chief Complaint  Patient presents with   Ear Pain    Since Sunday, both ears. Sharp pain, chills, low-grade fever. Pt has taken tylenol.      Subjective: Renee Lyons is a 73 y.o. Pt presents for an OV with complaints of b/l ear pain of 5 days duration.  Associated symptoms include sharp stabbing pain right ear> left.. Pt has tried sweet oil, flonase  and tylenol to ease their symptoms. (See ROS)       07/01/2023    3:19 PM 11/13/2022    8:08 AM 07/10/2022    8:43 AM 06/25/2022    2:54 PM 06/19/2021    1:19 PM  Depression screen PHQ 2/9  Decreased Interest 0 0 0 0 0  Down, Depressed, Hopeless 2 0 0 0 1  PHQ - 2 Score 2 0 0 0 1  Altered sleeping 1 1     Tired, decreased energy 1 1     Change in appetite  0     Feeling bad or failure about yourself  0 0     Trouble concentrating 0 0     Moving slowly or fidgety/restless 0 0     Suicidal thoughts 0 0     PHQ-9 Score 4 2     Difficult doing work/chores Not difficult at all Not difficult at all       Allergies  Allergen Reactions   Achromycin [Tetracycline] Hives   Erythromycin Rash   Other Rash    Mycins -  hives   Social History   Social History Narrative   Married, husband is pt of mine (Lamar Lyons).   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     No tobacco.   Alc: 1 drink per week.  No drugs.         Past Medical History:  Diagnosis Date   Abdominal bruit 04/2020   US  aorta 04/2020 NO ANEURISM   Allergic rhinitis    Colon cancer screening 07/2019   FIT test NEG 07/24/19 and 10/2020.  Cologuard NEG 06/2021.   GERD (gastroesophageal reflux disease)    Heart murmur, systolic    aortic valve area, soft, no worrisome features.   History of thyroidectomy, total    Total thyroidectomy 10/29/16, [facility], surgery done due to compressive sx's   Hx of cardiovascular stress test 04/09/2012   a. ETT + for ischemia;  b. ETT-MV: (2013) images with no ischemia, EF  77%; +ECG changes c/w ischemia.  Cath and echo normal 2017.   Hyperlipidemia    Great response to statin 2020   Hypertension    Hypothyroidism, postsurgical 10/2016   Dr. Mirna follows  IBS (irritable bowel syndrome)    Osteoarthritis of both hands 2016   Both hands, wrists, R knee.  Rheum eval 11/2014 NEG (Dr. Ishmael).  Dr. Daldorf to do bilat thumb surgery when pt ready (as of 07/2018).   Seizure disorder [facility]) 1977    Dr Lajuana, Neurology.One episode in 1977   Past Surgical History:  Procedure Laterality Date   BASAL CELL CARCINOMA EXCISION  03/2021   bladder tacking  1974   CARDIAC CATHETERIZATION N/A 07/03/2015   No angiographic evidence of CAD, normal LV filling pressure and systolic fxn, no pulm HTN.  Procedure: Right/Left Heart Cath and Coronary Angiography;  Surgeon: Lonni JONETTA Cash, MD;  Location: [facility] INVASIVE CV LAB;  Service: Cardiovascular;  Laterality: N/A;   COLONOSCOPY  2003 & 2007   Dr Jakie   TOTAL THYROIDECTOMY  10/2016   Path: benign multinodular goiter (Dr. Marolyn, Surg onc [facility]   TRANSTHORACIC ECHOCARDIOGRAM  09/24/2015   2017 Normal.  EF 60-65%.  10/31/21 EF 60-65%, mild MR   uterine  polypectomy  2008    Dr Rosalynn   Family History  Problem Relation Age of Onset   Prostate cancer Father    Heart attack Father 92   Hypertension Mother    Thalassemia Mother    Diverticulitis Brother    Suicidality Brother    Thalassemia Maternal Aunt    Stroke Neg Hx    Diabetes Neg Hx    Allergies as of 12/09/2023       Reactions   Achromycin [tetracycline] Hives   Erythromycin Rash   Other Rash   Mycins - hives        Medication List        Accurate as of December 09, 2023  3:38 PM. If you have any questions, ask your nurse or doctor.          ALPRAZolam 0.5 MG tablet Commonly known as: XANAX Take 0.5 mg by mouth daily as needed for anxiety.   amLODipine  10 MG tablet Commonly known as: NORVASC  TAKE 1 TABLET BY MOUTH DAILY   amoxicillin  875 MG tablet Commonly known as: AMOXIL  Take 1 tablet (875 mg total) by mouth 2 (two) times daily for 10 days. Started by: Charlies Bellini   atorvastatin  20 MG tablet Commonly known as: LIPITOR TAKE 1 TABLET BY MOUTH DAILY   bacitracin -polymyxin b  ophthalmic ointment Commonly known as: POLYSPORIN  Place 1 Application into the right eye every 12 (twelve) hours. apply to eye every 12 hours while awake   CALCIUM  500 PO Take 250 tablets by mouth.   fluticasone  50 MCG/ACT nasal spray Commonly known as: FLONASE  SHAKE LIQUID AND USE 2  SPRAYS IN EACH NOSTRIL  EVERY DAY   levothyroxine  88 MCG tablet Commonly known as: Synthroid  Take 1 tablet (88 mcg total) by mouth daily. Take 1 tablet 6 days a week and just 1/2 tablet 1 day a week; in AM on empty stomach for thyroid  (name brand only DAW) What changed: additional instructions   losartan  100 MG tablet Commonly known as: COZAAR  Take 1 tablet (100 mg total) by mouth daily.   meloxicam  15 MG tablet Commonly known as: MOBIC  TAKE 1 TABLET BY MOUTH DAILY   multivitamin tablet Take 1 tablet by mouth daily.   naftifine  1 % cream Commonly known as: NAFTIN  Apply topically  daily.   nystatin -triamcinolone  ointment Commonly known as: MYCOLOG APPLY TOPICALLY AS NEEDED FOR SKIN IRRITATION   Premarin vaginal cream Generic drug: conjugated estrogens   Vitamin D   50 MCG (2000 UT) Caps Take 2,000 Units by mouth daily.   Wal-Fex Allergy  60 MG tablet Generic drug: fexofenadine  1/2 tab po bid for allergies        All past medical history, surgical history, allergies, family history, immunizations andmedications were updated in the EMR today and reviewed under the history and medication portions of their EMR.     Review of Systems  Constitutional:  Positive for chills and malaise/fatigue. Negative for fever.  HENT:  Positive for ear pain. Negative for congestion, sinus pain and sore throat.   Eyes: Negative.   Respiratory: Negative.    Cardiovascular: Negative.   Gastrointestinal: Negative.   Genitourinary: Negative.   Musculoskeletal:  Positive for myalgias.  Skin:  Negative for rash.  Neurological:  Negative for dizziness and headaches.   Negative, with the exception of above mentioned in HPI   Objective:  BP 130/80   Pulse 74   Temp 98.6 F (37 C)   Wt 160 lb (72.6 kg)   SpO2 98%   BMI 29.26 kg/m  Body mass index is 29.26 kg/m. Physical Exam Vitals and nursing note reviewed.  Constitutional:      General: She is not in acute distress.    Appearance: Normal appearance. She is normal weight. She is not ill-appearing or toxic-appearing.  HENT:     Head: Normocephalic and atraumatic.     Right Ear: Ear canal and external ear normal. There is no impacted cerumen.     Left Ear: Ear canal and external ear normal. There is no impacted cerumen.     Nose: Congestion and rhinorrhea present.     Right Turbinates: Enlarged and swollen. Not pale.     Left Turbinates: Not enlarged, swollen or pale.     Right Sinus: No maxillary sinus tenderness.     Left Sinus: No maxillary sinus tenderness.     Mouth/Throat:     Mouth: Mucous membranes are moist.      Pharynx: No oropharyngeal exudate or posterior oropharyngeal erythema.   Eyes:     General: No scleral icterus.       Right eye: No discharge.        Left eye: No discharge.     Extraocular Movements: Extraocular movements intact.     Conjunctiva/sclera: Conjunctivae normal.     Pupils: Pupils are equal, round, and reactive to light.    Cardiovascular:     Rate and Rhythm: Normal rate and regular rhythm.  Pulmonary:     Effort: Pulmonary effort is normal. No respiratory distress.     Breath sounds: No wheezing, rhonchi or rales.   Musculoskeletal:     Cervical back: Neck supple. No tenderness.  Lymphadenopathy:     Cervical: No cervical adenopathy.   Skin:    Findings: No rash.   Neurological:     Mental Status: She is alert and oriented to person, place, and time. Mental status is at baseline.     Motor: No weakness.     Coordination: Coordination normal.     Gait: Gait normal.   Psychiatric:        Mood and Affect: Mood normal.        Behavior: Behavior normal.        Thought Content: Thought content normal.        Judgment: Judgment normal.     No results found. No results found. Results for orders placed or performed in visit on 12/09/23 (from the past 24 hours)  POC  COVID-19 BinaxNow     Status: Normal   Collection Time: 12/09/23  9:52 AM  Result Value Ref Range   SARS Coronavirus 2 Ag Negative Negative    Assessment/Plan: Renee Lyons is a 73 y.o. female present for OV for  Ear pain, bilateral - POC COVID-19 BinaxNow  Bacterial sinusitis (Primary)/Eustachian tube dysfunction, bilateral Rest, hydrate.  Continue  flonase , and nasal saline.  Amox 875 BID prescribed, take until completed.  IM depo medrol 80 today Lymphatic massage encourage F/U 2 weeks if not improved.   Reviewed expectations re: course of current medical issues. Discussed self-management of symptoms. Outlined signs and symptoms indicating need for more acute intervention. Patient  verbalized understanding and all questions were answered. Patient received an After-Visit Summary.    Orders Placed This Encounter  Procedures   POC COVID-19 BinaxNow   Meds ordered this encounter  Medications   amoxicillin  (AMOXIL ) 875 MG tablet    Sig: Take 1 tablet (875 mg total) by mouth 2 (two) times daily for 10 days.    Dispense:  20 tablet    Refill:  0   methylPREDNISolone acetate (DEPO-MEDROL) injection 80 mg   Referral Orders  No referral(s) requested today     Note is dictated utilizing voice recognition software. Although note has been proof read prior to signing, occasional typographical errors still can be missed. If any questions arise, please do not hesitate to call for verification.   electronically signed by:  Charlies Bellini, DO  [facility] [place]

## 2023-12-21 ENCOUNTER — Encounter: Payer: Self-pay | Admitting: Family Medicine

## 2023-12-21 ENCOUNTER — Ambulatory Visit (HOSPITAL_BASED_OUTPATIENT_CLINIC_OR_DEPARTMENT_OTHER)
Admission: RE | Admit: 2023-12-21 | Discharge: 2023-12-21 | Disposition: A | Discharge status: Home / Self Care | Source: Ambulatory Visit | Attending: Family Medicine | Admitting: Family Medicine

## 2023-12-21 ENCOUNTER — Ambulatory Visit: Payer: Self-pay | Admitting: Family Medicine

## 2023-12-21 ENCOUNTER — Ambulatory Visit (INDEPENDENT_AMBULATORY_CARE_PROVIDER_SITE_OTHER): Admitting: Family Medicine

## 2023-12-21 VITALS — BP 128/66 | HR 78 | Temp 101.3°F | Ht 61.5 in | Wt 155.4 lb

## 2023-12-21 DIAGNOSIS — R5081 Fever presenting with conditions classified elsewhere: Secondary | ICD-10-CM | POA: Insufficient documentation

## 2023-12-21 DIAGNOSIS — R509 Fever, unspecified: Secondary | ICD-10-CM | POA: Diagnosis not present

## 2023-12-21 DIAGNOSIS — I10 Essential (primary) hypertension: Secondary | ICD-10-CM | POA: Diagnosis not present

## 2023-12-21 DIAGNOSIS — E7800 Pure hypercholesterolemia, unspecified: Secondary | ICD-10-CM | POA: Diagnosis not present

## 2023-12-21 DIAGNOSIS — R5381 Other malaise: Secondary | ICD-10-CM | POA: Diagnosis not present

## 2023-12-21 DIAGNOSIS — Z0000 Encounter for general adult medical examination without abnormal findings: Secondary | ICD-10-CM | POA: Diagnosis not present

## 2023-12-21 DIAGNOSIS — I700 Atherosclerosis of aorta: Secondary | ICD-10-CM | POA: Diagnosis not present

## 2023-12-21 DIAGNOSIS — R918 Other nonspecific abnormal finding of lung field: Secondary | ICD-10-CM | POA: Diagnosis not present

## 2023-12-21 DIAGNOSIS — R7401 Elevation of levels of liver transaminase levels: Secondary | ICD-10-CM

## 2023-12-21 LAB — CBC WITH DIFFERENTIAL/PLATELET
Basophils Absolute: 0.1 K/uL (ref 0.0–0.1)
Basophils Relative: 0.7 % (ref 0.0–3.0)
Eosinophils Absolute: 0 K/uL (ref 0.0–0.7)
Eosinophils Relative: 0 % (ref 0.0–5.0)
HCT: 33.8 % — ABNORMAL LOW (ref 36.0–46.0)
Hemoglobin: 11.4 g/dL — ABNORMAL LOW (ref 12.0–15.0)
Lymphocytes Relative: 32.3 % (ref 12.0–46.0)
Lymphs Abs: 2.4 K/uL (ref 0.7–4.0)
MCHC: 33.6 g/dL (ref 30.0–36.0)
MCV: 85.6 fl (ref 78.0–100.0)
Monocytes Absolute: 0.5 K/uL (ref 0.1–1.0)
Monocytes Relative: 6.1 % (ref 3.0–12.0)
Neutro Abs: 4.6 K/uL (ref 1.4–7.7)
Neutrophils Relative %: 60.9 % (ref 43.0–77.0)
Platelets: 244 K/uL (ref 150.0–400.0)
RBC: 3.95 Mil/uL (ref 3.87–5.11)
RDW: 14 % (ref 11.5–15.5)
WBC: 7.5 K/uL (ref 4.0–10.5)

## 2023-12-21 LAB — COMPREHENSIVE METABOLIC PANEL WITH GFR
ALT: 46 U/L — ABNORMAL HIGH (ref 0–35)
AST: 39 U/L — ABNORMAL HIGH (ref 0–37)
Albumin: 3.8 g/dL (ref 3.5–5.2)
Alkaline Phosphatase: 109 U/L (ref 39–117)
BUN: 18 mg/dL (ref 6–23)
CO2: 26 meq/L (ref 19–32)
Calcium: 8.8 mg/dL (ref 8.4–10.5)
Chloride: 102 meq/L (ref 96–112)
Creatinine, Ser: 0.86 mg/dL (ref 0.40–1.20)
GFR: 66.19 mL/min (ref >60.00)
Glucose, Bld: 77 mg/dL (ref 70–99)
Potassium: 4.5 meq/L (ref 3.5–5.1)
Sodium: 136 meq/L (ref 135–145)
Total Bilirubin: 0.7 mg/dL (ref 0.2–1.2)
Total Protein: 7 g/dL (ref 6.0–8.3)

## 2023-12-21 LAB — LIPID PANEL
Cholesterol: 148 mg/dL (ref 0–200)
HDL: 46.8 mg/dL (ref >39.00)
LDL Cholesterol: 82 mg/dL (ref 0–99)
NonHDL: 101.44
Total CHOL/HDL Ratio: 3
Triglycerides: 97 mg/dL (ref 0.0–149.0)
VLDL: 19.4 mg/dL (ref 0.0–40.0)

## 2023-12-21 MED ORDER — ATORVASTATIN CALCIUM 20 MG PO TABS: 20.0000 mg | ORAL_TABLET | Freq: Every day | ORAL | 3 refills | Status: DC

## 2023-12-21 MED ORDER — WAL-FEX ALLERGY 60 MG PO TABS: ORAL_TABLET | ORAL | 3 refills | Status: AC

## 2023-12-21 NOTE — Progress Notes (Signed)
 Office Note 12/21/2023  CC:  Chief Complaint  Patient presents with   Annual Exam    Pt is fasting    HPI:  Patient is a 73 y.o. female who is here for annual health maintenance exam and follow-up hypertension and hyperlipidemia.  About 4 days ago she started to feel generalized fatigue, hot/cold off and on, some mild generalized achiness.  No cough, no significant headache, no neck stiffness, no rash, minimal nasal congestion.  She was seen here 12/09/2023 for bilateral ear pain and was diagnosed with acute sinusitis.  She was given Depo-Medrol  80 mg that day and prescribed amoxicillin . Her ear pain has resolved.  She uses Flonase  and Allegra . She denies any significant sinus pressure or nasal mucus in the last several days.  No face pain. No redness of the eyes, no oral ulcers, no swelling of the joints.  After I last saw her she saw the cardiologist and they added losartan  100 mg to her medication regimen. Home blood pressures have been normal.  Past Medical History:  Diagnosis Date   Abdominal bruit 04/2020   US  aorta 04/2020 NO ANEURISM   Allergic rhinitis    Colon cancer screening 07/2019   FIT test NEG 07/24/19 and 10/2020.  Cologuard NEG 06/2021.   GERD (gastroesophageal reflux disease)    Heart murmur, systolic    aortic valve area, soft, no worrisome features.   History of thyroidectomy, total    Total thyroidectomy 10/29/16, [facility], surgery done due to compressive sx's   Hx of cardiovascular stress test 04/09/2012   a. ETT + for ischemia;  b. ETT-MV: (2013) images with no ischemia, EF  77%; +ECG changes c/w ischemia.  Cath and echo normal 2017.   Hyperlipidemia    Great response to statin 2020   Hypertension    Hypothyroidism, postsurgical 10/2016   Dr. Mirna follows   IBS (irritable bowel syndrome)    Osteoarthritis of both hands 2016   Both hands, wrists, R knee.  Rheum eval 11/2014 NEG (Dr. Ishmael).  Dr. Daldorf to do bilat thumb surgery when pt ready (as of  07/2018).   Seizure disorder [facility]) 1977    Dr Lajuana, Neurology.One episode in 1977    Past Surgical History:  Procedure Laterality Date   BASAL CELL CARCINOMA EXCISION  03/2021   bladder tacking  1974   CARDIAC CATHETERIZATION N/A 07/03/2015   No angiographic evidence of CAD, normal LV filling pressure and systolic fxn, no pulm HTN.  Procedure: Right/Left Heart Cath and Coronary Angiography;  Surgeon: Lonni JONETTA Cash, MD;  Location: [facility] INVASIVE CV LAB;  Service: Cardiovascular;  Laterality: N/A;   COLONOSCOPY  2003 & 2007   Dr Jakie   TOTAL THYROIDECTOMY  10/2016   Path: benign multinodular goiter (Dr. Marolyn, Surg onc [facility]   TRANSTHORACIC ECHOCARDIOGRAM  09/24/2015   2017 Normal.  EF 60-65%.  10/31/21 EF 60-65%, mild MR   uterine polypectomy  2008    Dr Rosalynn    Family History  Problem Relation Age of Onset   Prostate cancer Father    Heart attack Father 83   Hypertension Mother    Thalassemia Mother    Diverticulitis Brother    Suicidality Brother    Thalassemia Maternal Aunt    Stroke Neg Hx    Diabetes Neg Hx     Social History   Socioeconomic History   Marital status: Married    Spouse name: Not on file   Number of children: 0  Years of education: Not on file   Highest education level: Not on file  Occupational History   Occupation: Retired, works part-time now for [employer]: RETIRED  Tobacco Use   Smoking status: Never   Smokeless tobacco: Never  Vaping Use   Vaping status: Never Used  Substance and Sexual Activity   Alcohol use: Yes    Alcohol/week: 1.0 standard drink of alcohol    Types: 1 Standard drinks or equivalent per week    Comment: Rarely   Drug use: No   Sexual activity: Not Currently  Other Topics Concern   Not on file  Social History Narrative   Married, husband is pt of mine Daira Hine).   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     No tobacco.   Alc: 1 drink per week.  No drugs.         Social Drivers of  Corporate investment banker Strain: Low Risk  (07/01/2023)   Overall Financial Resource Strain (CARDIA)    Difficulty of Paying Living Expenses: Not hard at all  Food Insecurity: No Food Insecurity (07/01/2023)   Hunger Vital Sign    Worried About Running Out of Food in the Last Year: Never true    Ran Out of Food in the Last Year: Never true  Transportation Needs: No Transportation Needs (07/01/2023)   PRAPARE - Administrator, Civil Service (Medical): No    Lack of Transportation (Non-Medical): No  Physical Activity: Sufficiently Active (07/01/2023)   Exercise Vital Sign    Days of Exercise per Week: 5 days    Minutes of Exercise per Session: 40 min  Stress: No Stress Concern Present (07/01/2023)   [employer] of Occupational Health - Occupational Stress Questionnaire    Feeling of Stress : Not at all  Social Connections: Socially Integrated (07/01/2023)   Social Connection and Isolation Panel    Frequency of Communication with Friends and Family: More than three times a week    Frequency of Social Gatherings with Friends and Family: More than three times a week    Attends Religious Services: More than 4 times per year    Active Member of [employer] or Organizations: Yes    Attends Engineer, structural: More than 4 times per year    Marital Status: Married  Catering manager Violence: Not At Risk (07/01/2023)   Humiliation, Afraid, Rape, and Kick questionnaire    Fear of Current or Ex-Partner: No    Emotionally Abused: No    Physically Abused: No    Sexually Abused: No    Outpatient Medications Prior to Visit  Medication Sig Dispense Refill   ALPRAZolam (XANAX) 0.5 MG tablet Take 0.5 mg by mouth daily as needed for anxiety.  5   amLODipine  (NORVASC ) 10 MG tablet TAKE 1 TABLET BY MOUTH DAILY 100 tablet 2   bacitracin -polymyxin b  (POLYSPORIN ) ophthalmic ointment Place 1 Application into the right eye every 12 (twelve) hours. apply to eye every 12 hours while awake  3.5 g 0   Calcium -Magnesium-Vitamin D  (CALCIUM  500 PO) Take 250 tablets by mouth.     Cholecalciferol (VITAMIN D ) 2000 units CAPS Take 2,000 Units by mouth daily.     fluticasone  (FLONASE ) 50 MCG/ACT nasal spray SHAKE LIQUID AND USE 2  SPRAYS IN EACH NOSTRIL  EVERY DAY 48 g 3   levothyroxine  (SYNTHROID ) 88 MCG tablet Take 1 tablet (88 mcg total) by mouth daily. Take 1 tablet 6 days a week  and just 1/2 tablet 1 day a week; in AM on empty stomach for thyroid  (name brand only DAW) (Patient taking differently: Take 88 mcg by mouth daily. Take 1 tablet daily.)     losartan  (COZAAR ) 100 MG tablet Take 1 tablet (100 mg total) by mouth daily. 90 tablet 3   meloxicam  (MOBIC ) 15 MG tablet TAKE 1 TABLET BY MOUTH DAILY 90 tablet 1   Multiple Vitamin (MULTIVITAMIN) tablet Take 1 tablet by mouth daily.     naftifine  (NAFTIN ) 1 % cream Apply topically daily. 30 g 1   nystatin -triamcinolone  ointment (MYCOLOG) APPLY TOPICALLY AS NEEDED FOR SKIN IRRITATION 30 g 1   PREMARIN vaginal cream      amoxicillin  (AMOXIL ) 875 MG tablet Take 1 tablet (875 mg total) by mouth 2 (two) times daily for 10 days. 20 tablet 0   atorvastatin  (LIPITOR) 20 MG tablet TAKE 1 TABLET BY MOUTH DAILY 30 tablet 0   WAL-FEX ALLERGY  60 MG tablet 1/2 tab po bid for allergies 90 tablet 3   No facility-administered medications prior to visit.    Allergies  Allergen Reactions   Achromycin [Tetracycline] Hives   Erythromycin Rash   Other Rash    Mycins - hives    Review of Systems As above in HPI, ROS as above, plus--> no CP, no SOB, no wheezing, no dizziness, no melena/hematochezia.  No polyuria or polydipsia.    No focal weakness, paresthesias, or tremors.  No acute vision or hearing abnormalities.  No dysuria or unusual/new urinary urgency or frequency.  No recent changes in lower legs. No n/v/d or abd pain.  No palpitations.    PE;    12/21/2023   10:19 AM 12/09/2023    9:14 AM 10/14/2023    9:04 AM  Vitals with BMI  Height 5'  1.5  5' 2  Weight 155 lbs 6 oz 160 lbs 158 lbs  BMI 28.89  28.89  Systolic 128 130 861  Diastolic 66 80 68  Pulse 78 74 53   Gen: Alert, well appearing.  Patient is oriented to person, place, time, and situation. AFFECT: pleasant, lucid thought and speech. ENT: Ears: EACs clear, normal epithelium.  TMs with good light reflex and landmarks bilaterally.  Eyes: no injection, icteris, swelling, or exudate.  EOMI, PERRLA. Nose: no drainage or turbinate edema/swelling.  No injection or focal lesion.  Mouth: lips without lesion/swelling.  Oral mucosa pink and moist.  Dentition intact and without obvious caries or gingival swelling.  Oropharynx without erythema, exudate, or swelling.  Neck: supple/nontender.  No LAD, mass, or TM.  Carotid pulses 2+ bilaterally, without bruits. CV: RRR, no m/r/g.   LUNGS: CTA bilat, nonlabored resps, good aeration in all lung fields. ABD: soft, NT, ND, BS normal.  No hepatospenomegaly or mass.  No bruits. EXT: no clubbing, cyanosis, or edema.  Musculoskeletal: no joint swelling, erythema, warmth, or tenderness.  ROM of all joints intact. Skin - no sores or suspicious lesions or rashes or color changes  Pertinent labs:  Lab Results  Component Value Date   TSH 4.04 08/11/2019   Lab Results  Component Value Date   WBC 5.1 12/23/2022   HGB 11.4 (L) 12/23/2022   HCT 34.1 (L) 12/23/2022   MCV 88.0 12/23/2022   PLT 205.0 12/23/2022   Lab Results  Component Value Date   CREATININE 0.96 01/12/2023   BUN 21 01/12/2023   NA 141 01/12/2023   K 4.8 01/12/2023   CL 107 (H) 01/12/2023   CO2  23 01/12/2023   Lab Results  Component Value Date   ALT 15 11/13/2022   AST 25 11/13/2022   ALKPHOS 71 11/13/2022   BILITOT 0.5 11/13/2022   Lab Results  Component Value Date   CHOL 167 11/13/2022   Lab Results  Component Value Date   HDL 61.70 11/13/2022   Lab Results  Component Value Date   LDLCALC 94 11/13/2022   Lab Results  Component Value Date   TRIG  56.0 11/13/2022   Lab Results  Component Value Date   CHOLHDL 3 11/13/2022   Lab Results  Component Value Date   HGBA1C 5.3 07/21/2019   ASSESSMENT AND PLAN:   No problem-specific Assessment & Plan notes found for this encounter.  #1 health maintenance exam: Reviewed age and gender appropriate health maintenance issues (prudent diet, regular exercise, health risks of tobacco and excessive alcohol, use of seatbelts, fire alarms in home, use of sunscreen).  Also reviewed age and gender appropriate health screening as well as vaccine recommendations. Vaccines: all UTD. Labs: cbc, cmet, lipids Cervical ca screening: per GYN MD Breast ca screening: per GYN MD, mammog sched for August. Colon ca screening: cologuard NEG 06/2021.  Rpt 2026. Osteoporosis screening: DEXA per GYN, sched for August.  #2 fever, malaise, body aches.  Suspect viral syndrome. Rapid COVID testing today here was negative. Checking CBC today.  Will also check chest x-ray. No new medications today. Follow-up in 3 to 4 days.  #3 hypertension, well-controlled on losartan  100 mg a day and amlodipine  10 mg a day.  4.  Hypercholesterolemia, doing well on atorvastatin  20 mg a day. Lipid panel today.  An After Visit Summary was printed and given to the patient.  FOLLOW UP:  Return for 3-4 d f/u fever.  Signed:  Gerlene Hockey, MD           12/21/2023

## 2023-12-22 ENCOUNTER — Ambulatory Visit

## 2023-12-22 DIAGNOSIS — R7401 Elevation of levels of liver transaminase levels: Secondary | ICD-10-CM

## 2023-12-23 LAB — HEPATITIS B SURFACE ANTIGEN: Hepatitis B Surface Ag: NONREACTIVE

## 2023-12-23 LAB — HEPATITIS C ANTIBODY: Hepatitis C Ab: NONREACTIVE

## 2023-12-23 LAB — HEPATITIS B CORE ANTIBODY, IGM: Hep B C IgM: NONREACTIVE

## 2023-12-24 ENCOUNTER — Ambulatory Visit: Payer: Self-pay | Admitting: Family Medicine

## 2023-12-25 ENCOUNTER — Encounter: Payer: Self-pay | Admitting: Family Medicine

## 2023-12-25 ENCOUNTER — Ambulatory Visit (INDEPENDENT_AMBULATORY_CARE_PROVIDER_SITE_OTHER): Admitting: Family Medicine

## 2023-12-25 VITALS — BP 106/64 | HR 63 | Temp 98.3°F | Ht 61.5 in | Wt 156.6 lb

## 2023-12-25 DIAGNOSIS — R7401 Elevation of levels of liver transaminase levels: Secondary | ICD-10-CM | POA: Diagnosis not present

## 2023-12-25 DIAGNOSIS — B349 Viral infection, unspecified: Secondary | ICD-10-CM | POA: Diagnosis not present

## 2023-12-25 DIAGNOSIS — D649 Anemia, unspecified: Secondary | ICD-10-CM | POA: Diagnosis not present

## 2023-12-25 NOTE — Progress Notes (Signed)
 OFFICE VISIT  12/25/2023  CC:  Chief Complaint  Patient presents with   Follow-up    3-4 f/u, pt states still feeling a little tired, has started taking iron yesterday.    Patient is a 73 y.o. female who presents for 4-day follow-up fever/viral syndrome. A/P as of last visit:  fever, malaise, body aches.  Suspect viral syndrome. Rapid COVID testing today here was negative. Checking CBC today.  Will also check chest x-ray. No new medications today.  INTERIM HX: She is feeling a little better.  Mainly just still fatigued. No fevers since I last saw her. Throat feels a bit scratchy still but no other significant upper respiratory symptoms, no cough, no shortness of breath. Appetite is not really that good but the food does not taste metallic anymore.  CBC normal except hemoglobin 11.4, stable.  AST and ALT were very mildly elevated at 39 and 46 respectively.  Hepatitis B and C serology negative. Her chest x-ray on 12/21/2023 showed subsegmental atelectasis or scarring in the right middle lobe.  Otherwise normal.  Past Medical History:  Diagnosis Date   Abdominal bruit 04/2020   US  aorta 04/2020 NO ANEURISM   Allergic rhinitis    Colon cancer screening 07/2019   FIT test NEG 07/24/19 and 10/2020.  Cologuard NEG 06/2021.   GERD (gastroesophageal reflux disease)    Heart murmur, systolic    aortic valve area, soft, no worrisome features.   History of thyroidectomy, total    Total thyroidectomy 10/29/16, [facility], surgery done due to compressive sx's   Hx of cardiovascular stress test 04/09/2012   a. ETT + for ischemia;  b. ETT-MV: (2013) images with no ischemia, EF  77%; +ECG changes c/w ischemia.  Cath and echo normal 2017.   Hyperlipidemia    Great response to statin 2020   Hypertension    Hypothyroidism, postsurgical 10/2016   Dr. Mirna follows   IBS (irritable bowel syndrome)    Osteoarthritis of both hands 2016   Both hands, wrists, R knee.  Rheum eval 11/2014 NEG (Dr. Ishmael).   Dr. Daldorf to do bilat thumb surgery when pt ready (as of 07/2018).   Seizure disorder [facility]) 1977    Dr Lajuana, Neurology.One episode in 1977    Past Surgical History:  Procedure Laterality Date   BASAL CELL CARCINOMA EXCISION  03/2021   bladder tacking  1974   CARDIAC CATHETERIZATION N/A 07/03/2015   No angiographic evidence of CAD, normal LV filling pressure and systolic fxn, no pulm HTN.  Procedure: Right/Left Heart Cath and Coronary Angiography;  Surgeon: Lonni JONETTA Cash, MD;  Location: [facility] INVASIVE CV LAB;  Service: Cardiovascular;  Laterality: N/A;   COLONOSCOPY  2003 & 2007   Dr Jakie   TOTAL THYROIDECTOMY  10/2016   Path: benign multinodular goiter (Dr. Marolyn, Surg [facility]   TRANSTHORACIC ECHOCARDIOGRAM  09/24/2015   2017 Normal.  EF 60-65%.  10/31/21 EF 60-65%, mild MR   uterine polypectomy  2008    Dr Rosalynn    Outpatient Medications Prior to Visit  Medication Sig Dispense Refill   ALPRAZolam (XANAX) 0.5 MG tablet Take 0.5 mg by mouth daily as needed for anxiety.  5   amLODipine  (NORVASC ) 10 MG tablet TAKE 1 TABLET BY MOUTH DAILY 100 tablet 2   atorvastatin  (LIPITOR) 20 MG tablet Take 1 tablet (20 mg total) by mouth daily. 90 tablet 3   bacitracin -polymyxin b  (POLYSPORIN ) ophthalmic ointment Place 1 Application into the right eye every 12 (twelve)  hours. apply to eye every 12 hours while awake 3.5 g 0   Calcium -Magnesium-Vitamin D  (CALCIUM  500 PO) Take 250 tablets by mouth.     Cholecalciferol (VITAMIN D ) 2000 units CAPS Take 2,000 Units by mouth daily.     Ferrous Sulfate (IRON PO) Take by mouth daily.     fluticasone  (FLONASE ) 50 MCG/ACT nasal spray SHAKE LIQUID AND USE 2  SPRAYS IN EACH NOSTRIL  EVERY DAY 48 g 3   levothyroxine  (SYNTHROID ) 88 MCG tablet Take 1 tablet (88 mcg total) by mouth daily. Take 1 tablet 6 days a week and just 1/2 tablet 1 day a week; in AM on empty stomach for thyroid  (name brand only DAW) (Patient taking differently: Take 88 mcg by  mouth daily. Take 1 tablet daily.)     losartan  (COZAAR ) 100 MG tablet Take 1 tablet (100 mg total) by mouth daily. 90 tablet 3   meloxicam  (MOBIC ) 15 MG tablet TAKE 1 TABLET BY MOUTH DAILY 90 tablet 1   Multiple Vitamin (MULTIVITAMIN) tablet Take 1 tablet by mouth daily.     naftifine  (NAFTIN ) 1 % cream Apply topically daily. 30 g 1   nystatin -triamcinolone  ointment (MYCOLOG) APPLY TOPICALLY AS NEEDED FOR SKIN IRRITATION 30 g 1   PREMARIN vaginal cream      WAL-FEX ALLERGY  60 MG tablet 1/2 tab po bid for allergies 90 tablet 3   No facility-administered medications prior to visit.    Allergies  Allergen Reactions   Achromycin [Tetracycline] Hives   Erythromycin Rash   Other Rash    Mycins - hives    Review of Systems As per HPI  PE:    12/25/2023   11:18 AM 12/21/2023   10:19 AM 12/09/2023    9:14 AM  Vitals with BMI  Height 5' 1.5 5' 1.5   Weight 156 lbs 10 oz 155 lbs 6 oz 160 lbs  BMI 29.11 28.89   Systolic 106 128 869  Diastolic 64 66 80  Pulse 63 78 74  02 sat 98% RA today   Physical Exam  Gen: Alert, well appearing.  Patient is oriented to person, place, time, and situation. AFFECT: pleasant, lucid thought and speech. Eyes: No erythema or icterus. Throat: Pink mucosa, no swelling or erythema or exudate. Neck: No lymphadenopathy. Cardiovascular: Regular rhythm and rate with 1/6 systolic murmur.  No diastolic murmur.  No rub or gallop. Lungs are clear bilaterally, breathing nonlabored. Extremities: No edema. Skin: No rash.  LABS:  Last CBC Lab Results  Component Value Date   WBC 7.5 12/21/2023   HGB 11.4 (L) 12/21/2023   HCT 33.8 (L) 12/21/2023   MCV 85.6 12/21/2023   MCH 28.8 10/29/2020   RDW 14.0 12/21/2023   PLT 244.0 12/21/2023   Lab Results  Component Value Date   IRON 82 12/23/2022   TIBC 239 (L) 12/23/2022   FERRITIN 95 12/23/2022   Last metabolic panel Lab Results  Component Value Date   GLUCOSE 77 12/21/2023   NA 136 12/21/2023   K  4.5 12/21/2023   CL 102 12/21/2023   CO2 26 12/21/2023   BUN 18 12/21/2023   CREATININE 0.86 12/21/2023   GFR 66.19 12/21/2023   CALCIUM  8.8 12/21/2023   PROT 7.0 12/21/2023   ALBUMIN 3.8 12/21/2023   BILITOT 0.7 12/21/2023   ALKPHOS 109 12/21/2023   AST 39 (H) 12/21/2023   ALT 46 (H) 12/21/2023   Lab Results  Component Value Date   VITAMINB12 355 12/23/2022   IMPRESSION AND  PLAN:  #1 viral syndrome. Mildly elevated AST and ALT are likely associated with this. She is improving a little bit over the last several days. Signs/symptoms to call or return for were reviewed and pt expressed understanding.  #2 normocytic anemia, mild and stable. Iron panel and B12 level normal 1 year ago.  Cologuard NEG 2023. She did start an iron tab a few days ago. Okay to continue this.  Follow-up 1 month and we will repeat labs at that time.  An After Visit Summary was printed and given to the patient.  FOLLOW UP: Return in about 4 weeks (around 01/22/2024) for Follow-up viral syndrome.  Signed:  Gerlene Hockey, MD           12/25/2023

## 2023-12-28 ENCOUNTER — Ambulatory Visit: Admitting: Family Medicine

## 2023-12-31 ENCOUNTER — Other Ambulatory Visit: Payer: Self-pay | Admitting: Physician Assistant

## 2024-01-11 ENCOUNTER — Encounter: Payer: Self-pay | Admitting: Pharmacist

## 2024-01-11 NOTE — Progress Notes (Signed)
 Pharmacy Quality Measure Review  This patient is appearing on a report for being at risk of failing the adherence measure for cholesterol (statin) medications this calendar year.   Medication: atorvastatin   Last fill date: 11/12/2023 for 30 day supply per adherence report.   Reviewed recent refill history in Dr Annemarie database. Actual last refill date was 12/21/2023 for 90 day supply. Patient has 3 refills remaining. Next appointment with PCP is 089/09/2023.    Insurance report was not up to date. No action needed at this time.  Will continue to follow adherence  Madelin Ray, PharmD Clinical Pharmacist [facility]  [facility] [phone]

## 2024-01-18 ENCOUNTER — Encounter: Payer: Self-pay | Admitting: Family Medicine

## 2024-01-18 ENCOUNTER — Ambulatory Visit: Admitting: Cardiovascular Disease

## 2024-01-18 ENCOUNTER — Ambulatory Visit (INDEPENDENT_AMBULATORY_CARE_PROVIDER_SITE_OTHER): Admitting: Family Medicine

## 2024-01-18 VITALS — BP 151/74 | HR 67 | Temp 97.8°F | Ht 61.5 in | Wt 154.6 lb

## 2024-01-18 DIAGNOSIS — R7401 Elevation of levels of liver transaminase levels: Secondary | ICD-10-CM | POA: Diagnosis not present

## 2024-01-18 DIAGNOSIS — F419 Anxiety disorder, unspecified: Secondary | ICD-10-CM | POA: Diagnosis not present

## 2024-01-18 DIAGNOSIS — B349 Viral infection, unspecified: Secondary | ICD-10-CM | POA: Diagnosis not present

## 2024-01-18 LAB — HEPATIC FUNCTION PANEL
ALT: 13 U/L (ref 0–35)
AST: 21 U/L (ref 0–37)
Albumin: 4.1 g/dL (ref 3.5–5.2)
Alkaline Phosphatase: 69 U/L (ref 39–117)
Bilirubin, Direct: 0.1 mg/dL (ref 0.0–0.3)
Total Bilirubin: 0.6 mg/dL (ref 0.2–1.2)
Total Protein: 7.3 g/dL (ref 6.0–8.3)

## 2024-01-18 MED ORDER — FLUTICASONE PROPIONATE 50 MCG/ACT NA SUSP: NASAL | 3 refills | Status: AC

## 2024-01-18 MED ORDER — ALPRAZOLAM 0.5 MG PO TABS: 0.5000 mg | ORAL_TABLET | Freq: Two times a day (BID) | ORAL | 0 refills | Status: AC | PRN

## 2024-01-18 NOTE — Progress Notes (Signed)
 OFFICE VISIT  01/18/2024  CC:  Chief Complaint  Patient presents with   Follow-up    1 month f/u    Patient is a 73 y.o. female who presents for 3-week follow-up viral syndrome. A/P as of last visit: #1 viral syndrome. Mildly elevated AST and ALT are likely associated with this. She is improving a little bit over the last several days. Signs/symptoms to call or return for were reviewed and pt expressed understanding.   #2 normocytic anemia, mild and stable. Iron panel and B12 level normal 1 year ago.  Cologuard NEG 2023. She did start an iron tab a few days ago. Okay to continue this.  INTERIM HX: She is gradually feeling better and better. Says she is nearly back to her baseline. Just a little bit of gravelly feeling in the back of her throat in the mornings.  No fever, body aches, rash, headaches, cough, abdominal pain, or diarrhea.  Her appetite is good.  Past Medical History:  Diagnosis Date   Abdominal bruit 04/2020   US  aorta 04/2020 NO ANEURISM   Allergic rhinitis    Colon cancer screening 07/2019   FIT test NEG 07/24/19 and 10/2020.  Cologuard NEG 06/2021.   GERD (gastroesophageal reflux disease)    Heart murmur, systolic    aortic valve area, soft, no worrisome features.   History of thyroidectomy, total    Total thyroidectomy 10/29/16, [facility], surgery done due to compressive sx's   Hx of cardiovascular stress test 04/09/2012   a. ETT + for ischemia;  b. ETT-MV: (2013) images with no ischemia, EF  77%; +ECG changes c/w ischemia.  Cath and echo normal 2017.   Hyperlipidemia    Great response to statin 2020   Hypertension    Hypothyroidism, postsurgical 10/2016   Dr. Mirna follows   IBS (irritable bowel syndrome)    Osteoarthritis of both hands 2016   Both hands, wrists, R knee.  Rheum eval 11/2014 NEG (Dr. Ishmael).  Dr. Daldorf to do bilat thumb surgery when pt ready (as of 07/2018).   Seizure disorder [facility]) 1977    Dr Lajuana, Neurology.One episode in 1977     Past Surgical History:  Procedure Laterality Date   BASAL CELL CARCINOMA EXCISION  03/2021   bladder tacking  1974   CARDIAC CATHETERIZATION N/A 07/03/2015   No angiographic evidence of CAD, normal LV filling pressure and systolic fxn, no pulm HTN.  Procedure: Right/Left Heart Cath and Coronary Angiography;  Surgeon: Lonni JONETTA Cash, MD;  Location: [facility] INVASIVE CV LAB;  Service: Cardiovascular;  Laterality: N/A;   COLONOSCOPY  2003 & 2007   Dr Jakie   TOTAL THYROIDECTOMY  10/2016   Path: benign multinodular goiter (Dr. Marolyn, Surg onc [facility]   TRANSTHORACIC ECHOCARDIOGRAM  09/24/2015   2017 Normal.  EF 60-65%.  10/31/21 EF 60-65%, mild MR   uterine polypectomy  2008    Dr Rosalynn    Outpatient Medications Prior to Visit  Medication Sig Dispense Refill   amLODipine  (NORVASC ) 10 MG tablet TAKE 1 TABLET BY MOUTH DAILY 100 tablet 2   atorvastatin  (LIPITOR) 20 MG tablet Take 1 tablet (20 mg total) by mouth daily. 90 tablet 3   bacitracin -polymyxin b  (POLYSPORIN ) ophthalmic ointment Place 1 Application into the right eye every 12 (twelve) hours. apply to eye every 12 hours while awake 3.5 g 0   Calcium -Magnesium-Vitamin D  (CALCIUM  500 PO) Take 250 tablets by mouth.     Cholecalciferol (VITAMIN D ) 2000 units CAPS Take 2,000  Units by mouth daily.     Ferrous Sulfate (IRON PO) Take by mouth daily.     levothyroxine  (SYNTHROID ) 88 MCG tablet Take 1 tablet (88 mcg total) by mouth daily. Take 1 tablet 6 days a week and just 1/2 tablet 1 day a week; in AM on empty stomach for thyroid  (name brand only DAW) (Patient taking differently: Take 88 mcg by mouth daily. Take 1 tablet daily.)     losartan  (COZAAR ) 100 MG tablet TAKE 1 TABLET(100 MG) BY MOUTH DAILY 90 tablet 3   meloxicam  (MOBIC ) 15 MG tablet TAKE 1 TABLET BY MOUTH DAILY 90 tablet 1   Multiple Vitamin (MULTIVITAMIN) tablet Take 1 tablet by mouth daily.     naftifine  (NAFTIN ) 1 % cream Apply topically daily. 30 g 1    nystatin -triamcinolone  ointment (MYCOLOG) APPLY TOPICALLY AS NEEDED FOR SKIN IRRITATION 30 g 1   PREMARIN vaginal cream      WAL-FEX ALLERGY  60 MG tablet 1/2 tab po bid for allergies 90 tablet 3   ALPRAZolam  (XANAX ) 0.5 MG tablet Take 0.5 mg by mouth daily as needed for anxiety.  5   fluticasone  (FLONASE ) 50 MCG/ACT nasal spray SHAKE LIQUID AND USE 2  SPRAYS IN EACH NOSTRIL  EVERY DAY 48 g 3   No facility-administered medications prior to visit.    Allergies  Allergen Reactions   Achromycin [Tetracycline] Hives   Erythromycin Rash   Other Rash    Mycins - hives    Review of Systems As per HPI  PE:    01/18/2024    8:30 AM 12/25/2023   11:18 AM 12/21/2023   10:19 AM  Vitals with BMI  Height 5' 1.5 5' 1.5 5' 1.5  Weight 154 lbs 10 oz 156 lbs 10 oz 155 lbs 6 oz  BMI 28.74 29.11 28.89  Systolic 151 106 871  Diastolic 74 64 66  Pulse 67 63 78     Physical Exam  Gen: Alert, well appearing.  Patient is oriented to person, place, time, and situation. AFFECT: pleasant, lucid thought and speech. ZWU:Zbzd: no injection, icteris, swelling, or exudate.  EOMI, PERRLA. Mouth: lips without lesion/swelling.  Oral mucosa pink and moist. Oropharynx without erythema, exudate, or swelling.  Neck: small right ant cerv node, soft and mobile, nontender CV: RRR, no m/r/g.   LUNGS: CTA bilat, nonlabored resps, good aeration in all lung fields. EXT: no clubbing or cyanosis.  no edema.    LABS:  Last CBC Lab Results  Component Value Date   WBC 7.5 12/21/2023   HGB 11.4 (L) 12/21/2023   HCT 33.8 (L) 12/21/2023   MCV 85.6 12/21/2023   MCH 28.8 10/29/2020   RDW 14.0 12/21/2023   PLT 244.0 12/21/2023   Lab Results  Component Value Date   IRON 82 12/23/2022   TIBC 239 (L) 12/23/2022   FERRITIN 95 12/23/2022   Last metabolic panel Lab Results  Component Value Date   GLUCOSE 77 12/21/2023   NA 136 12/21/2023   K 4.5 12/21/2023   CL 102 12/21/2023   CO2 26 12/21/2023   BUN 18  12/21/2023   CREATININE 0.86 12/21/2023   GFR 66.19 12/21/2023   CALCIUM  8.8 12/21/2023   PROT 7.0 12/21/2023   ALBUMIN 3.8 12/21/2023   BILITOT 0.7 12/21/2023   ALKPHOS 109 12/21/2023   AST 39 (H) 12/21/2023   ALT 46 (H) 12/21/2023   Last lipids Lab Results  Component Value Date   CHOL 148 12/21/2023   HDL 46.80 12/21/2023  LDLCALC 82 12/21/2023   LDLDIRECT 144.3 02/17/2012   TRIG 97.0 12/21/2023   CHOLHDL 3 12/21/2023   Last hemoglobin A1c Lab Results  Component Value Date   HGBA1C 5.3 07/21/2019   Last thyroid  functions Lab Results  Component Value Date   TSH 4.04 08/11/2019   T3TOTAL 118.0 11/27/2016   Last vitamin D  Lab Results  Component Value Date   VD25OH 36.61 11/27/2016   Last vitamin B12 and Folate Lab Results  Component Value Date   VITAMINB12 355 12/23/2022   FOLATE > 20.0 ng/mL 05/01/2008   IMPRESSION AND PLAN:  #1 viral syndrome, patient nearly back to baseline now. Of note she started 1 iron tab daily. She had mild elevated AST and ALT in the midst of her symptoms a few weeks ago.  Will repeat hepatic panel today.  #2 episodic anxiety. Over the years she has been prescribed small amounts of alprazolam  0.5 mg by her GYN MD, Dr. Rosalynn, who has now retired. She asks if I can assume responsibility for prescribing this. Alprazolam  0.5 mg, 1 twice daily as needed, #60, no refill.  An After Visit Summary was printed and given to the patient.  FOLLOW UP: Return in about 6 months (around 07/20/2024) for routine chronic illness f/u.  Signed:  Gerlene Hockey, MD           01/18/2024

## 2024-01-19 ENCOUNTER — Ambulatory Visit: Payer: Self-pay | Admitting: Family Medicine

## 2024-02-02 DIAGNOSIS — M816 Localized osteoporosis [Lequesne]: Secondary | ICD-10-CM | POA: Diagnosis not present

## 2024-02-02 LAB — HM DEXA SCAN

## 2024-02-09 DIAGNOSIS — Z1231 Encounter for screening mammogram for malignant neoplasm of breast: Secondary | ICD-10-CM | POA: Diagnosis not present

## 2024-02-09 DIAGNOSIS — M810 Age-related osteoporosis without current pathological fracture: Secondary | ICD-10-CM | POA: Diagnosis not present

## 2024-02-22 DIAGNOSIS — M1811 Unilateral primary osteoarthritis of first carpometacarpal joint, right hand: Secondary | ICD-10-CM | POA: Diagnosis not present

## 2024-02-24 ENCOUNTER — Telehealth: Payer: Self-pay | Admitting: *Deleted

## 2024-02-24 NOTE — Telephone Encounter (Signed)
   Pre-operative Risk Assessment    Patient Name: Renee Lyons  DOB: [birth year] MRN: [id number]   Date of last office visit: 10/14/23 DR. MCALHANY Date of next office visit: NONE   Request for Surgical Clearance    Procedure:  RIGHT THUMB SUSPENSION ARTHROPLASTY AND INTERPHALANGEAL JOINT ARTHRODESIS  Date of Surgery:  Clearance 03/09/24                                Surgeon:  DR. ALM HUMMER Surgeon's Group or Practice Name:  [facility] Phone number:  [phone] ; [facility] Fax number:  [phone]   Type of Clearance Requested:   - Medical ; NONE INDICATED    Type of Anesthesia:  MAC   Additional requests/questions:    Bonney Niels Jest   02/24/2024, 3:18 PM

## 2024-02-24 NOTE — Telephone Encounter (Signed)
   Name: Renee Lyons  DOB: [birth year]  MRN: [id number]  Primary Cardiologist: Lonni Cash, MD   Preoperative team, please contact this patient and set up a phone call appointment for further preoperative risk assessment. Please obtain consent and complete medication review. Thank you for your help.  I confirm that guidance regarding antiplatelet and oral anticoagulation therapy has been completed and, if necessary, noted below.  Patient is not on anticoagulation or antiplatelet per review of medical record in Epic.    I also confirmed the patient resides in the state of [place] . As per [facility] Medical Board telemedicine laws, the patient must reside in the state in which the provider is licensed.   Lamarr Satterfield, NP 02/24/2024, 3:43 PM [facility]

## 2024-02-24 NOTE — Telephone Encounter (Signed)
 Left message for pt to call our office and ask for the preop team to schedule TELE Preop appt.

## 2024-02-25 ENCOUNTER — Telehealth: Payer: Self-pay | Admitting: *Deleted

## 2024-02-25 ENCOUNTER — Telehealth: Payer: Self-pay

## 2024-02-25 DIAGNOSIS — L820 Inflamed seborrheic keratosis: Secondary | ICD-10-CM | POA: Diagnosis not present

## 2024-02-25 DIAGNOSIS — D239 Other benign neoplasm of skin, unspecified: Secondary | ICD-10-CM | POA: Diagnosis not present

## 2024-02-25 DIAGNOSIS — Z85828 Personal history of other malignant neoplasm of skin: Secondary | ICD-10-CM | POA: Diagnosis not present

## 2024-02-25 NOTE — Telephone Encounter (Signed)
 Pt has been scheduled tele preop appt 03/03/24. Med rec and consent are done.

## 2024-02-25 NOTE — Telephone Encounter (Signed)
 Form completed.

## 2024-02-25 NOTE — Telephone Encounter (Signed)
 Pt has been scheduled tele preop appt 03/03/24. Med rec and consent are done.      Patient Consent for Virtual Visit        Renee Lyons has provided verbal consent on 02/25/2024 for a virtual visit (video or telephone).   CONSENT FOR VIRTUAL VISIT FOR:  Renee Lyons Agent  By participating in this virtual visit I agree to the following:  I hereby voluntarily request, consent and authorize [facility] and its employed or contracted physicians, physician assistants, nurse practitioners or other licensed health care professionals (the Practitioner), to provide me with telemedicine health care services (the "Services) as deemed necessary by the treating Practitioner. I acknowledge and consent to receive the Services by the Practitioner via telemedicine. I understand that the telemedicine visit will involve communicating with the Practitioner through live audiovisual communication technology and the disclosure of certain medical information by electronic transmission. I acknowledge that I have been given the opportunity to request an in-person assessment or other available alternative prior to the telemedicine visit and am voluntarily participating in the telemedicine visit.  I understand that I have the right to withhold or withdraw my consent to the use of telemedicine in the course of my care at any time, without affecting my right to future care or treatment, and that the Practitioner or I may terminate the telemedicine visit at any time. I understand that I have the right to inspect all information obtained and/or recorded in the course of the telemedicine visit and may receive copies of available information for a reasonable fee.  I understand that some of the potential risks of receiving the Services via telemedicine include:  Delay or interruption in medical evaluation due to technological equipment failure or disruption; Information transmitted may not be sufficient (e.g. poor resolution of  images) to allow for appropriate medical decision making by the Practitioner; and/or  In rare instances, security protocols could fail, causing a breach of personal health information.  Furthermore, I acknowledge that it is my responsibility to provide information about my medical history, conditions and care that is complete and accurate to the best of my ability. I acknowledge that Practitioner's advice, recommendations, and/or decision may be based on factors not within their control, such as incomplete or inaccurate data provided by me or distortions of diagnostic images or specimens that may result from electronic transmissions. I understand that the practice of medicine is not an exact science and that Practitioner makes no warranties or guarantees regarding treatment outcomes. I acknowledge that a copy of this consent can be made available to me via my patient portal [facility] MyChart), or I can request a printed copy by calling the office of [facility].    I understand that my insurance will be billed for this visit.   I have read or had this consent read to me. I understand the contents of this consent, which adequately explains the benefits and risks of the Services being provided via telemedicine.  I have been provided ample opportunity to ask questions regarding this consent and the Services and have had my questions answered to my satisfaction. I give my informed consent for the services to be provided through the use of telemedicine in my medical care

## 2024-02-25 NOTE — Telephone Encounter (Signed)
 Form faxed for Surgical Clearance, to be filled out by provider. Surgery date is 03/09/24. [facility] requested to send it back via Fax within 7-days. Patient had CPE appt on 12/29/23. Document is located office.Please advise at [phone] (mobile)

## 2024-03-03 ENCOUNTER — Ambulatory Visit: Attending: Cardiology | Admitting: Nurse Practitioner

## 2024-03-03 DIAGNOSIS — Z01810 Encounter for preprocedural cardiovascular examination: Secondary | ICD-10-CM | POA: Diagnosis not present

## 2024-03-03 NOTE — Progress Notes (Signed)
 Virtual Visit via Telephone Note   Because of Renee Lyons co-morbid illnesses, she is at least at moderate risk for complications without adequate follow up.  This format is felt to be most appropriate for this patient at this time.  Due to technical limitations with video connection (technology), today's appointment will be conducted as an audio only telehealth visit, and Renee Lyons verbally agreed to proceed in this manner.   All issues noted in this document were discussed and addressed.  No physical exam could be performed with this format.  Evaluation Performed:  Preoperative cardiovascular risk assessment _____________   Date:  03/03/2024   Patient ID:  Renee Lyons, DOB 08-27-1950, MRN [id number] Patient Location:  Home Provider location:   Office  Primary Care Provider:  Candise Aleene DEL, MD Primary Cardiologist:  Renee Cash, MD  Chief Complaint / Patient Profile   73 y.o. y/o female with a h/o chest pain without evidence of CAD, mild mitral valve regurgitation, hypertension, hyperlipidemia, hypothyroidism, and GERD who is pending RIGHT THUMB SUSPENSION ARTHROPLASTY AND INTERPHALANGEAL JOINT ARTHRODESIS on 03/09/2024 with Dr. Alm Hummer of [facility] and presents today for telephonic preoperative cardiovascular risk assessment.  History of Present Illness    Renee Lyons is a 73 y.o. female who presents via audio/video conferencing for a telehealth visit today.  Pt was last seen in cardiology clinic on 10/14/2023 by Dr. Cash. At that time Renee Lyons was doing well.  The patient is now pending procedure as outlined above.Since her last visit, she has done well from a cardiac standpoint.   She denies chest pain, palpitations, dyspnea, pnd, orthopnea, n, v, dizziness, syncope, edema, weight gain, or early satiety. All other systems reviewed and are otherwise negative except as noted above.   Past Medical History    Past Medical History:  Diagnosis Date    Abdominal bruit 04/2020   US  aorta 04/2020 NO ANEURISM   Allergic rhinitis    Anxiety    Colon cancer screening 07/2019   FIT test NEG 07/24/19 and 10/2020.  Cologuard NEG 06/2021.   GERD (gastroesophageal reflux disease)    Heart murmur, systolic    aortic valve area, soft, no worrisome features.   History of thyroidectomy, total    Total thyroidectomy 10/29/16, [facility], surgery done due to compressive sx's   Hx of cardiovascular stress test 04/09/2012   a. ETT + for ischemia;  b. ETT-MV: (2013) images with no ischemia, EF  77%; +ECG changes c/w ischemia.  Cath and echo normal 2017.   Hyperlipidemia    Great response to statin 2020   Hypertension    Hypothyroidism, postsurgical 10/2016   Dr. Mirna follows   IBS (irritable bowel syndrome)    Osteoarthritis of both hands 2016   Both hands, wrists, R knee.  Rheum eval 11/2014 NEG (Dr. Ishmael).  Dr. Daldorf to do bilat thumb surgery when pt ready (as of 07/2018).   Seizure disorder [facility]) 1977    Dr Lajuana, Neurology.One episode in 1977   Past Surgical History:  Procedure Laterality Date   BASAL CELL CARCINOMA EXCISION  03/2021   bladder tacking  1974   CARDIAC CATHETERIZATION N/A 07/03/2015   No angiographic evidence of CAD, normal LV filling pressure and systolic fxn, no pulm HTN.  Procedure: Right/Left Heart Cath and Coronary Angiography;  Surgeon: Renee JONETTA Cash, MD;  Location: [facility] INVASIVE CV LAB;  Service: Cardiovascular;  Laterality: N/A;   COLONOSCOPY  2003 & 2007  Dr Jakie   TOTAL THYROIDECTOMY  10/2016   Path: benign multinodular goiter (Dr. Marolyn, Surg onc [facility]   TRANSTHORACIC ECHOCARDIOGRAM  09/24/2015   2017 Normal.  EF 60-65%.  10/31/21 EF 60-65%, mild MR   uterine polypectomy  2008    Dr Rosalynn    Allergies  Allergies  Allergen Reactions   Achromycin [Tetracycline] Hives   Erythromycin Rash   Other Rash    Mycins - hives    Home Medications    Prior to Admission medications   Medication Sig Start  Date End Date Taking? Authorizing Provider  ALPRAZolam  (XANAX ) 0.5 MG tablet Take 1 tablet (0.5 mg total) by mouth 2 (two) times daily as needed for anxiety. 01/18/24   McGowen, Aleene DEL, MD  amLODipine  (NORVASC ) 10 MG tablet TAKE 1 TABLET BY MOUTH DAILY 10/26/23   Verlin Renee BIRCH, MD  atorvastatin  (LIPITOR) 20 MG tablet Take 1 tablet (20 mg total) by mouth daily. 12/21/23   McGowen, Aleene DEL, MD  bacitracin -polymyxin b  (POLYSPORIN ) ophthalmic ointment Place 1 Application into the right eye every 12 (twelve) hours. apply to eye every 12 hours while awake 05/08/23   Crain, [place] L, PA  Calcium -Magnesium-Vitamin D  (CALCIUM  500 PO) Take 250 tablets by mouth. Patient not taking: Reported on 02/25/2024    [provider]  Cholecalciferol (VITAMIN D ) 2000 units CAPS Take 2,000 Units by mouth daily.    [provider]  Ferrous Sulfate (IRON PO) Take by mouth daily.    [provider]  fluticasone  (FLONASE ) 50 MCG/ACT nasal spray SHAKE LIQUID AND USE 2 SPRAYS IN EACH NOSTRIL EVERY DAY 01/18/24   McGowen, Aleene DEL, MD  levothyroxine  (SYNTHROID ) 88 MCG tablet Take 1 tablet (88 mcg total) by mouth daily. Take 1 tablet 6 days a week and just 1/2 tablet 1 day a week; in AM on empty stomach for thyroid  (name brand only DAW) Patient taking differently: Take 88 mcg by mouth daily. Take 1 tablet daily. 10/03/19   McGowen, Aleene DEL, MD  losartan  (COZAAR ) 100 MG tablet TAKE 1 TABLET(100 MG) BY MOUTH DAILY 12/31/23   Verlin Renee BIRCH, MD  meloxicam  (MOBIC ) 15 MG tablet TAKE 1 TABLET BY MOUTH DAILY Patient not taking: Reported on 02/25/2024 09/01/22   McGowen, Philip H, MD  Multiple Vitamin (MULTIVITAMIN) tablet Take 1 tablet by mouth daily.    [provider]  naftifine  (NAFTIN ) 1 % cream Apply topically daily. 08/06/22   McGowen, Aleene DEL, MD  nystatin -triamcinolone  ointment (MYCOLOG) APPLY TOPICALLY AS NEEDED FOR SKIN IRRITATION 07/10/22   McGowen, Aleene DEL, MD  PREMARIN  vaginal cream     [provider]  WAL-FEX ALLERGY  60 MG tablet 1/2 tab po bid for allergies 12/21/23   McGowen, Aleene DEL, MD    Physical Exam    Vital Signs:  Renee Lyons does not have vital signs available for review today.  Given telephonic nature of communication, physical exam is limited. AAOx3. NAD. Normal affect.  Speech and respirations are unlabored.  Accessory Clinical Findings    None  Assessment & Plan    1.  Preoperative Cardiovascular Risk Assessment:  According to the Revised Cardiac Risk Index (RCRI), her Perioperative Risk of Major Cardiac Event is (%): 0.4. Her Functional Capacity in METs is: 7.25 according to the Duke Activity Status Index (DASI).Therefore, based on ACC/AHA guidelines, patient would be at acceptable risk for the planned procedure without further cardiovascular testing.  The patient was advised that if she develops new  symptoms prior to surgery to contact our office to arrange for a follow-up visit, and she verbalized understanding.  A copy of this note will be routed to requesting surgeon.  Time:   Today, I have spent 6 minutes with the patient with telehealth technology discussing medical history, symptoms, and management plan.     Damien JAYSON Braver, NP  03/03/2024, 8:49 AM

## 2024-03-04 ENCOUNTER — Telehealth: Payer: Self-pay

## 2024-03-04 NOTE — Telephone Encounter (Signed)
   Pre-operative Risk Assessment    Patient Name: Renee Lyons  DOB: April 03, 1951 MRN: [id number]   Date of last office visit: 10/14/23 Date of next office visit: none   Request for Surgical Clearance    Procedure:  Right thumb suspension arthoplasty and interphalangeal joint arthrodesis  Date of Surgery:  Clearance 03/09/24                                 Surgeon:  Alm Hummer, MD Surgeon's Group or Practice Name:  [facility]  Phone number:  [phone] Fax number:  [phone]   Type of Clearance Requested:   - Medical    Type of Anesthesia:  MAC   Additional requests/questions:  N/A  SignedMerlynn LITTIE Essex   03/04/2024, 10:22 AM

## 2024-03-09 DIAGNOSIS — M1811 Unilateral primary osteoarthritis of first carpometacarpal joint, right hand: Secondary | ICD-10-CM | POA: Diagnosis not present

## 2024-03-09 DIAGNOSIS — M19231 Secondary osteoarthritis, right wrist: Secondary | ICD-10-CM | POA: Diagnosis not present

## 2024-03-09 DIAGNOSIS — M19041 Primary osteoarthritis, right hand: Secondary | ICD-10-CM | POA: Diagnosis not present

## 2024-03-22 NOTE — Progress Notes (Signed)
 Pharmacy Quality Measure Review  This patient is appearing on a report for being at risk of failing the adherence measure for cholesterol (statin) medications this calendar year.   Medication: atorvastatin  20 mg daily Last fill date: 02/22/2024 for 100 day supply  Insurance report was not up to date. No action needed at this time.   Woodie Jock, PharmD PGY1 Pharmacy Resident  03/22/2024

## 2024-03-24 ENCOUNTER — Encounter: Payer: Self-pay | Admitting: Family Medicine

## 2024-03-24 MED ORDER — CICLOPIROX 8 % EX SOLN: Freq: Every day | CUTANEOUS | 0 refills | Status: AC

## 2024-03-25 DIAGNOSIS — E890 Postprocedural hypothyroidism: Secondary | ICD-10-CM | POA: Diagnosis not present

## 2024-03-31 DIAGNOSIS — E890 Postprocedural hypothyroidism: Secondary | ICD-10-CM | POA: Diagnosis not present

## 2024-04-12 DIAGNOSIS — M1811 Unilateral primary osteoarthritis of first carpometacarpal joint, right hand: Secondary | ICD-10-CM | POA: Diagnosis not present

## 2024-05-16 ENCOUNTER — Encounter: Payer: Self-pay | Admitting: Family Medicine

## 2024-07-03 ENCOUNTER — Other Ambulatory Visit: Payer: Self-pay | Admitting: Family Medicine

## 2024-08-03 ENCOUNTER — Ambulatory Visit

## 2024-10-11 ENCOUNTER — Ambulatory Visit: Admitting: Cardiovascular Disease

## 2024-10-19 ENCOUNTER — Ambulatory Visit
